# Patient Record
Sex: Female | Born: 1955 | Race: Black or African American | Hispanic: No | Smoking: Former smoker
Health system: Southern US, Community
[De-identification: ages and names within clinical notes are randomized; demographics above are authoritative.]

## PROBLEM LIST (undated history)

## (undated) DIAGNOSIS — S32000A Wedge compression fracture of unspecified lumbar vertebra, initial encounter for closed fracture: Secondary | ICD-10-CM

## (undated) DIAGNOSIS — I10 Essential (primary) hypertension: Secondary | ICD-10-CM

## (undated) DIAGNOSIS — C801 Malignant (primary) neoplasm, unspecified: Secondary | ICD-10-CM

## (undated) HISTORY — DX: Essential (primary) hypertension: I10

## (undated) HISTORY — DX: Wedge compression fracture of unspecified lumbar vertebra, initial encounter for closed fracture: S32.000A

---

## 1999-03-12 ENCOUNTER — Other Ambulatory Visit: Admission: RE | Admit: 1999-03-12 | Discharge: 1999-03-12 | Payer: Self-pay | Admitting: Family Medicine

## 1999-03-12 ENCOUNTER — Ambulatory Visit (HOSPITAL_COMMUNITY): Admission: RE | Admit: 1999-03-12 | Discharge: 1999-03-12 | Payer: Self-pay | Admitting: Family Medicine

## 2005-08-15 ENCOUNTER — Other Ambulatory Visit: Admission: RE | Admit: 2005-08-15 | Discharge: 2005-08-15 | Payer: Self-pay | Admitting: Family Medicine

## 2006-01-13 ENCOUNTER — Ambulatory Visit (HOSPITAL_COMMUNITY): Admission: RE | Admit: 2006-01-13 | Discharge: 2006-01-14 | Payer: Self-pay | Admitting: Neurosurgery

## 2006-12-15 ENCOUNTER — Other Ambulatory Visit: Admission: RE | Admit: 2006-12-15 | Discharge: 2006-12-15 | Payer: Self-pay | Admitting: Family Medicine

## 2007-02-18 HISTORY — PX: BACK SURGERY: SHX140

## 2007-12-21 ENCOUNTER — Ambulatory Visit (HOSPITAL_COMMUNITY): Admission: RE | Admit: 2007-12-21 | Discharge: 2007-12-21 | Payer: Self-pay | Admitting: Obstetrics

## 2010-07-04 ENCOUNTER — Other Ambulatory Visit (HOSPITAL_COMMUNITY)
Admission: RE | Admit: 2010-07-04 | Discharge: 2010-07-04 | Disposition: A | Discharge status: Home / Self Care | Payer: BC Managed Care – PPO | Source: Ambulatory Visit | Attending: Family Medicine | Admitting: Family Medicine

## 2010-07-04 DIAGNOSIS — Z01419 Encounter for gynecological examination (general) (routine) without abnormal findings: Secondary | ICD-10-CM | POA: Insufficient documentation

## 2010-07-04 DIAGNOSIS — Z1159 Encounter for screening for other viral diseases: Secondary | ICD-10-CM | POA: Insufficient documentation

## 2010-07-05 NOTE — Op Note (Signed)
NAMERYDER, CHESMORE                  ACCOUNT NO.:  [id number]   MEDICAL RECORD NO.:  [id number]          PATIENT TYPE:  AMB   LOCATION:  SDS                          FACILITY:  [facility]   PHYSICIAN:  Clydene Fake, M.D.  DATE OF BIRTH:  06/24/1955   DATE OF PROCEDURE:  01/13/2006  DATE OF DISCHARGE:                                 OPERATIVE REPORT   PREOPERATIVE DIAGNOSIS:  Herniated nucleus pulposus, spondylosis, right L5-  S1.   POSTOPERATIVE DIAGNOSIS:  Herniated nucleus pulposus, spondylosis, right L5-  S1.   PROCEDURE:  Right L5-S1 semihemilaminectomy and diskectomy, microdissection  with a microscope.   SURGEON:  Clydene Fake, M.D.   ASSISTANT:  Hewitt Shorts, M.D.   ANESTHESIA:  General endotracheal tube anesthesia.   ESTIMATED BLOOD LOSS:  Minimal.   BLOOD GIVEN:  None.   DRAINS:  None.   COMPLICATIONS:  None.   REASON FOR PROCEDURE:  The patient is a 55 year old, right-handed woman, who  has been having on-and-off back and right leg pain since March.  Over the  last month or so, it has really gotten worse.  An MRI was done, showing a  large disk herniation, right, L5-S1, compressing the S1 root, on top of some  spondylytic changes.  She has some decreased sensation in the right L5 and  S1 distributions to light touch and pinprick, worse at S1, with absence of  right ankle reflex.  The patient was brought in for decompression and  diskectomy.   PROCEDURE IN DETAIL:  The patient was brought into the operating room.  General anesthesia was induced.  The patient was placed in prone position on  the Wilson frame with all pressure points padded.  The patient was prepped  and draped in a sterile fashion and the site of incision was injected with  10 cc of 1% lidocaine with epinephrine.  A needle was placed in the  interspace and x-ray was obtained, showing this was pointing towards the 5-1  disk space, but in between the 4 and 5 spinous processes.  An incision  was  then made and centered below where the needle was, and the incision taken  down to the fascia and hemostasis obtained with Bovie cauterization.  The  fascia was incised with the Bovie and subperiosteal dissection was done over  the S1 and L5 spinous processes and laminae out to the facets.  A self-  retaining retractor was placed.  A marker was placed at the interspace.  Another x-ray was obtained, confirming our positioning at L5-S1.  The  microscope was brought in for microdissection, and at this point a high-  speed drill was used to start a semihemilaminectomy and medial facetectomy.  This was completed with Kerrison punches.  The ligamentum flavum, which was  very thickened, was removed, exposing the dura and the S1 nerve roots, which  were both pushed posteriorly.  At this point, in the epidural space we found  a large subligamentous disk herniation with pieces of disk protruding out  from this.  We entered the disk space and did  diskectomy, and in this way  loosened up the S1 nerve root.  We explored superior to the disk space and  found a few fragments that were pushed up into the L5 roots, superior to the  disk space.  After we removed that, we had good decompression of the area.  We used curets to push the osteophytes and disk material down from the  chronic spondylytic disk bulge.  We used the osteophyte tool and pituitary  rongeurs to continue the diskectomy and decompression.  When we were  finished, we had good decompression of the S1 and L5 nerve roots.  They went  out their foramina well.  We still could feel a small ridge starting at the  midline, but we did not progress further to try to decompress this, since we  really could not visualize any further.  We irrigated with antibiotic  solution and we had good hemostasis.  The retractor was removed and the  fascia closed with 0 Vicryl interrupted sutures.  The subcutaneous tissue  was closed with 0, 2-0 and 3-0 Vicryl  interrupted sutures.  The skin was  closed with benzoin and Steri-Strips.  A dressing was placed.  The patient  was placed back in a supine position, awakened from anesthesia and  transferred to the recovery room in stable condition.           ______________________________  Clydene Fake, M.D.     JRH/MEDQ  D:  01/13/2006  T:  01/13/2006  Job:  [id number]

## 2017-04-02 ENCOUNTER — Other Ambulatory Visit: Payer: Self-pay | Admitting: Physician Assistant

## 2017-04-02 DIAGNOSIS — R9389 Abnormal findings on diagnostic imaging of other specified body structures: Secondary | ICD-10-CM

## 2017-04-21 ENCOUNTER — Ambulatory Visit
Admission: RE | Admit: 2017-04-21 | Discharge: 2017-04-21 | Disposition: A | Discharge status: Home / Self Care | Source: Ambulatory Visit | Attending: Physician Assistant | Admitting: Physician Assistant

## 2017-04-21 DIAGNOSIS — R9389 Abnormal findings on diagnostic imaging of other specified body structures: Secondary | ICD-10-CM

## 2017-04-22 ENCOUNTER — Other Ambulatory Visit (HOSPITAL_COMMUNITY): Payer: Self-pay | Admitting: Physician Assistant

## 2017-04-22 DIAGNOSIS — R918 Other nonspecific abnormal finding of lung field: Secondary | ICD-10-CM

## 2017-04-27 ENCOUNTER — Other Ambulatory Visit: Payer: Self-pay | Admitting: Radiology

## 2017-04-28 ENCOUNTER — Ambulatory Visit (HOSPITAL_COMMUNITY)
Admission: RE | Admit: 2017-04-28 | Discharge: 2017-04-28 | Disposition: A | Discharge status: Home / Self Care | Source: Ambulatory Visit | Attending: Interventional Radiology | Admitting: Interventional Radiology

## 2017-04-28 ENCOUNTER — Ambulatory Visit (HOSPITAL_COMMUNITY)
Admission: RE | Admit: 2017-04-28 | Discharge: 2017-04-28 | Disposition: A | Discharge status: Home / Self Care | Source: Ambulatory Visit | Attending: Physician Assistant | Admitting: Physician Assistant

## 2017-04-28 ENCOUNTER — Encounter (HOSPITAL_COMMUNITY): Payer: Self-pay

## 2017-04-28 DIAGNOSIS — R918 Other nonspecific abnormal finding of lung field: Secondary | ICD-10-CM

## 2017-04-28 DIAGNOSIS — C3411 Malignant neoplasm of upper lobe, right bronchus or lung: Secondary | ICD-10-CM | POA: Diagnosis not present

## 2017-04-28 DIAGNOSIS — J95811 Postprocedural pneumothorax: Secondary | ICD-10-CM

## 2017-04-28 DIAGNOSIS — Z9889 Other specified postprocedural states: Secondary | ICD-10-CM | POA: Diagnosis not present

## 2017-04-28 DIAGNOSIS — F17200 Nicotine dependence, unspecified, uncomplicated: Secondary | ICD-10-CM | POA: Insufficient documentation

## 2017-04-28 LAB — CBC
HEMATOCRIT: 35.5 % — AB (ref 36.0–46.0)
HEMOGLOBIN: 11.6 g/dL — AB (ref 12.0–15.0)
MCH: 27.4 pg (ref 26.0–34.0)
MCHC: 32.7 g/dL (ref 30.0–36.0)
MCV: 83.9 fL (ref 78.0–100.0)
Platelets: 397 10*3/uL (ref 150–400)
RBC: 4.23 MIL/uL (ref 3.87–5.11)
RDW: 16.7 % — ABNORMAL HIGH (ref 11.5–15.5)
WBC: 11.6 10*3/uL — ABNORMAL HIGH (ref 4.0–10.5)

## 2017-04-28 LAB — PROTIME-INR
INR: 1.13
Prothrombin Time: 14.4 seconds (ref 11.4–15.2)

## 2017-04-28 MED ORDER — FENTANYL CITRATE (PF) 100 MCG/2ML IJ SOLN
INTRAMUSCULAR | Status: AC | PRN
  Administered 2017-04-28: 50 ug via INTRAVENOUS

## 2017-04-28 MED ORDER — FENTANYL CITRATE (PF) 100 MCG/2ML IJ SOLN
INTRAMUSCULAR | Status: AC
  Filled 2017-04-28: qty 2

## 2017-04-28 MED ORDER — SODIUM CHLORIDE 0.9 % IV SOLN: INTRAVENOUS | Status: DC

## 2017-04-28 MED ORDER — MIDAZOLAM HCL 2 MG/2ML IJ SOLN
INTRAMUSCULAR | Status: AC
  Filled 2017-04-28: qty 2

## 2017-04-28 MED ORDER — LIDOCAINE HCL 1 % IJ SOLN
INTRAMUSCULAR | Status: AC
  Filled 2017-04-28: qty 20

## 2017-04-28 MED ORDER — MIDAZOLAM HCL 2 MG/2ML IJ SOLN
INTRAMUSCULAR | Status: AC | PRN
  Administered 2017-04-28: 1 mg via INTRAVENOUS

## 2017-04-28 NOTE — H&P (Signed)
Chief Complaint: Patient was seen in consultation today for right upper lobe lung mass at the request of [place]  Referring Physician(s): McKean  Supervising Physician: Corrie Mckusick  Patient Status: [name] - Out-pt  History of Present Illness: Ashley Wilson is a 62 y.o. female   Current smoker since age 28 Was seen by PCP for swelling of the hands, was noted to have clubbing nails Work ensued  Hx right upper lobe lung mass found on CT chest 04/21/2017: 8.9 cm a lobulated right apical/upper lobe mass most compatible with primary lung cancer. Mild mediastinal adenopathy. These findings could be further evaluated with PET CT and/or tissue diagnosis. 7.6 cm cystic lesion off the upper pole of the left kidney, likely a benign minimally complex cyst but this cannot be fully characterized without intravenous contrast.  Plan for image guided right upper lobe lung mass biopsy per request of Dr. [name].  History reviewed. No pertinent past medical history.  History reviewed. No pertinent surgical history.  Allergies: Patient has no known allergies.  Medications: Prior to Admission medications   Medication Sig Start Date End Date Taking? Authorizing Provider  acetaminophen (TYLENOL) 500 MG tablet Take 1,000 mg by mouth every 6 (six) hours as needed for mild pain or moderate pain.   Yes [provider]  amLODipine (NORVASC) 10 MG tablet Take 10 mg by mouth daily.   Yes [provider]  cetirizine (ZYRTEC) 10 MG tablet Take 10 mg by mouth daily as needed for allergies.   Yes [provider]  spironolactone (ALDACTONE) 25 MG tablet Take 25 mg by mouth daily.   Yes [provider]     History reviewed. No pertinent family history.  Social History   Socioeconomic History  . Marital status: Married    Spouse name: None  . Number of children: None  . Years of education: None  . Highest education level: None  Social Needs  .  Financial resource strain: None  . Food insecurity - worry: None  . Food insecurity - inability: None  . Transportation needs - medical: None  . Transportation needs - non-medical: None  Occupational History  . None  Tobacco Use  . Smoking status: Current Every Day Smoker    Start date: 23  Substance and Sexual Activity  . Alcohol use: None  . Drug use: None  . Sexual activity: None  Other Topics Concern  . None  Social History Narrative  . None    Review of Systems: A 12 point ROS discussed and pertinent positives are indicated in the HPI above.  All other systems are negative.  Review of Systems  Constitutional: Negative for chills and fever.  Respiratory: Negative for cough, shortness of breath and wheezing.   Cardiovascular: Negative for chest pain and palpitations.  Gastrointestinal: Negative for nausea and vomiting.  Neurological: Negative for dizziness and numbness.  Psychiatric/Behavioral: Negative for behavioral problems and confusion.    Vital Signs: BP 132/77   Pulse 99   Temp 98.6 F (37 C)   Resp 18   Ht 5\' 4"  (1.626 m)   Wt 166 lb (75.3 kg)   SpO2 99%   BMI 28.49 kg/m   Physical Exam  Constitutional: She is oriented to person, place, and time. She appears well-developed and well-nourished.  Cardiovascular: Normal rate, regular rhythm and normal heart sounds.  No murmur heard. Pulmonary/Chest: Effort normal and breath sounds normal. She has no wheezes.  Musculoskeletal: Normal range of motion.  Neurological: She  is alert and oriented to person, place, and time.  Skin: Skin is warm and dry.  Psychiatric: She has a normal mood and affect. Her behavior is normal. Judgment and thought content normal.  Nursing note and vitals reviewed.   Imaging: Ct Chest Wo Contrast  Result Date: 04/22/2017 CLINICAL DATA:  Right upper lung mass. EXAM: CT CHEST WITHOUT CONTRAST TECHNIQUE: Multidetector CT imaging of the chest was performed following the standard  protocol without IV contrast. COMPARISON:  Chest x-ray 01/12/2006. No recent films or studies available. FINDINGS: Cardiovascular: Heart is normal size. Scattered moderate coronary artery calcifications, most pronounced in the left anterior descending coronary artery. Scattered aortic calcifications. No aneurysm. Mediastinum/Nodes: Borderline and mildly enlarged mediastinal lymph nodes. The largest lymph node is a right paratracheal lymph node measuring 13 mm in short axis diameter. Borderline sized AP window, prevascular lymph nodes. No visible hilar adenopathy although evaluation of the hila is difficult without intravenous contrast. No axillary adenopathy. Lungs/Pleura: Large right upper lobe mass in the posterior right apex measures 8.9 x 8.0 cm and is concerning for primary lung cancer. Areas of scarring or atelectasis in both lower lobes and lingula. No effusions. Upper Abdomen: Large exophytic cystic mass off the upper pole of the left kidney, 7.6 cm with peripheral calcifications. This cannot be fully characterized without intravenous contrast. Mild fullness of the adrenal glands bilaterally most likely hyperplasia. Musculoskeletal: Chest wall soft tissues are unremarkable. No acute bony abnormality. IMPRESSION: 8.9 cm a lobulated right apical/upper lobe mass most compatible with primary lung cancer. Mild mediastinal adenopathy. These findings could be further evaluated with PET CT and/or tissue diagnosis. 7.6 cm cystic lesion off the upper pole of the left kidney, likely a benign minimally complex cyst but this cannot be fully characterized without intravenous contrast. Coronary artery disease Aortic Atherosclerosis (ICD10-I70.0). Electronically Signed   By: Rolm Baptise M.D.   On: 04/22/2017 08:08    Labs:  CBC: Recent Labs    04/28/17 0918  WBC 11.6*  HGB 11.6*  HCT 35.5*  PLT 397    COAGS: No results for input(s): INR, APTT in the last 8760 hours.  BMP: No results for input(s): NA, K,  CL, CO2, GLUCOSE, BUN, CALCIUM, CREATININE, GFRNONAA, GFRAA in the last 8760 hours.  Invalid input(s): CMP  LIVER FUNCTION TESTS: No results for input(s): BILITOT, AST, ALT, ALKPHOS, PROT, ALBUMIN in the last 8760 hours.  TUMOR MARKERS: No results for input(s): AFPTM, CEA, CA199, CHROMGRNA in the last 8760 hours.  Assessment and Plan:  Plan for image guided right upper lobe lung mass biopsy today.  Risks and benefits discussed with the patient including, but not limited to bleeding, hemoptysis, respiratory failure requiring intubation, infection, pneumothorax requiring chest tube placement, stroke from air embolism or even death. All of the patient's questions were answered, patient is agreeable to proceed. Consent signed and in chart.  Thank you for this interesting consult.  I greatly enjoyed meeting JANYIAH SILVERI and look forward to participating in their care.  A copy of this report was sent to the requesting provider on this date.  Electronically Signed: Lavonia Drafts, PA-C 04/28/2017, 10:31 AM   I spent a total of 30 Minutes in face to face in clinical consultation, greater than 50% of which was counseling/coordinating care for right upper lobe lung mass biopsy.

## 2017-04-28 NOTE — Sedation Documentation (Signed)
Vital signs stable. 

## 2017-04-28 NOTE — Progress Notes (Signed)
Called triage nurse Whitney about CXR results and she notified Dr Jacqualyn Posey

## 2017-04-28 NOTE — Discharge Instructions (Signed)
Needle Biopsy of the Lung, Care After °This sheet gives you information about how to care for yourself after your procedure. Your health care provider may also give you more specific instructions. If you have problems or questions, contact your health care provider. °What can I expect after the procedure? °After the procedure, it is common to have: °· Soreness, pain, and tenderness where a tissue sample was taken (biopsy site). °· A cough. °· A sore throat. ° °Follow these instructions at home: °Biopsy site care °· Follow instructions from your health care provider about when to remove the bandage that was placed on the biopsy site. °· Keep the bandage dry until it has been removed. °· Check your biopsy site every day for signs of infection. Check for: °? More redness, swelling, or pain. °? More fluid or blood. °? Warmth to the touch. °? Pus or a bad smell. °General instructions °· Rest as directed by your health care provider. Ask your health care provider what activities are safe for you. °· Do not take baths, swim, or use a hot tub until your health care provider approves. °· Take over-the-counter and prescription medicines only as told by your health care provider. °· If you have airplane travel scheduled, talk with your health care provider about when it is safe for you to travel by airplane. °· It is up to you to get the results of your procedure. Ask your health care provider, or the department that is doing the procedure, when your results will be ready. °· Keep all follow-up visits as told by your health care provider. This is important. °Contact a health care provider if: °· You have more redness, swelling, or pain around your biopsy site. °· You have more fluid or blood coming from your biopsy site. °· Your biopsy site feels warm to the touch. °· You have pus or a bad smell coming from your biopsy site. °· You have a fever. °· You have pain that does not get better with medicine. °Get help right away  if: °· You have problems breathing. °· You have chest pain. °· You cough up blood. °· You faint. °· You have a fast heart rate. °Summary °· After a needle biopsy of the lung, it is common to have a cough, a sore throat, or soreness, pain, and tenderness where a tissue sample was taken (biopsy site). °· You should check your biopsy area every day for signs of infection, including pus or a bad smell, warmth, more fluid or blood, or more redness, swelling, or pain. °· You should not take baths, swim, or use a hot tub until your health care provider approves. °· It is up to you to get the results of your procedure. Ask your health care provider, or the department that is doing the procedure, when your results will be ready. °This information is not intended to replace advice given to you by your health care provider. Make sure you discuss any questions you have with your health care provider. °Document Released: 12/01/2006 Document Revised: 12/26/2015 Document Reviewed: 12/26/2015 °Elsevier Interactive Patient Education © 2017 [employer] ° ° °

## 2017-04-28 NOTE — Progress Notes (Signed)
Paged Dr Jacqualyn Posey for CXR results

## 2017-04-28 NOTE — Progress Notes (Signed)
Called Triage nurse Amy and she will have Dr Jacqualyn Posey call with CXR results

## 2017-04-28 NOTE — Progress Notes (Signed)
Call from Dr Jacqualyn Posey and [place] to d/c client home now

## 2017-04-28 NOTE — Sedation Documentation (Signed)
Patient denies pain and is resting comfortably.  

## 2017-04-28 NOTE — Procedures (Signed)
Interventional Radiology Procedure Note  Procedure: CT guided biopsy of RUL lung mass Complications: None Recommendations: - Bedrest until CXR cleared.  Minimize talking, coughing or otherwise straining.  - Follow up 1 hr CXR pending  - NPO until CXR cleared  Signed,  Corrie Mckusick, DO

## 2017-05-04 ENCOUNTER — Telehealth: Payer: Self-pay | Admitting: *Deleted

## 2017-05-04 DIAGNOSIS — R918 Other nonspecific abnormal finding of lung field: Secondary | ICD-10-CM

## 2017-05-04 NOTE — Telephone Encounter (Signed)
Oncology Nurse Navigator Documentation  Oncology Nurse Navigator Flowsheets 05/04/2017  Navigator Location [facility]  Navigator Encounter Type Telephone/I called to update on appt time and place.  I called PCP office to update on referral and new dx.  They will reach out to the patient and family with more information.   Telephone Incoming Call  Treatment Phase Pre-Tx/Tx Discussion  Barriers/Navigation Needs Coordination of Care;Education  Education Other  Interventions Coordination of Care;Education  Coordination of Care Appts  Acuity Level 2  Time Spent with Patient 30

## 2017-05-04 NOTE — Telephone Encounter (Signed)
Oncology Nurse Navigator Documentation  Oncology Nurse Navigator Flowsheets 05/04/2017  Navigator Location [facility]  Referral date to RadOnc/MedOnc 05/01/2017  Navigator Encounter Type Telephone/I received referral on Ashley Wilson. I tried to call and schedule but phone would not take my call. I called emergency contact and asked them to call me with my name and phone number.   Telephone Outgoing Call  Treatment Phase Abnormal Scans  Barriers/Navigation Needs Coordination of Care  Interventions Coordination of Care  Coordination of Care Other  Acuity Level 2  Time Spent with Patient 30

## 2017-05-07 ENCOUNTER — Telehealth: Payer: Self-pay | Admitting: *Deleted

## 2017-05-07 NOTE — Telephone Encounter (Signed)
Oncology Nurse Navigator Documentation  Oncology Nurse Navigator Flowsheets 05/07/2017  Navigator Location [facility]  Navigator Encounter Type Telephone/patient's daughter called. She wanted to get clarification on appt. I called and updated.   Telephone Outgoing Call;Incoming Call  Treatment Phase Pre-Tx/Tx Discussion  Barriers/Navigation Needs Education  Education Other  Interventions Education  Education Method Verbal  Acuity Level 2  Time Spent with Patient 30

## 2017-05-08 ENCOUNTER — Telehealth: Payer: Self-pay | Admitting: Internal Medicine

## 2017-05-08 ENCOUNTER — Encounter: Payer: Self-pay | Admitting: *Deleted

## 2017-05-08 ENCOUNTER — Inpatient Hospital Stay: Attending: Internal Medicine | Admitting: Internal Medicine

## 2017-05-08 ENCOUNTER — Inpatient Hospital Stay

## 2017-05-08 VITALS — BP 132/58 | HR 70 | Temp 99.1°F | Resp 18 | Ht 63.0 in | Wt 162.9 lb

## 2017-05-08 DIAGNOSIS — F17210 Nicotine dependence, cigarettes, uncomplicated: Secondary | ICD-10-CM | POA: Diagnosis not present

## 2017-05-08 DIAGNOSIS — R200 Anesthesia of skin: Secondary | ICD-10-CM | POA: Diagnosis not present

## 2017-05-08 DIAGNOSIS — I700 Atherosclerosis of aorta: Secondary | ICD-10-CM | POA: Diagnosis not present

## 2017-05-08 DIAGNOSIS — Z79899 Other long term (current) drug therapy: Secondary | ICD-10-CM

## 2017-05-08 DIAGNOSIS — Z833 Family history of diabetes mellitus: Secondary | ICD-10-CM | POA: Diagnosis not present

## 2017-05-08 DIAGNOSIS — C3490 Malignant neoplasm of unspecified part of unspecified bronchus or lung: Secondary | ICD-10-CM

## 2017-05-08 DIAGNOSIS — G8918 Other acute postprocedural pain: Secondary | ICD-10-CM | POA: Diagnosis not present

## 2017-05-08 DIAGNOSIS — R600 Localized edema: Secondary | ICD-10-CM

## 2017-05-08 DIAGNOSIS — Z87311 Personal history of (healed) other pathological fracture: Secondary | ICD-10-CM

## 2017-05-08 DIAGNOSIS — R05 Cough: Secondary | ICD-10-CM | POA: Insufficient documentation

## 2017-05-08 DIAGNOSIS — Z8249 Family history of ischemic heart disease and other diseases of the circulatory system: Secondary | ICD-10-CM | POA: Insufficient documentation

## 2017-05-08 DIAGNOSIS — Z716 Tobacco abuse counseling: Secondary | ICD-10-CM

## 2017-05-08 DIAGNOSIS — R918 Other nonspecific abnormal finding of lung field: Secondary | ICD-10-CM

## 2017-05-08 DIAGNOSIS — C3411 Malignant neoplasm of upper lobe, right bronchus or lung: Secondary | ICD-10-CM | POA: Diagnosis present

## 2017-05-08 DIAGNOSIS — R5383 Other fatigue: Secondary | ICD-10-CM

## 2017-05-08 DIAGNOSIS — I10 Essential (primary) hypertension: Secondary | ICD-10-CM | POA: Insufficient documentation

## 2017-05-08 DIAGNOSIS — R634 Abnormal weight loss: Secondary | ICD-10-CM | POA: Diagnosis not present

## 2017-05-08 LAB — CMP (CANCER CENTER ONLY)
ALBUMIN: 2.8 g/dL — AB (ref 3.5–5.0)
ALT: <6 U/L (ref 0–55)
ANION GAP: 12 — AB (ref 3–11)
AST: 7 U/L (ref 5–34)
Alkaline Phosphatase: 176 U/L — ABNORMAL HIGH (ref 40–150)
BUN: 8 mg/dL (ref 7–26)
CALCIUM: 9.4 mg/dL (ref 8.4–10.4)
CO2: 23 mmol/L (ref 22–29)
Chloride: 107 mmol/L (ref 98–109)
Creatinine: 0.84 mg/dL (ref 0.60–1.10)
GFR, Estimated: >60 mL/min (ref >60)
GLUCOSE: 164 mg/dL — AB (ref 70–140)
POTASSIUM: 3.5 mmol/L (ref 3.5–5.1)
SODIUM: 142 mmol/L (ref 136–145)
TOTAL PROTEIN: 7.6 g/dL (ref 6.4–8.3)
Total Bilirubin: 0.2 mg/dL (ref 0.2–1.2)

## 2017-05-08 LAB — CBC WITH DIFFERENTIAL (CANCER CENTER ONLY)
BASOS PCT: 1 %
Basophils Absolute: 0.1 10*3/uL (ref 0.0–0.1)
EOS ABS: 0.1 10*3/uL (ref 0.0–0.5)
EOS PCT: 1 %
HCT: 35.4 % (ref 34.8–46.6)
Hemoglobin: 11.6 g/dL (ref 11.6–15.9)
LYMPHS ABS: 1.9 10*3/uL (ref 0.9–3.3)
Lymphocytes Relative: 21 %
MCH: 26.9 pg (ref 25.1–34.0)
MCHC: 32.8 g/dL (ref 31.5–36.0)
MCV: 81.8 fL (ref 79.5–101.0)
MONO ABS: 0.5 10*3/uL (ref 0.1–0.9)
Monocytes Relative: 6 %
Neutro Abs: 6.7 10*3/uL — ABNORMAL HIGH (ref 1.5–6.5)
Neutrophils Relative %: 71 %
Platelet Count: 571 10*3/uL — ABNORMAL HIGH (ref 145–400)
RBC: 4.32 MIL/uL (ref 3.70–5.45)
RDW: 15.6 % — AB (ref 11.2–14.5)
WBC Count: 9.3 10*3/uL (ref 3.9–10.3)

## 2017-05-08 NOTE — Telephone Encounter (Signed)
Scheduled appt per 3/22 los- Gave patient AVS and calender per los. Central radiology to contact patient with MRI and PET scan schedule.

## 2017-05-08 NOTE — Progress Notes (Signed)
[place] [phone]   [phone]  CONSULT NOTE  REFERRING PHYSICIAN: Marella Chimes, PA  REASON FOR CONSULTATION:  62 years old [name] recently diagnosed with lung cancer.  HPI Ashley Wilson is a 63 y.o. female with past medical history significant for hypertension as well as compression fracture of the lumbar spines.  The patient mentioned that she has been complaining of swelling of her ankles and leg since June 2018.  She was evaluated initially by her primary care physician and she was referred to rheumatologist because of the swelling and aching pain in the hands.  During her examination she was found to have clubbing of her nails.  The patient had a chest x-ray performed at her rheumatologist's office that was concerning for lung mass.  This was followed by CT scan of the chest on 04/22/2014 and that showed large right upper lobe mass in the posterior right apex measuring 8.9 x 8.0 cm concerning for primary lung cancer.  There was also borderline and mildly enlarged mediastinal lymph nodes.  The largest lymph node is a right paratracheal lymph node measuring 1.3 cm in short axis diameter.  There was also a border line sized AP window, and prevascular lymph nodes.  The scan also showed 7.6 cm cystic lesion of the upper pole of the left kidney likely benign minimally complex cyst.  On April 28, 2017 the patient underwent CT-guided core biopsy of the large right upper lobe lung mass by interventional radiology. The final pathology ([id number]) was consistent with adenocarcinoma.  The neoplastic cells were positive for TTF-1 but negative for cytokeratin 5/6.  The overall features are consistent with an adenocarcinoma. The patient was referred to me today for further evaluation and recommendation regarding treatment of her condition. When seen today she is very anxious and worried about her recent diagnosis.  She also continues to have mild  soreness at the right side of the chest after the biopsy.  She has numbness of the right toe secondary to previous compression fracture of the lumbar spines.  The patient denied having any significant shortness of breath but has cough productive of clear sputum.  She lost around 5 pounds in the last few weeks.  She denied having any nausea, vomiting, diarrhea or constipation.  She denied having any fever or chills.  She has no headache or visual changes. Family history significant for mother with heart disease and hypertension, father had diabetes and heart disease and brother had bone cancer. The patient is a widow and has 3 children.  She was accompanied today by her grand daughter and her 2 daughters were available by phone during the discussion.  The patient is currently retired and used to work in Restaurant manager, fast food.  She has a history for smoking 1 pack/day for around 35 years and quit April 28, 2017.  She also drinks alcohol at regular basis.  She has no history of drug abuse.   HPI  Past Medical History:  Diagnosis Date  . Compression fracture of lumbar vertebra ([place])   . Hypertension     History reviewed. No pertinent surgical history.  Family History  Problem Relation Age of Onset  . Hypertension Mother   . CAD Mother   . Diabetes Father   . CAD Father   . Cancer Brother     Social History Social History   Tobacco Use  . Smoking status: Former Smoker    Years: 35.00    Types:  Cigarettes    Start date: 73    Last attempt to quit: 04/28/2017    Years since quitting: 0.0  . Smokeless tobacco: Never Used  Substance Use Topics  . Alcohol use: Yes  . Drug use: Not on file    No Known Allergies  Current Outpatient Medications  Medication Sig Dispense Refill  . acetaminophen (TYLENOL) 500 MG tablet Take 1,000 mg by mouth every 6 (six) hours as needed for mild pain or moderate pain.    Marland Kitchen amLODipine (NORVASC) 10 MG tablet Take 10 mg by mouth daily.    . cetirizine (ZYRTEC)  10 MG tablet Take 10 mg by mouth daily as needed for allergies.    Marland Kitchen spironolactone (ALDACTONE) 25 MG tablet Take 25 mg by mouth daily.     No current facility-administered medications for this visit.     Review of Systems  Constitutional: positive for fatigue and weight loss Eyes: negative Ears, nose, mouth, throat, and face: negative Respiratory: positive for cough, pleurisy/chest pain and sputum Cardiovascular: negative Gastrointestinal: negative Genitourinary:negative Integument/breast: negative Hematologic/lymphatic: negative Musculoskeletal:negative Neurological: negative Behavioral/Psych: negative Endocrine: negative Allergic/Immunologic: negative  Physical Exam  CHY:IFOYD, healthy, no distress, well nourished, well developed and anxious SKIN: skin color, texture, turgor are normal, no rashes or significant lesions HEAD: Normocephalic, No masses, lesions, tenderness or abnormalities EYES: normal, PERRLA, Conjunctiva are pink and non-injected EARS: External ears normal, Canals clear OROPHARYNX:no exudate, no erythema and lips, buccal mucosa, and tongue normal  NECK: supple, no adenopathy, no JVD LYMPH:  no palpable lymphadenopathy, no hepatosplenomegaly BREAST:not examined LUNGS: clear to auscultation , and palpation HEART: regular rate & rhythm, no murmurs and no gallops ABDOMEN:abdomen soft, non-tender, normal bowel sounds and no masses or organomegaly BACK: No CVA tenderness, Range of motion is normal EXTREMITIES:no joint deformities, effusion, or inflammation, no edema, no skin discoloration  NEURO: alert & oriented x 3 with fluent speech, no focal motor/sensory deficits  PERFORMANCE STATUS: ECOG 1  LABORATORY DATA: Lab Results  Component Value Date   WBC 9.3 05/08/2017   HGB 11.6 (L) 04/28/2017   HCT 35.4 05/08/2017   MCV 81.8 05/08/2017   PLT 571 (H) 05/08/2017      Chemistry   No results found for: NA, K, CL, CO2, BUN, CREATININE, GLU No results  found for: CALCIUM, ALKPHOS, AST, ALT, BILITOT     RADIOGRAPHIC STUDIES: Ct Chest Wo Contrast  Result Date: 04/22/2017 CLINICAL DATA:  Right upper lung mass. EXAM: CT CHEST WITHOUT CONTRAST TECHNIQUE: Multidetector CT imaging of the chest was performed following the standard protocol without IV contrast. COMPARISON:  Chest x-ray 01/12/2006. No recent films or studies available. FINDINGS: Cardiovascular: Heart is normal size. Scattered moderate coronary artery calcifications, most pronounced in the left anterior descending coronary artery. Scattered aortic calcifications. No aneurysm. Mediastinum/Nodes: Borderline and mildly enlarged mediastinal lymph nodes. The largest lymph node is a right paratracheal lymph node measuring 13 mm in short axis diameter. Borderline sized AP window, prevascular lymph nodes. No visible hilar adenopathy although evaluation of the hila is difficult without intravenous contrast. No axillary adenopathy. Lungs/Pleura: Large right upper lobe mass in the posterior right apex measures 8.9 x 8.0 cm and is concerning for primary lung cancer. Areas of scarring or atelectasis in both lower lobes and lingula. No effusions. Upper Abdomen: Large exophytic cystic mass off the upper pole of the left kidney, 7.6 cm with peripheral calcifications. This cannot be fully characterized without intravenous contrast. Mild fullness of the adrenal glands bilaterally most likely  hyperplasia. Musculoskeletal: Chest wall soft tissues are unremarkable. No acute bony abnormality. IMPRESSION: 8.9 cm a lobulated right apical/upper lobe mass most compatible with primary lung cancer. Mild mediastinal adenopathy. These findings could be further evaluated with PET CT and/or tissue diagnosis. 7.6 cm cystic lesion off the upper pole of the left kidney, likely a benign minimally complex cyst but this cannot be fully characterized without intravenous contrast. Coronary artery disease Aortic Atherosclerosis (ICD10-I70.0).  Electronically Signed   By: Rolm Baptise M.D.   On: 04/22/2017 08:08   Ct Biopsy  Result Date: 04/28/2017 INDICATION: 62 year old female with a history of right upper lobe lung mass. EXAM: CT BIOPSY MEDICATIONS: None. ANESTHESIA/SEDATION: Moderate (conscious) sedation was employed during this procedure. A total of Versed 1.0 mg and Fentanyl 100 mcg was administered intravenously. Moderate Sedation Time: 10 minutes. The patient's level of consciousness and vital signs were monitored continuously by radiology nursing throughout the procedure under my direct supervision. FLUOROSCOPY TIME:  CT COMPLICATIONS: None PROCEDURE: The procedure, risks, benefits, and alternatives were explained to the patient and the patient's family. Specific risks that were addressed included bleeding, infection, pneumothorax, need for further procedure including chest tube placement, chance of delayed pneumothorax or hemorrhage, hemoptysis, nondiagnostic sample, cardiopulmonary collapse, death. Questions regarding the procedure were encouraged and answered. The patient understands and consents to the procedure. Patient was positioned in the prone position on the CT gantry table and a scout CT of the chest was performed for planning purposes. Once angle of approach was determined, the skin and subcutaneous tissues this scan was prepped and draped in the usual sterile fashion, and a sterile drape was applied covering the operative field. A sterile gown and sterile gloves were used for the procedure. Local anesthesia was provided with 1% Lidocaine. The skin and subcutaneous tissues were infiltrated 1% lidocaine for local anesthesia, and a small stab incision was made with an 11 blade scalpel. Using CT guidance, a 17 gauge trocar needle was advanced into the right upper lobetarget. After confirmation of the tip, separate 18 gauge core biopsies were performed. These were placed into solution for transportation to the lab. Biosentry Device was  deployed. A final CT image was performed. Patient tolerated the procedure well and remained hemodynamically stable throughout. No complications were encountered and no significant blood loss was encounter IMPRESSION: Status post CT-guided biopsy of right upper lobe mass. Tissue specimen sent to pathology for complete histopathologic analysis. Signed, Dulcy Fanny. Earleen Newport, DO [facility] Electronically Signed   By: Corrie Mckusick D.O.   On: 04/28/2017 12:37   Dg Chest Port 1 View  Result Date: 04/28/2017 CLINICAL DATA:  62 year old female with a history of recent lung biopsy EXAM: PORTABLE CHEST 1 VIEW COMPARISON:  CT 04/28/2017, chest x-ray 01/12/2006 FINDINGS: Cardiomediastinal silhouette unchanged. No evidence of central vascular congestion. No pneumothorax. No pleural effusion. No new confluent airspace disease. Right upper lobe lung mass, better characterized on recent CT chest. IMPRESSION: No pneumothorax status post right lung mass biopsy. Electronically Signed   By: Corrie Mckusick D.O.   On: 04/28/2017 15:56    ASSESSMENT: This is a very pleasant 62 years old [name] recently diagnosed with a stage IIIA/B (T3, N2, M0) non-small cell lung cancer, adenocarcinoma diagnosed in March 2019 and presented with large right upper lobe lung mass in addition to right hilar and mediastinal lymphadenopathy, pending further staging work-up.  PLAN: I had a lengthy discussion with the patient and her granddaughter today about her  current disease stage, prognosis and treatment options.  I personally and independently reviewed the scan images and discussed the results and showed the images to the patient and her granddaughter. I recommended for the patient to complete the staging workup for her disease by ordering a PET scan as well as MRI of the brain to rule out any metastatic lesions. If the patient has no evidence for metastatic disease, she  would be considered for a course of concurrent chemoradiation with weekly carboplatin for AUC of 2 and paclitaxel 45 mg/M2 with radiation followed by consolidation immunotherapy if she has no evidence for disease progression. I will arrange for the patient to come back for follow-up visit in around 10 days for further discussion of her treatment options based on the final staging workup. For smoking cessation, I strongly encouraged the patient to continue quitting smoking. The patient was advised to call immediately if she has any concerning symptoms in the interval.  The patient voices understanding of current disease status and treatment options and is in agreement with the current care plan.  All questions were answered. The patient knows to call the clinic with any problems, questions or concerns. We can certainly see the patient much sooner if necessary.  Thank you so much for allowing me to participate in the care of Storm Frisk. I will continue to follow up the patient with you and assist in her care.  I spent 40 minutes counseling the patient face to face. The total time spent in the appointment was 60 minutes.  Disclaimer: This note was dictated with voice recognition software. Similar sounding words can inadvertently be transcribed and may not be corrected upon review.   Eilleen Kempf May 08, 2017, 9:06 AM

## 2017-05-08 NOTE — Progress Notes (Signed)
Oncology Nurse Navigator Documentation  Oncology Nurse Navigator Flowsheets 05/08/2017  Navigator Location [facility]  Navigator Encounter Type Clinic/MDC;Other/I spoke with patient and daughter today.  Patient has lung mass without tissue DX.  I will update authorization to help get scans authorized and scheduled.  Per Dr. Julien Nordmann, I will contact pathology to send recent pathology for foundation one and PDL 1 testing.   Abnormal Finding Date 04/22/2017  Patient Visit Type MedOnc  Treatment Phase Abnormal Scans  Barriers/Navigation Needs Coordination of Care;Education  Education Other  Interventions Coordination of Care;Education  Coordination of Care Other  Education Method Verbal  Acuity Level 2  Time Spent with Patient 30

## 2017-05-09 ENCOUNTER — Encounter: Payer: Self-pay | Admitting: Internal Medicine

## 2017-05-20 ENCOUNTER — Ambulatory Visit: Admitting: Oncology

## 2017-05-20 ENCOUNTER — Telehealth: Payer: Self-pay | Admitting: Emergency Medicine

## 2017-05-20 NOTE — Telephone Encounter (Signed)
Spoke with patient. Appointment time changed from today to Friday at 9:30am per Erasmo Downer NP request

## 2017-05-21 ENCOUNTER — Ambulatory Visit (HOSPITAL_COMMUNITY)
Admission: RE | Admit: 2017-05-21 | Discharge: 2017-05-21 | Disposition: A | Discharge status: Home / Self Care | Source: Ambulatory Visit | Attending: Internal Medicine | Admitting: Internal Medicine

## 2017-05-21 DIAGNOSIS — N281 Cyst of kidney, acquired: Secondary | ICD-10-CM | POA: Insufficient documentation

## 2017-05-21 DIAGNOSIS — K5730 Diverticulosis of large intestine without perforation or abscess without bleeding: Secondary | ICD-10-CM | POA: Insufficient documentation

## 2017-05-21 DIAGNOSIS — D259 Leiomyoma of uterus, unspecified: Secondary | ICD-10-CM | POA: Insufficient documentation

## 2017-05-21 DIAGNOSIS — J439 Emphysema, unspecified: Secondary | ICD-10-CM | POA: Diagnosis not present

## 2017-05-21 DIAGNOSIS — R918 Other nonspecific abnormal finding of lung field: Secondary | ICD-10-CM | POA: Diagnosis not present

## 2017-05-21 DIAGNOSIS — R590 Localized enlarged lymph nodes: Secondary | ICD-10-CM | POA: Insufficient documentation

## 2017-05-21 DIAGNOSIS — I2510 Atherosclerotic heart disease of native coronary artery without angina pectoris: Secondary | ICD-10-CM | POA: Insufficient documentation

## 2017-05-21 DIAGNOSIS — C3490 Malignant neoplasm of unspecified part of unspecified bronchus or lung: Secondary | ICD-10-CM | POA: Diagnosis not present

## 2017-05-21 DIAGNOSIS — I6782 Cerebral ischemia: Secondary | ICD-10-CM | POA: Diagnosis not present

## 2017-05-21 LAB — GLUCOSE, CAPILLARY: GLUCOSE-CAPILLARY: 126 mg/dL — AB (ref 65–99)

## 2017-05-21 MED ORDER — FLUDEOXYGLUCOSE F - 18 (FDG) INJECTION
8.0000 | Freq: Once | INTRAVENOUS | Status: AC | PRN
  Administered 2017-05-21: 8 via INTRAVENOUS

## 2017-05-21 MED ORDER — GADOBENATE DIMEGLUMINE 529 MG/ML IV SOLN
15.0000 mL | Freq: Once | INTRAVENOUS | Status: AC | PRN
  Administered 2017-05-21: 15 mL via INTRAVENOUS

## 2017-05-22 ENCOUNTER — Other Ambulatory Visit: Payer: Self-pay | Admitting: Internal Medicine

## 2017-05-22 ENCOUNTER — Telehealth: Payer: Self-pay | Admitting: Internal Medicine

## 2017-05-22 ENCOUNTER — Encounter: Payer: Self-pay | Admitting: Oncology

## 2017-05-22 ENCOUNTER — Encounter: Payer: Self-pay | Admitting: Radiation Oncology

## 2017-05-22 ENCOUNTER — Inpatient Hospital Stay: Attending: Internal Medicine | Admitting: Oncology

## 2017-05-22 VITALS — BP 146/64 | HR 79 | Temp 98.0°F | Resp 18 | Ht 63.0 in | Wt 163.7 lb

## 2017-05-22 DIAGNOSIS — C3411 Malignant neoplasm of upper lobe, right bronchus or lung: Secondary | ICD-10-CM | POA: Insufficient documentation

## 2017-05-22 DIAGNOSIS — R5383 Other fatigue: Secondary | ICD-10-CM | POA: Diagnosis not present

## 2017-05-22 DIAGNOSIS — Z7189 Other specified counseling: Secondary | ICD-10-CM | POA: Insufficient documentation

## 2017-05-22 DIAGNOSIS — R0602 Shortness of breath: Secondary | ICD-10-CM | POA: Insufficient documentation

## 2017-05-22 DIAGNOSIS — Z5111 Encounter for antineoplastic chemotherapy: Secondary | ICD-10-CM | POA: Insufficient documentation

## 2017-05-22 DIAGNOSIS — R600 Localized edema: Secondary | ICD-10-CM

## 2017-05-22 DIAGNOSIS — I10 Essential (primary) hypertension: Secondary | ICD-10-CM | POA: Diagnosis not present

## 2017-05-22 DIAGNOSIS — Z79899 Other long term (current) drug therapy: Secondary | ICD-10-CM | POA: Diagnosis not present

## 2017-05-22 DIAGNOSIS — C3490 Malignant neoplasm of unspecified part of unspecified bronchus or lung: Secondary | ICD-10-CM

## 2017-05-22 MED ORDER — PROCHLORPERAZINE MALEATE 10 MG PO TABS: 10.0000 mg | ORAL_TABLET | Freq: Four times a day (QID) | ORAL | 0 refills | Status: DC | PRN

## 2017-05-22 NOTE — Patient Instructions (Signed)
Carboplatin injection What is this medicine? CARBOPLATIN (KAR boe pla tin) is a chemotherapy drug. It targets fast dividing cells, like cancer cells, and causes these cells to die. This medicine is used to treat ovarian cancer and many other cancers. This medicine may be used for other purposes; ask your health care provider or pharmacist if you have questions. COMMON BRAND NAME(S): Paraplatin What should I tell my health care provider before I take this medicine? They need to know if you have any of these conditions: -blood disorders -hearing problems -kidney disease -recent or ongoing radiation therapy -an unusual or allergic reaction to carboplatin, cisplatin, other chemotherapy, other medicines, foods, dyes, or preservatives -pregnant or trying to get pregnant -breast-feeding How should I use this medicine? This drug is usually given as an infusion into a vein. It is administered in a hospital or clinic by a specially trained health care professional. Talk to your pediatrician regarding the use of this medicine in children. Special care may be needed. Overdosage: If you think you have taken too much of this medicine contact a poison control center or emergency room at once. NOTE: This medicine is only for you. Do not share this medicine with others. What if I miss a dose? It is important not to miss a dose. Call your doctor or health care professional if you are unable to keep an appointment. What may interact with this medicine? -medicines for seizures -medicines to increase blood counts like filgrastim, pegfilgrastim, sargramostim -some antibiotics like amikacin, gentamicin, neomycin, streptomycin, tobramycin -vaccines Talk to your doctor or health care professional before taking any of these medicines: -acetaminophen -aspirin -ibuprofen -ketoprofen -naproxen This list may not describe all possible interactions. Give your health care provider a list of all the medicines, herbs,  non-prescription drugs, or dietary supplements you use. Also tell them if you smoke, drink alcohol, or use illegal drugs. Some items may interact with your medicine. What should I watch for while using this medicine? Your condition will be monitored carefully while you are receiving this medicine. You will need important blood work done while you are taking this medicine. This drug may make you feel generally unwell. This is not uncommon, as chemotherapy can affect healthy cells as well as cancer cells. Report any side effects. Continue your course of treatment even though you feel ill unless your doctor tells you to stop. In some cases, you may be given additional medicines to help with side effects. Follow all directions for their use. Call your doctor or health care professional for advice if you get a fever, chills or sore throat, or other symptoms of a cold or flu. Do not treat yourself. This drug decreases your body's ability to fight infections. Try to avoid being around people who are sick. This medicine may increase your risk to bruise or bleed. Call your doctor or health care professional if you notice any unusual bleeding. Be careful brushing and flossing your teeth or using a toothpick because you may get an infection or bleed more easily. If you have any dental work done, tell your dentist you are receiving this medicine. Avoid taking products that contain aspirin, acetaminophen, ibuprofen, naproxen, or ketoprofen unless instructed by your doctor. These medicines may hide a fever. Do not become pregnant while taking this medicine. Women should inform their doctor if they wish to become pregnant or think they might be pregnant. There is a potential for serious side effects to an unborn child. Talk to your health care professional or  pharmacist for more information. Do not breast-feed an infant while taking this medicine. What side effects may I notice from receiving this medicine? Side effects  that you should report to your doctor or health care professional as soon as possible: -allergic reactions like skin rash, itching or hives, swelling of the face, lips, or tongue -signs of infection - fever or chills, cough, sore throat, pain or difficulty passing urine -signs of decreased platelets or bleeding - bruising, pinpoint red spots on the skin, black, tarry stools, nosebleeds -signs of decreased red blood cells - unusually weak or tired, fainting spells, lightheadedness -breathing problems -changes in hearing -changes in vision -chest pain -high blood pressure -low blood counts - This drug may decrease the number of white blood cells, red blood cells and platelets. You may be at increased risk for infections and bleeding. -nausea and vomiting -pain, swelling, redness or irritation at the injection site -pain, tingling, numbness in the hands or feet -problems with balance, talking, walking -trouble passing urine or change in the amount of urine Side effects that usually do not require medical attention (report to your doctor or health care professional if they continue or are bothersome): -hair loss -loss of appetite -metallic taste in the mouth or changes in taste This list may not describe all possible side effects. Call your doctor for medical advice about side effects. You may report side effects to FDA at 1-800-FDA-1088. Where should I keep my medicine? This drug is given in a hospital or clinic and will not be stored at home. NOTE: This sheet is a summary. It may not cover all possible information. If you have questions about this medicine, talk to your doctor, pharmacist, or health care provider.  2018 [employer] Standard (2007-05-11 14:38:05)  Paclitaxel injection What is this medicine? PACLITAXEL (PAK li TAX el) is a chemotherapy drug. It targets fast dividing cells, like cancer cells, and causes these cells to die. This medicine is used to treat ovarian cancer,  breast cancer, and other cancers. This medicine may be used for other purposes; ask your health care provider or pharmacist if you have questions. COMMON BRAND NAME(S): Onxol, Taxol What should I tell my health care provider before I take this medicine? They need to know if you have any of these conditions: -blood disorders -irregular heartbeat -infection (especially a virus infection such as chickenpox, cold sores, or herpes) -liver disease -previous or ongoing radiation therapy -an unusual or allergic reaction to paclitaxel, alcohol, polyoxyethylated castor oil, other chemotherapy agents, other medicines, foods, dyes, or preservatives -pregnant or trying to get pregnant -breast-feeding How should I use this medicine? This drug is given as an infusion into a vein. It is administered in a hospital or clinic by a specially trained health care professional. Talk to your pediatrician regarding the use of this medicine in children. Special care may be needed. Overdosage: If you think you have taken too much of this medicine contact a poison control center or emergency room at once. NOTE: This medicine is only for you. Do not share this medicine with others. What if I miss a dose? It is important not to miss your dose. Call your doctor or health care professional if you are unable to keep an appointment. What may interact with this medicine? Do not take this medicine with any of the following medications: -disulfiram -metronidazole This medicine may also interact with the following medications: -cyclosporine -diazepam -ketoconazole -medicines to increase blood counts like filgrastim, pegfilgrastim, sargramostim -other chemotherapy drugs like  cisplatin, doxorubicin, epirubicin, etoposide, teniposide, vincristine -quinidine -testosterone -vaccines -verapamil Talk to your doctor or health care professional before taking any of these  medicines: -acetaminophen -aspirin -ibuprofen -ketoprofen -naproxen This list may not describe all possible interactions. Give your health care provider a list of all the medicines, herbs, non-prescription drugs, or dietary supplements you use. Also tell them if you smoke, drink alcohol, or use illegal drugs. Some items may interact with your medicine. What should I watch for while using this medicine? Your condition will be monitored carefully while you are receiving this medicine. You will need important blood work done while you are taking this medicine. This medicine can cause serious allergic reactions. To reduce your risk you will need to take other medicine(s) before treatment with this medicine. If you experience allergic reactions like skin rash, itching or hives, swelling of the face, lips, or tongue, tell your doctor or health care professional right away. In some cases, you may be given additional medicines to help with side effects. Follow all directions for their use. This drug may make you feel generally unwell. This is not uncommon, as chemotherapy can affect healthy cells as well as cancer cells. Report any side effects. Continue your course of treatment even though you feel ill unless your doctor tells you to stop. Call your doctor or health care professional for advice if you get a fever, chills or sore throat, or other symptoms of a cold or flu. Do not treat yourself. This drug decreases your body's ability to fight infections. Try to avoid being around people who are sick. This medicine may increase your risk to bruise or bleed. Call your doctor or health care professional if you notice any unusual bleeding. Be careful brushing and flossing your teeth or using a toothpick because you may get an infection or bleed more easily. If you have any dental work done, tell your dentist you are receiving this medicine. Avoid taking products that contain aspirin, acetaminophen, ibuprofen,  naproxen, or ketoprofen unless instructed by your doctor. These medicines may hide a fever. Do not become pregnant while taking this medicine. Women should inform their doctor if they wish to become pregnant or think they might be pregnant. There is a potential for serious side effects to an unborn child. Talk to your health care professional or pharmacist for more information. Do not breast-feed an infant while taking this medicine. Men are advised not to father a child while receiving this medicine. This product may contain alcohol. Ask your pharmacist or healthcare provider if this medicine contains alcohol. Be sure to tell all healthcare providers you are taking this medicine. Certain medicines, like metronidazole and disulfiram, can cause an unpleasant reaction when taken with alcohol. The reaction includes flushing, headache, nausea, vomiting, sweating, and increased thirst. The reaction can last from 30 minutes to several hours. What side effects may I notice from receiving this medicine? Side effects that you should report to your doctor or health care professional as soon as possible: -allergic reactions like skin rash, itching or hives, swelling of the face, lips, or tongue -low blood counts - This drug may decrease the number of white blood cells, red blood cells and platelets. You may be at increased risk for infections and bleeding. -signs of infection - fever or chills, cough, sore throat, pain or difficulty passing urine -signs of decreased platelets or bleeding - bruising, pinpoint red spots on the skin, black, tarry stools, nosebleeds -signs of decreased red blood cells - unusually weak  or tired, fainting spells, lightheadedness -breathing problems -chest pain -high or low blood pressure -mouth sores -nausea and vomiting -pain, swelling, redness or irritation at the injection site -pain, tingling, numbness in the hands or feet -slow or irregular heartbeat -swelling of the ankle,  feet, hands Side effects that usually do not require medical attention (report to your doctor or health care professional if they continue or are bothersome): -bone pain -complete hair loss including hair on your head, underarms, pubic hair, eyebrows, and eyelashes -changes in the color of fingernails -diarrhea -loosening of the fingernails -loss of appetite -muscle or joint pain -red flush to skin -sweating This list may not describe all possible side effects. Call your doctor for medical advice about side effects. You may report side effects to FDA at 1-800-FDA-1088. Where should I keep my medicine? This drug is given in a hospital or clinic and will not be stored at home. NOTE: This sheet is a summary. It may not cover all possible information. If you have questions about this medicine, talk to your doctor, pharmacist, or health care provider.  2018 [employer] (2014-12-05 19:58:00)

## 2017-05-22 NOTE — Progress Notes (Signed)
[facility] OFFICE PROGRESS NOTE  [name], MD [address] [place] [address]  DIAGNOSIS: stage IIIB (T3, N2, M0) non-small cell lung cancer, adenocarcinoma diagnosed in March 2019 and presented with large right upper lobe lung mass in addition to right hilar and mediastinal lymphadenopathy.  PD-L1 0%  PRIOR THERAPY: None  CURRENT THERAPY: Concurrent chemoradiation with weekly carboplatin for an AUC of 2 and paclitaxel 45 mg meter squared.  First dose expected on 06/01/2017.  INTERVAL HISTORY: Ashley Wilson 62 y.o. female returns for routine follow-up visit accompanied by her son and daughter.  The patient is feeling fine today and has no specific complaints except for left arm swelling.  This is been ongoing for several months.  She has been using Tylenol for discomfort to her arm which is helpful.  She is seen in otology in the past who did not have any additional suggestions on how to help with this left arm swelling and pain.  The patient has chest pain, shortness of breath, cough, hemoptysis.  Denies nausea, vomiting, constipation, diarrhea.  The patient had a recent MRI of the brain and a PET scan is here to discuss the results.  MEDICAL HISTORY: Past Medical History:  Diagnosis Date  . Compression fracture of lumbar vertebra ([place])   . Hypertension     ALLERGIES:  has No Known Allergies.  MEDICATIONS:  Current Outpatient Medications  Medication Sig Dispense Refill  . acetaminophen (TYLENOL) 500 MG tablet Take 1,000 mg by mouth every 6 (six) hours as needed for mild pain or moderate pain.    Marland Kitchen amLODipine (NORVASC) 10 MG tablet Take 10 mg by mouth daily.    . cetirizine (ZYRTEC) 10 MG tablet Take 10 mg by mouth daily as needed for allergies.    Marland Kitchen prochlorperazine (COMPAZINE) 10 MG tablet Take 1 tablet (10 mg total) by mouth every 6 (six) hours as needed for nausea or vomiting. 30 tablet 0  . spironolactone (ALDACTONE) 25 MG tablet Take 25 mg by mouth  daily.     No current facility-administered medications for this visit.     SURGICAL HISTORY: History reviewed. No pertinent surgical history.  REVIEW OF SYSTEMS:   Review of Systems  Constitutional: Negative for appetite change, chills, fatigue, fever and unexpected weight change.  HENT:   Negative for mouth sores, nosebleeds, sore throat and trouble swallowing.   Eyes: Negative for eye problems and icterus.  Respiratory: Negative for cough, hemoptysis, shortness of breath and wheezing.   Cardiovascular: Negative for chest pain and leg swelling. Positive for left arm swelling. Gastrointestinal: Negative for abdominal pain, constipation, diarrhea, nausea and vomiting.  Genitourinary: Negative for bladder incontinence, difficulty urinating, dysuria, frequency and hematuria.   Musculoskeletal: Negative for back pain, gait problem, neck pain and neck stiffness.  Skin: Negative for itching and rash.  Neurological: Negative for dizziness, extremity weakness, gait problem, headaches, light-headedness and seizures.  Hematological: Negative for adenopathy. Does not bruise/bleed easily.  Psychiatric/Behavioral: Negative for confusion, depression and sleep disturbance. The patient is not nervous/anxious.     PHYSICAL EXAMINATION:  Blood pressure (!) 146/64, pulse 79, temperature 98 F (36.7 C), temperature source Oral, resp. rate 18, height 5' 3"  (1.6 m), weight 163 lb 11.2 oz (74.3 kg), SpO2 99 %.  ECOG PERFORMANCE STATUS: 1 - Symptomatic but completely ambulatory  Physical Exam  Constitutional: Oriented to person, place, and time and well-developed, well-nourished, and in no distress. No distress.  HENT:  Head: Normocephalic and  atraumatic.  Mouth/Throat: Oropharynx is clear and moist. No oropharyngeal exudate.  Eyes: Conjunctivae are normal. Right eye exhibits no discharge. Left eye exhibits no discharge. No scleral icterus.  Neck: Normal range of motion. Neck supple.  Cardiovascular:  Normal rate, regular rhythm, normal heart sounds and intact distal pulses.  1+ edema to the left arm.  Pulmonary/Chest: Effort normal and breath sounds normal. No respiratory distress. No wheezes. No rales.  Abdominal: Soft. Bowel sounds are normal. Exhibits no distension and no mass. There is no tenderness.  Musculoskeletal: Normal range of motion. Exhibits no edema.  Lymphadenopathy:    No cervical adenopathy.  Neurological: Alert and oriented to person, place, and time. Exhibits normal muscle tone. Gait normal. Coordination normal.  Skin: Skin is warm and dry. No rash noted. Not diaphoretic. No erythema. No pallor.  Psychiatric: Mood, memory and judgment normal.  Vitals reviewed.  LABORATORY DATA: Lab Results  Component Value Date   WBC 9.3 05/08/2017   HGB 11.6 (L) 04/28/2017   HCT 35.4 05/08/2017   MCV 81.8 05/08/2017   PLT 571 (H) 05/08/2017      Chemistry      Component Value Date/Time   NA 142 05/08/2017 0851   K 3.5 05/08/2017 0851   CL 107 05/08/2017 0851   CO2 23 05/08/2017 0851   BUN 8 05/08/2017 0851   CREATININE 0.84 05/08/2017 0851      Component Value Date/Time   CALCIUM 9.4 05/08/2017 0851   ALKPHOS 176 (H) 05/08/2017 0851   AST 7 05/08/2017 0851   ALT <6 05/08/2017 0851   BILITOT 0.2 05/08/2017 0851       RADIOGRAPHIC STUDIES:  Mr Jeri Cos JE Contrast  Result Date: 05/21/2017 CLINICAL DATA:  Lung cancer staging EXAM: MRI HEAD WITHOUT AND WITH CONTRAST TECHNIQUE: Multiplanar, multiecho pulse sequences of the brain and surrounding structures were obtained without and with intravenous contrast. CONTRAST:  26m MULTIHANCE GADOBENATE DIMEGLUMINE 529 MG/ML IV SOLN COMPARISON:  None. FINDINGS: Brain: Ventricle size and cerebral volume normal. Negative for acute infarct. Scattered small white matter hyperintensities bilaterally most likely due to chronic microvascular ischemia. Negative for hemorrhage, mass, or edema. Normal enhancement postcontrast infusion. No  enhancing metastatic deposits. Vascular: Normal arterial flow voids Skull and upper cervical spine: Negative Sinuses/Orbits: Negative Other: None IMPRESSION: No acute abnormality and negative for metastatic disease. Mild chronic microvascular ischemic change in the white matter. Electronically Signed   By: CFranchot GalloM.D.   On: 05/21/2017 11:10   Nm Pet Image Initial (pi) Skull Base To Thigh  Result Date: 05/21/2017 CLINICAL DATA:  Initial treatment strategy for non-small cell lung cancer. EXAM: NUCLEAR MEDICINE PET SKULL BASE TO THIGH TECHNIQUE: 8.0 mCi F-18 FDG was injected intravenously. Full-ring PET imaging was performed from the skull base to thigh after the radiotracer. CT data was obtained and used for attenuation correction and anatomic localization. Fasting blood glucose: 126 mg/dl COMPARISON:  04/21/2017 FINDINGS: Mediastinal blood pool activity: SUV max 2.2 NECK: Low-grade palatine tonsillar activity with maximum SUV 4.9 on the right and 2.8 on the left, but no corresponding CT abnormality. Incidental CT findings: Small calcified left thyroid nodule without hypermetabolic activity. CHEST: 9.4 by 8.2 by 9.1 cm (volume = 370 cm^3) solid right upper lobe mass, maximum SUV 19.3, this abuts a significant portion of the pleura and lung apex but is without observed chest wall invasion. Right paratracheal lymph node 1.3 cm in short axis on image 48/4 (formerly the same), maximum SUV 2.9. A lower right  paratracheal lymph node measuring 1.2 cm in short axis on image 53/4 has a maximum SUV of 2.8. Incidental CT findings: Coronary, aortic arch, and branch vessel atherosclerotic vascular disease. Mild cardiomegaly. Peripheral bulla anteriorly along the right upper lobe on image 27/8, stable. Mild atelectasis in the lingula and dependently in both lower lobes. Centrilobular emphysema. ABDOMEN/PELVIS: Septated cystic lesion with faint calcifications along the anterior margin left kidney appears generally  photopenic, and measures 7.3 by 5.3 by 11.3 cm in size. A right upper retrocrural lymph node measuring 1.2 cm in short axis has a maximum SUV of 2.0. Incidental CT findings: Aortoiliac atherosclerotic vascular disease. Anterior uterine fibroids are suspected. Sigmoid colon diverticulosis. SKELETON: No significant abnormal hypermetabolic skeletal activity. 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 IMPRESSION: 1. 9.4 cm solid right upper lobe mass with maximum SUV 19.3 compatible with malignancy. This has a significant area of abutment with the pleura but no definite chest wall or rib invasion. Right paratracheal lymph nodes are enlarged and have a maximum SUV mildly above blood pool, probably reflecting early malignant spread. There is an enlarged right retrocrural lymph node which is not currently hypermetabolic. 2. No other findings of distant metastatic spread. 3. Asymmetry of low-grade palatine tonsillar activity is probably incidental/physiologic. 4. There is a large septated cystic lesion of the left kidney with some associated calcifications. Although this lesion appears photopenic on PET-CT, it is clearly complex and warrants definitive characterization. Renal protocol MRI with and without contrast is recommended. 5. Other imaging findings of potential clinical significance: Aortic Atherosclerosis (ICD10-I70.0). Coronary atherosclerosis with mild cardiomegaly. Emphysema (ICD10-J43.9). Anterior uterine fibroids. Sigmoid colon diverticulosis. 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 Electronically Signed   By: Van Clines M.D.   On: 05/21/2017 10:51   Ct Biopsy  Result Date: 04/28/2017 INDICATION: 62 year old female with a history of right upper lobe lung mass. EXAM: CT BIOPSY MEDICATIONS: None. ANESTHESIA/SEDATION: Moderate (conscious) sedation was employed during this procedure. A total of Versed 1.0 mg and Fentanyl 100 mcg was administered intravenously. Moderate Sedation Time: 10  minutes. The patient's level of consciousness and vital signs were monitored continuously by radiology nursing throughout the procedure under my direct supervision. FLUOROSCOPY TIME:  CT COMPLICATIONS: None PROCEDURE: The procedure, risks, benefits, and alternatives were explained to the patient and the patient's family. Specific risks that were addressed included bleeding, infection, pneumothorax, need for further procedure including chest tube placement, chance of delayed pneumothorax or hemorrhage, hemoptysis, nondiagnostic sample, cardiopulmonary collapse, death. Questions regarding the procedure were encouraged and answered. The patient understands and consents to the procedure. Patient was positioned in the prone position on the CT gantry table and a scout CT of the chest was performed for planning purposes. Once angle of approach was determined, the skin and subcutaneous tissues this scan was prepped and draped in the usual sterile fashion, and a sterile drape was applied covering the operative field. A sterile gown and sterile gloves were used for the procedure. Local anesthesia was provided with 1% Lidocaine. The skin and subcutaneous tissues were infiltrated 1% lidocaine for local anesthesia, and a small stab incision was made with an 11 blade scalpel. Using CT guidance, a 17 gauge trocar needle was advanced into the right upper lobetarget. After confirmation of the tip, separate 18 gauge core biopsies were performed. These were placed into solution for transportation to the lab. Biosentry Device was deployed. A final CT image was performed. Patient tolerated the procedure well and remained hemodynamically stable throughout. No complications were  encountered and no significant blood loss was encounter IMPRESSION: Status post CT-guided biopsy of right upper lobe mass. Tissue specimen sent to pathology for complete histopathologic analysis. Signed, Dulcy Fanny. Earleen Newport, DO Vascular and Interventional Radiology  [facility] Electronically Signed   By: Corrie Mckusick D.O.   On: 04/28/2017 12:37   Dg Chest Port 1 View  Result Date: 04/28/2017 CLINICAL DATA:  62 year old female with a history of recent lung biopsy EXAM: PORTABLE CHEST 1 VIEW COMPARISON:  CT 04/28/2017, chest x-ray 01/12/2006 FINDINGS: Cardiomediastinal silhouette unchanged. No evidence of central vascular congestion. No pneumothorax. No pleural effusion. No new confluent airspace disease. Right upper lobe lung mass, better characterized on recent CT chest. IMPRESSION: No pneumothorax status post right lung mass biopsy. Electronically Signed   By: Corrie Mckusick D.O.   On: 04/28/2017 15:56     ASSESSMENT/PLAN:  Malignant neoplasm of unspecified part of unspecified bronchus or lung [facility]) This is a very pleasant 62 year old [name] recently diagnosed with a stage IIIB (T3, N2, M0) non-small cell lung cancer, adenocarcinoma diagnosed in March 2019 and presented with large right upper lobe lung mass in addition to right hilar and mediastinal lymphadenopathy. PDL 1 is 0% and molecular studies are pending. She had a recent MRI of the brain and PET scan and is here to discuss the results.  The patient was seen with Dr. Julien Nordmann.  MRI results were discussed with the patient and her family which showed no evidence of metastatic disease.  PET scan results and images were reviewed with the patient and her family which showed a 9.4 cm right upper lobe mass with hypermetabolism.  There is no disease outside of the chest.  We had a lengthy discussion with the patient and her children today about her current disease stage, prognosis and treatment options.  Recommend a course of concurrent chemoradiation with weekly carboplatin for AUC of 2 and paclitaxel 45 mg/M2 with radiation.  She will have a repeat CT scan after her treatment is complete and we may consider a surgical referral versus treatment with chemotherapy or  immunotherapy. Discussed with the patient adverse effects of this treatment including but not limited to alopecia, myelosuppression, nausea and vomiting, peripheral neuropathy, liver or renal dysfunction.  The patient is in agreement to proceeding. She will have a chemotherapy education class. A prescription for for Compazine 10 mg by mouth every 6 hours as needed for nausea was sent to her pharmacy.  A referral to radiation oncology has been made consideration of radiation to her right lung mass.  Anticipate that treatment will begin on 06/01/2017.  She will have a follow-up visit on 06/08/2017 for evaluation prior to cycle 2 of her chemotherapy.  The etiology of the left arm swelling is unclear.  Advised the patient to use ibuprofen to help with inflammation and pain.  We discussed with the patient and her family that if this is related to her cancer, it should improve with treatment.  The patient was advised to call immediately if she has any concerning symptoms in the interval.   Orders Placed This Encounter  Procedures  . CBC with Differential (Cancer Center Only)    Standing Status:   Standing    Number of Occurrences:   20    Standing Expiration Date:   05/23/2018  . CMP ([facility] only)    Standing Status:   Standing    Number of Occurrences:   20    Standing Expiration Date:  05/23/2018  . Ambulatory referral to Radiation Oncology    Referral Priority:   Routine    Referral Type:   Consultation    Referral Reason:   Specialty Services Required    Requested Specialty:   Radiation Oncology    Number of Visits Requested:   1   Mikey Bussing, DNP, AGPCNP-BC, AOCNP 05/22/17   ADDENDUM: Hematology/Oncology Attending: I had a face-to-face encounter with the patient.  I recommended her care plan.  This is a very pleasant 62 years old [name] recently diagnosed with a stage IIIB (T4, N2, M0) non-small cell lung cancer, adenocarcinoma diagnosed in March 2019 and  presented with large right upper lobe lung mass in addition to right hilar and mediastinal lymphadenopathy. Her molecular studies are still pending. I personally and independently reviewed the PET scan as well as MRI results with the patient and her family and showed them the images. The PET scan showed no evidence of metastatic disease outside the chest. I recommended for the patient a course of concurrent chemoradiation with weekly carboplatin for AUC of 2 and paclitaxel 45 mg/M2, followed by reevaluation for surgical resection or consolidation immunotherapy if she has no evidence for disease progression. I discussed with the patient the adverse effect of this treatment including but not limited to alopecia, myelosuppression, nausea and vomiting, peripheral neuropathy, liver or renal dysfunction. The patient and her family had several questions and I answered them completely to their satisfaction. I will refer the patient to radiation oncology for evaluation and discussion of the radiotherapy option. She is expected to start the first dose of this treatment on June 01, 2017. We will arrange for the patient a chemotherapy education class before the first dose of her treatment. She was advised to call immediately if she has any concerning symptoms in the interval.  Disclaimer: This note was dictated with voice recognition software. Similar sounding words can inadvertently be transcribed and may be missed upon review. Ashley Kempf, MD 05/23/17

## 2017-05-22 NOTE — Assessment & Plan Note (Addendum)
This is a very pleasant 62 year old [name] recently diagnosed with a stage IIIB (T3, N2, M0) non-small cell lung cancer, adenocarcinoma diagnosed in March 2019 and presented with large right upper lobe lung mass in addition to right hilar and mediastinal lymphadenopathy. PDL 1 is 0% and molecular studies are pending. She had a recent MRI of the brain and PET scan and is here to discuss the results.  The patient was seen with Dr. Julien Nordmann.  MRI results were discussed with the patient and her family which showed no evidence of metastatic disease.  PET scan results and images were reviewed with the patient and her family which showed a 9.4 cm right upper lobe mass with hypermetabolism.  There is no disease outside of the chest.  We had a lengthy discussion with the patient and her children today about her current disease stage, prognosis and treatment options.  Recommend a course of concurrent chemoradiation with weekly carboplatin for AUC of 2 and paclitaxel 45 mg/M2 with radiation.  She will have a repeat CT scan after her treatment is complete and we may consider a surgical referral versus treatment with chemotherapy or immunotherapy. Discussed with the patient adverse effects of this treatment including but not limited to alopecia, myelosuppression, nausea and vomiting, peripheral neuropathy, liver or renal dysfunction.  The patient is in agreement to proceeding. She will have a chemotherapy education class. A prescription for for Compazine 10 mg by mouth every 6 hours as needed for nausea was sent to her pharmacy.  A referral to radiation oncology has been made consideration of radiation to her right lung mass.  Anticipate that treatment will begin on 06/01/2017.  She will have a follow-up visit on 06/08/2017 for evaluation prior to cycle 2 of her chemotherapy.  The etiology of the left arm swelling is unclear.  Advised the patient to use ibuprofen to help with inflammation and pain.   We discussed with the patient and her family that if this is related to her cancer, it should improve with treatment.  The patient was advised to call immediately if she has any concerning symptoms in the interval.

## 2017-05-22 NOTE — Progress Notes (Signed)
START ON PATHWAY REGIMEN - Non-Small Cell Lung     Administer weekly:     Paclitaxel      Carboplatin   **Always confirm dose/schedule in your pharmacy ordering system**    Patient Characteristics: Stage III - Unresectable, PS = 0, 1 AJCC T Category: T4 Current Disease Status: No Distant Mets or Local Recurrence AJCC N Category: N2 AJCC M Category: M0 AJCC 8 Stage Grouping: IIIB Performance Status: PS = 0, 1 Intent of Therapy: Curative Intent, Discussed with Patient

## 2017-05-22 NOTE — Telephone Encounter (Signed)
Appointments scheduled AVS/Calendar printed per 4/5 los

## 2017-05-25 ENCOUNTER — Encounter: Payer: Self-pay | Admitting: Radiation Oncology

## 2017-05-25 NOTE — Progress Notes (Signed)
Thoracic Location of Tumor / Histology: Non small cell lung cancer, adenocarcinoma diagnosed in March 2019, Right Upper lobe  Malignant neoplasm of unspecified bronchus or lung  Patient presented with swelling in her ankles and leg since June 2018, referred to rheumatologist by her PCP.  Had a CT scan done at the rheumatologists office that was concerning for lung mass.  MRI Brain 05/21/2017: No acute abnormality and negative for metastatic disease.  PET 05/21/2017:  1. 9.4 cm solid right upper lobe mass with maximum SUV 19.3 compatible with malignancy. This has a significant area of abutment with the pleura but no definite chest wall or rib invasion. Right paratracheal lymph nodes are enlarged and have a maximum SUV mildly above blood pool, probably reflecting early malignant spread. There is an enlarged right retrocrural lymph node which is not currently hypermetabolic. 2. No other findings of distant metastatic spread.   4. There is a large septated cystic lesion of the left kidney with some associated calcifications  CT chest: large right upper lobe mass in the posterior right apex measuring 8.9 x 8.0 cm concerning for primary lung cancer.  There was also borderline and mildly enlarged mediastinal lymph nodes.  The largest lymph node is a right paratracheal lymph node measuring 1.3 cm in short axis diameter.  There was also a border line sized AP window, and prevascular lymph nodes.  The scan also showed 7.6 cm cystic lesion of the upper pole of the left kidney likely benign minimally complex cyst.  Biopsies of Right Lung 04/28/2017    Tobacco/Marijuana/Snuff/ETOH use: Alcohol use, former smoker quit 04/28/2017.  Past/Anticipated interventions by cardiothoracic surgery, if any:   Past/Anticipated interventions by medical oncology, if any:  NP Curcio 05/22/2017 Recommend a course of concurrent chemoradiation with weekly carboplatin for AUC of 2 and paclitaxel 45 mg/M2 with radiation.  She will  have a repeat CT scan after her treatment is complete and we may consider a surgical referral versus treatment with chemotherapy or immunotherapy. A referral to radiation oncology has been made consideration of radiation to her right lung mass.  Anticipate that treatment will begin on 06/01/2017.  She will have a follow-up visit on 06/08/2017 for evaluation prior to cycle 2 of her chemotherapy.  Dr. Mckinley Jewel 05/08/2017 I recommended for the patient to complete the staging workup for her disease by ordering a PET scan as well as MRI of the brain to rule out any metastatic lesions. If the patient has no evidence for metastatic disease, she would be considered for a course of concurrent chemoradiation with weekly carboplatin for AUC of 2 and paclitaxel 45 mg/M2 with radiation followed by consolidation immunotherapy if she has no evidence for disease progression. I will arrange for the patient to come back for follow-up visit in around 10 days for further discussion of her treatment options based on the final staging workup.   Signs/Symptoms  Weight changes, if any: No  Respiratory complaints, if any: No  Hemoptysis, if any: No, coughs up phlegm in the morning only.  Pain issues, if any:  8/10 pain in left hand, soreness in her chest every now and then since her biopsy  BP 130/67   Pulse 66   Temp 98.1 F (36.7 C) (Oral)   Resp 18   Ht 5\' 3"  (1.6 m)   Wt 163 lb 3.2 oz (74 kg)   SpO2 100%   BMI 28.91 kg/m    Wt Readings from Last 3 Encounters:  05/26/17 163 lb 3.2 oz (  74 kg)  05/22/17 163 lb 11.2 oz (74.3 kg)  05/08/17 162 lb 14.4 oz (73.9 kg)    SAFETY ISSUES:  Prior radiation? No  Pacemaker/ICD? No  Possible current pregnancy? No  Is the patient on methotrexate? No  Current Complaints / other details:  Left had is swollen, has been since feb 2019.

## 2017-05-26 ENCOUNTER — Encounter (HOSPITAL_COMMUNITY): Payer: Self-pay | Admitting: Internal Medicine

## 2017-05-26 ENCOUNTER — Encounter: Payer: Self-pay | Admitting: Radiation Oncology

## 2017-05-26 ENCOUNTER — Ambulatory Visit
Admission: RE | Admit: 2017-05-26 | Discharge: 2017-05-26 | Disposition: A | Discharge status: Home / Self Care | Source: Ambulatory Visit | Attending: Radiation Oncology | Admitting: Radiation Oncology

## 2017-05-26 ENCOUNTER — Inpatient Hospital Stay

## 2017-05-26 ENCOUNTER — Other Ambulatory Visit: Payer: Self-pay

## 2017-05-26 VITALS — BP 130/67 | HR 66 | Temp 98.1°F | Resp 18 | Ht 63.0 in | Wt 163.2 lb

## 2017-05-26 DIAGNOSIS — C3490 Malignant neoplasm of unspecified part of unspecified bronchus or lung: Secondary | ICD-10-CM | POA: Insufficient documentation

## 2017-05-26 DIAGNOSIS — Z87891 Personal history of nicotine dependence: Secondary | ICD-10-CM | POA: Diagnosis not present

## 2017-05-26 DIAGNOSIS — C3410 Malignant neoplasm of upper lobe, unspecified bronchus or lung: Secondary | ICD-10-CM

## 2017-05-26 DIAGNOSIS — Z79899 Other long term (current) drug therapy: Secondary | ICD-10-CM | POA: Diagnosis not present

## 2017-05-26 DIAGNOSIS — I10 Essential (primary) hypertension: Secondary | ICD-10-CM | POA: Diagnosis not present

## 2017-05-26 DIAGNOSIS — R590 Localized enlarged lymph nodes: Secondary | ICD-10-CM | POA: Insufficient documentation

## 2017-05-26 DIAGNOSIS — Z8249 Family history of ischemic heart disease and other diseases of the circulatory system: Secondary | ICD-10-CM | POA: Diagnosis not present

## 2017-05-26 NOTE — Progress Notes (Signed)
Radiation Oncology         [phone] ________________________________  Name: Ashley Wilson        MRN: [id number]  Date of Service: 05/26/2017 DOB: 09-Oct-1955  [id number], Myra Rude, MD  Curt Bears, MD     REFERRING PHYSICIAN: Curt Bears, MD   DIAGNOSIS: The encounter diagnosis was Malignant neoplasm of unspecified part of unspecified bronchus or lung ([place]).   HISTORY OF PRESENT ILLNESS: Ashley Wilson is a 62 y.o. female with a new diagnosis of right lung cancer. The patient originally presented with swelling in her legs and ankles since June 2018. She was evaluated by her PCP and referred to a rheumatologist due to swelling and an aching pain in the hands. During examination, clubbing of nails was noted and the patient was scheduled for a chest x-ray. She had an ultrasound of both upper extremities per patient and this was negative for DVT. Chest x-ray showed a possible lung mass. Subsequent CT scan on 04/21/17 showed a large right upper lobe mass in the posterior right apex measuring 8.9 x 8.0 cm concerning for primary lung cancer. There was also borderline and mildly enlarged mediastinal lymph nodes. The largest lymph node was the right paratracheal lymph node and measured 1.3 cm. A 7.6 cm cystic lesion of the upper pole of the left kidney likely benign minimally complex cyst also noted. The patient underwent biopsy of the right upper lobe lung mass on 04/28/17 revealing adenocarcinoma. The patient presented to Dr. Julien Nordmann on 05/08/17. Per his note, he recommends concurrent chemoradiation followed by consolidation immunotherapy if she has no evidence for disease progression. The patient underwent PET scan on 05/21/17. This showed a 9.4 cm solid right upper lobe mass with a significant area of abutment with the pleura but no definite chest wall or rib invasion. Right paratracheal lymph nodes are enlarged with a maximum SUV mildly above blood pool reflecting early malignant spread. No other  findings of distant metastatic spread noted. The patient then underwent MRI of the brain on 05/21/17. This showed no acute abnormality and negative for metastatic disease. The patient has been referred today for evaluation of radiotherapy directed to the right lung mass.    PREVIOUS RADIATION THERAPY: No   PAST MEDICAL HISTORY:  Past Medical History:  Diagnosis Date  . Compression fracture of lumbar vertebra ([place])   . Hypertension        PAST SURGICAL HISTORY: Past Surgical History:  Procedure Laterality Date  . BACK SURGERY  2009   ruptured disc     FAMILY HISTORY:  Family History  Problem Relation Age of Onset  . Hypertension Mother   . CAD Mother   . Heart attack Mother   . Diabetes Father   . CAD Father   . Bone cancer Brother      SOCIAL HISTORY:  reports that she quit smoking about 4 weeks ago. Her smoking use included cigarettes. She started smoking about 41 years ago. She quit after 35.00 years of use. She has never used smokeless tobacco. She reports that she drinks about 1.2 - 1.8 oz of alcohol per week. She reports that she does not use drugs.   ALLERGIES: Patient has no known allergies.   MEDICATIONS:  Current Outpatient Medications  Medication Sig Dispense Refill  . acetaminophen (TYLENOL) 500 MG tablet Take 1,000 mg by mouth every 6 (six) hours as needed for mild pain or moderate pain.    Marland Kitchen amLODipine (NORVASC) 10 MG tablet Take 10 mg by  mouth daily.    . cetirizine (ZYRTEC) 10 MG tablet Take 10 mg by mouth daily as needed for allergies.    Marland Kitchen prochlorperazine (COMPAZINE) 10 MG tablet Take 1 tablet (10 mg total) by mouth every 6 (six) hours as needed for nausea or vomiting. 30 tablet 0  . spironolactone (ALDACTONE) 25 MG tablet Take 25 mg by mouth daily.     No current facility-administered medications for this encounter.      REVIEW OF SYSTEMS: On review of systems, the patient reports that she is doing well overall. She notes persistent edema of  the left hand to the wrist. She denies any chest pain, shortness of breath, cough, fevers, chills, night sweats. She's noticed 3 pounds of unintentional weight loss.  denies any bowel or bladder disturbances, and denies abdominal pain, nausea or vomiting. She denies any new musculoskeletal or joint aches or pains. A complete review of systems is obtained and is otherwise negative.     PHYSICAL EXAM:  Wt Readings from Last 3 Encounters:  05/26/17 163 lb 3.2 oz (74 kg)  05/22/17 163 lb 11.2 oz (74.3 kg)  05/08/17 162 lb 14.4 oz (73.9 kg)   Temp Readings from Last 3 Encounters:  05/26/17 98.1 F (36.7 C) (Oral)  05/22/17 98 F (36.7 C) (Oral)  05/08/17 99.1 F (37.3 C) (Oral)   BP Readings from Last 3 Encounters:  05/26/17 130/67  05/22/17 (!) 146/64  05/08/17 (!) 132/58   Pulse Readings from Last 3 Encounters:  05/26/17 66  05/22/17 79  05/08/17 70   Pain Assessment Pain Score: 2 (Left hand swollen)/10  In general this is a well appearing African American female in no acute distress. She is alert and oriented x4 and appropriate throughout the examination. HEENT reveals that the patient is normocephalic, atraumatic. EOMs are intact. PERRLA. Skin is intact without any evidence of gross lesions. Cardiovascular exam reveals a regular rate and rhythm, no clicks rubs or murmurs are auscultated. Chest is clear to auscultation bilaterally. Lymphatic assessment is performed and does not reveal any adenopathy in the cervical, supraclavicular, axillary, or inguinal chains. Abdomen has active bowel sounds in all quadrants and is intact. The abdomen is soft, non tender, non distended. Lower extremities are negative for pretibial pitting edema, deep calf tenderness, cyanosis or clubbing. The left hand reveals changes consistent with stigmata of rheumatoid arthritis, and this is also noted of the right hand. No edema is noted of the arms cephalad, and no collateral vessels are noted of the chest wall.      ECOG = 1  0 - Asymptomatic (Fully active, able to carry on all predisease activities without restriction)  1 - Symptomatic but completely ambulatory (Restricted in physically strenuous activity but ambulatory and able to carry out work of a light or sedentary nature. For example, light housework, office work)  2 - Symptomatic, <50% in bed during the day (Ambulatory and capable of all self care but unable to carry out any work activities. Up and about more than 50% of waking hours)  3 - Symptomatic, >50% in bed, but not bedbound (Capable of only limited self-care, confined to bed or chair 50% or more of waking hours)  4 - Bedbound (Completely disabled. Cannot carry on any self-care. Totally confined to bed or chair)  5 - Death   Eustace Pen MM, Creech RH, Tormey DC, et al. [phone]). "Toxicity and response criteria of the [facility]". [place] Oncol. 5 (6): [phone]  LABORATORY DATA:  Lab Results  Component Value Date   WBC 9.3 05/08/2017   HGB 11.6 (L) 04/28/2017   HCT 35.4 05/08/2017   MCV 81.8 05/08/2017   PLT 571 (H) 05/08/2017   Lab Results  Component Value Date   NA 142 05/08/2017   K 3.5 05/08/2017   CL 107 05/08/2017   CO2 23 05/08/2017   Lab Results  Component Value Date   ALT <6 05/08/2017   AST 7 05/08/2017   ALKPHOS 176 (H) 05/08/2017   BILITOT 0.2 05/08/2017      RADIOGRAPHY: Mr Jeri Cos VO Contrast  Result Date: 05/21/2017 CLINICAL DATA:  Lung cancer staging EXAM: MRI HEAD WITHOUT AND WITH CONTRAST TECHNIQUE: Multiplanar, multiecho pulse sequences of the brain and surrounding structures were obtained without and with intravenous contrast. CONTRAST:  48mL MULTIHANCE GADOBENATE DIMEGLUMINE 529 MG/ML IV SOLN COMPARISON:  None. FINDINGS: Brain: Ventricle size and cerebral volume normal. Negative for acute infarct. Scattered small white matter hyperintensities bilaterally most likely due to chronic microvascular ischemia. Negative for  hemorrhage, mass, or edema. Normal enhancement postcontrast infusion. No enhancing metastatic deposits. Vascular: Normal arterial flow voids Skull and upper cervical spine: Negative Sinuses/Orbits: Negative Other: None IMPRESSION: No acute abnormality and negative for metastatic disease. Mild chronic microvascular ischemic change in the white matter. Electronically Signed   By: Franchot Gallo M.D.   On: 05/21/2017 11:10   Nm Pet Image Initial (pi) Skull Base To Thigh  Result Date: 05/21/2017 CLINICAL DATA:  Initial treatment strategy for non-small cell lung cancer. EXAM: NUCLEAR MEDICINE PET SKULL BASE TO THIGH TECHNIQUE: 8.0 mCi F-18 FDG was injected intravenously. Full-ring PET imaging was performed from the skull base to thigh after the radiotracer. CT data was obtained and used for attenuation correction and anatomic localization. Fasting blood glucose: 126 mg/dl COMPARISON:  04/21/2017 FINDINGS: Mediastinal blood pool activity: SUV max 2.2 NECK: Low-grade palatine tonsillar activity with maximum SUV 4.9 on the right and 2.8 on the left, but no corresponding CT abnormality. Incidental CT findings: Small calcified left thyroid nodule without hypermetabolic activity. CHEST: 9.4 by 8.2 by 9.1 cm (volume = 370 cm^3) solid right upper lobe mass, maximum SUV 19.3, this abuts a significant portion of the pleura and lung apex but is without observed chest wall invasion. Right paratracheal lymph node 1.3 cm in short axis on image 48/4 (formerly the same), maximum SUV 2.9. A lower right paratracheal lymph node measuring 1.2 cm in short axis on image 53/4 has a maximum SUV of 2.8. Incidental CT findings: Coronary, aortic arch, and branch vessel atherosclerotic vascular disease. Mild cardiomegaly. Peripheral bulla anteriorly along the right upper lobe on image 27/8, stable. Mild atelectasis in the lingula and dependently in both lower lobes. Centrilobular emphysema. ABDOMEN/PELVIS: Septated cystic lesion with faint  calcifications along the anterior margin left kidney appears generally photopenic, and measures 7.3 by 5.3 by 11.3 cm in size. A right upper retrocrural lymph node measuring 1.2 cm in short axis has a maximum SUV of 2.0. Incidental CT findings: Aortoiliac atherosclerotic vascular disease. Anterior uterine fibroids are suspected. Sigmoid colon diverticulosis. SKELETON: No significant abnormal hypermetabolic skeletal activity. 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 IMPRESSION: 1. 9.4 cm solid right upper lobe mass with maximum SUV 19.3 compatible with malignancy. This has a significant area of abutment with the pleura but no definite chest wall or rib invasion. Right paratracheal lymph nodes are enlarged and have a maximum SUV mildly above blood pool, probably reflecting early malignant  spread. There is an enlarged right retrocrural lymph node which is not currently hypermetabolic. 2. No other findings of distant metastatic spread. 3. Asymmetry of low-grade palatine tonsillar activity is probably incidental/physiologic. 4. There is a large septated cystic lesion of the left kidney with some associated calcifications. Although this lesion appears photopenic on PET-CT, it is clearly complex and warrants definitive characterization. Renal protocol MRI with and without contrast is recommended. 5. Other imaging findings of potential clinical significance: Aortic Atherosclerosis (ICD10-I70.0). Coronary atherosclerosis with mild cardiomegaly. Emphysema (ICD10-J43.9). Anterior uterine fibroids. Sigmoid colon diverticulosis. 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 Electronically Signed   By: Van Clines M.D.   On: 05/21/2017 10:51   Ct Biopsy  Result Date: 04/28/2017 INDICATION: 62 year old female with a history of right upper lobe lung mass. EXAM: CT BIOPSY MEDICATIONS: None. ANESTHESIA/SEDATION: Moderate (conscious) sedation was employed during this procedure. A total of Versed 1.0 mg and Fentanyl  100 mcg was administered intravenously. Moderate Sedation Time: 10 minutes. The patient's level of consciousness and vital signs were monitored continuously by radiology nursing throughout the procedure under my direct supervision. FLUOROSCOPY TIME:  CT COMPLICATIONS: None PROCEDURE: The procedure, risks, benefits, and alternatives were explained to the patient and the patient's family. Specific risks that were addressed included bleeding, infection, pneumothorax, need for further procedure including chest tube placement, chance of delayed pneumothorax or hemorrhage, hemoptysis, nondiagnostic sample, cardiopulmonary collapse, death. Questions regarding the procedure were encouraged and answered. The patient understands and consents to the procedure. Patient was positioned in the prone position on the CT gantry table and a scout CT of the chest was performed for planning purposes. Once angle of approach was determined, the skin and subcutaneous tissues this scan was prepped and draped in the usual sterile fashion, and a sterile drape was applied covering the operative field. A sterile gown and sterile gloves were used for the procedure. Local anesthesia was provided with 1% Lidocaine. The skin and subcutaneous tissues were infiltrated 1% lidocaine for local anesthesia, and a small stab incision was made with an 11 blade scalpel. Using CT guidance, a 17 gauge trocar needle was advanced into the right upper lobetarget. After confirmation of the tip, separate 18 gauge core biopsies were performed. These were placed into solution for transportation to the lab. Biosentry Device was deployed. A final CT image was performed. Patient tolerated the procedure well and remained hemodynamically stable throughout. No complications were encountered and no significant blood loss was encounter IMPRESSION: Status post CT-guided biopsy of right upper lobe mass. Tissue specimen sent to pathology for complete histopathologic analysis.  Signed, Dulcy Fanny. Earleen Newport, DO [facility] Electronically Signed   By: Corrie Mckusick D.O.   On: 04/28/2017 12:37   Dg Chest Port 1 View  Result Date: 04/28/2017 CLINICAL DATA:  62 year old female with a history of recent lung biopsy EXAM: PORTABLE CHEST 1 VIEW COMPARISON:  CT 04/28/2017, chest x-ray 01/12/2006 FINDINGS: Cardiomediastinal silhouette unchanged. No evidence of central vascular congestion. No pneumothorax. No pleural effusion. No new confluent airspace disease. Right upper lobe lung mass, better characterized on recent CT chest. IMPRESSION: No pneumothorax status post right lung mass biopsy. Electronically Signed   By: Corrie Mckusick D.O.   On: 04/28/2017 15:56       IMPRESSION/PLAN: 1. Stage IIIB, T3N2M0 NSCLC, adenocarcinoma of the RUL. Dr. Lisbeth Renshaw discusses the pathology findings and reviews the nature of invasive lung disease and discusses options of concurrent treatment with chemotherapy. We discussed the risks, benefits,  short, and long term effects of radiotherapy, and the patient is interested in proceeding. Dr. Lisbeth Renshaw discusses the delivery and logistics of radiotherapy and anticipates a course of 6 1/2 weeks. We will plan to simulate tomorrow or Thursday, and anticipate starting therapy on 06/02/17. Written consent is obtained and placed in the chart, a copy was provided to the patient.   The above documentation reflects my direct findings during this shared patient visit. Please see the separate note by Dr. Lisbeth Renshaw on this date for the remainder of the patient's plan of care.    Carola Rhine, PAC

## 2017-05-28 ENCOUNTER — Ambulatory Visit
Admission: RE | Admit: 2017-05-28 | Discharge: 2017-05-28 | Disposition: A | Discharge status: Home / Self Care | Source: Ambulatory Visit | Attending: Radiation Oncology | Admitting: Radiation Oncology

## 2017-05-28 DIAGNOSIS — Z510 Encounter for antineoplastic radiation therapy: Secondary | ICD-10-CM | POA: Diagnosis present

## 2017-05-28 DIAGNOSIS — C3411 Malignant neoplasm of upper lobe, right bronchus or lung: Secondary | ICD-10-CM | POA: Insufficient documentation

## 2017-05-29 DIAGNOSIS — Z510 Encounter for antineoplastic radiation therapy: Secondary | ICD-10-CM | POA: Diagnosis not present

## 2017-06-02 ENCOUNTER — Inpatient Hospital Stay

## 2017-06-02 ENCOUNTER — Inpatient Hospital Stay: Admitting: Medical

## 2017-06-02 ENCOUNTER — Telehealth: Payer: Self-pay

## 2017-06-02 ENCOUNTER — Other Ambulatory Visit: Payer: Self-pay | Admitting: Medical Oncology

## 2017-06-02 ENCOUNTER — Ambulatory Visit
Admission: RE | Admit: 2017-06-02 | Discharge: 2017-06-02 | Disposition: A | Discharge status: Home / Self Care | Source: Ambulatory Visit | Attending: Radiation Oncology | Admitting: Radiation Oncology

## 2017-06-02 VITALS — BP 135/75 | HR 88 | Temp 98.7°F | Resp 18

## 2017-06-02 DIAGNOSIS — Z5111 Encounter for antineoplastic chemotherapy: Secondary | ICD-10-CM

## 2017-06-02 DIAGNOSIS — T8090XA Unspecified complication following infusion and therapeutic injection, initial encounter: Secondary | ICD-10-CM

## 2017-06-02 DIAGNOSIS — Z510 Encounter for antineoplastic radiation therapy: Secondary | ICD-10-CM | POA: Diagnosis not present

## 2017-06-02 DIAGNOSIS — C3490 Malignant neoplasm of unspecified part of unspecified bronchus or lung: Secondary | ICD-10-CM

## 2017-06-02 LAB — CBC WITH DIFFERENTIAL (CANCER CENTER ONLY)
Basophils Absolute: 0.1 10*3/uL (ref 0.0–0.1)
Basophils Relative: 1 %
EOS ABS: 0.1 10*3/uL (ref 0.0–0.5)
EOS PCT: 1 %
HCT: 38.2 % (ref 34.8–46.6)
Hemoglobin: 12.3 g/dL (ref 11.6–15.9)
LYMPHS ABS: 3.2 10*3/uL (ref 0.9–3.3)
LYMPHS PCT: 30 %
MCH: 27 pg (ref 25.1–34.0)
MCHC: 32.2 g/dL (ref 31.5–36.0)
MCV: 83.8 fL (ref 79.5–101.0)
MONOS PCT: 8 %
Monocytes Absolute: 0.8 10*3/uL (ref 0.1–0.9)
Neutro Abs: 6.4 10*3/uL (ref 1.5–6.5)
Neutrophils Relative %: 60 %
PLATELETS: 473 10*3/uL — AB (ref 145–400)
RBC: 4.56 MIL/uL (ref 3.70–5.45)
RDW: 15.2 % — ABNORMAL HIGH (ref 11.2–14.5)
WBC Count: 10.5 10*3/uL — ABNORMAL HIGH (ref 3.9–10.3)

## 2017-06-02 LAB — CMP (CANCER CENTER ONLY)
ALBUMIN: 3.1 g/dL — AB (ref 3.5–5.0)
ALT: 6 U/L (ref 0–55)
AST: 9 U/L (ref 5–34)
Alkaline Phosphatase: 147 U/L (ref 40–150)
Anion gap: 11 (ref 3–11)
BUN: 10 mg/dL (ref 7–26)
CO2: 21 mmol/L — AB (ref 22–29)
CREATININE: 0.84 mg/dL (ref 0.60–1.10)
Calcium: 9.9 mg/dL (ref 8.4–10.4)
Chloride: 109 mmol/L (ref 98–109)
GFR, Est AFR Am: >60 mL/min (ref >60)
GFR, Estimated: >60 mL/min (ref >60)
GLUCOSE: 126 mg/dL (ref 70–140)
Potassium: 3.9 mmol/L (ref 3.5–5.1)
SODIUM: 141 mmol/L (ref 136–145)
Total Bilirubin: 0.4 mg/dL (ref 0.2–1.2)
Total Protein: 8.2 g/dL (ref 6.4–8.3)

## 2017-06-02 MED ORDER — PROCHLORPERAZINE MALEATE 10 MG PO TABS: 10.0000 mg | ORAL_TABLET | Freq: Four times a day (QID) | ORAL | 0 refills | Status: DC | PRN

## 2017-06-02 MED ORDER — SODIUM CHLORIDE 0.9 % IV SOLN
45.0000 mg/m2 | Freq: Once | INTRAVENOUS | Status: AC
  Administered 2017-06-02: 84 mg via INTRAVENOUS
  Filled 2017-06-02: qty 14

## 2017-06-02 MED ORDER — METHYLPREDNISOLONE SODIUM SUCC 125 MG IJ SOLR
125.0000 mg | Freq: Once | INTRAMUSCULAR | Status: AC | PRN
  Administered 2017-06-02: 125 mg via INTRAVENOUS

## 2017-06-02 MED ORDER — SODIUM CHLORIDE 0.9 % IV SOLN
Freq: Once | INTRAVENOUS | Status: AC
  Administered 2017-06-02: 09:00:00 via INTRAVENOUS

## 2017-06-02 MED ORDER — PALONOSETRON HCL INJECTION 0.25 MG/5ML
INTRAVENOUS | Status: AC
  Filled 2017-06-02: qty 5

## 2017-06-02 MED ORDER — MORPHINE SULFATE (PF) 4 MG/ML IV SOLN
INTRAVENOUS | Status: AC
  Filled 2017-06-02: qty 1

## 2017-06-02 MED ORDER — MORPHINE SULFATE 4 MG/ML IJ SOLN
2.0000 mg | INTRAMUSCULAR | Status: AC
  Administered 2017-06-02: 2 mg via INTRAVENOUS
  Filled 2017-06-02: qty 1

## 2017-06-02 MED ORDER — DIPHENHYDRAMINE HCL 50 MG/ML IJ SOLN
INTRAMUSCULAR | Status: AC
  Filled 2017-06-02: qty 1

## 2017-06-02 MED ORDER — DEXAMETHASONE SODIUM PHOSPHATE 100 MG/10ML IJ SOLN
20.0000 mg | Freq: Once | INTRAMUSCULAR | Status: AC
  Administered 2017-06-02: 20 mg via INTRAVENOUS
  Filled 2017-06-02: qty 2

## 2017-06-02 MED ORDER — FAMOTIDINE IN NACL 20-0.9 MG/50ML-% IV SOLN
INTRAVENOUS | Status: AC
  Filled 2017-06-02: qty 50

## 2017-06-02 MED ORDER — SODIUM CHLORIDE 0.9 % IV SOLN
210.8000 mg | Freq: Once | INTRAVENOUS | Status: AC
  Administered 2017-06-02: 210 mg via INTRAVENOUS
  Filled 2017-06-02: qty 21

## 2017-06-02 MED ORDER — DIPHENHYDRAMINE HCL 50 MG/ML IJ SOLN
50.0000 mg | Freq: Once | INTRAMUSCULAR | Status: AC
  Administered 2017-06-02: 50 mg via INTRAVENOUS

## 2017-06-02 MED ORDER — FAMOTIDINE IN NACL 20-0.9 MG/50ML-% IV SOLN
20.0000 mg | Freq: Once | INTRAVENOUS | Status: AC
  Administered 2017-06-02: 20 mg via INTRAVENOUS

## 2017-06-02 MED ORDER — PALONOSETRON HCL INJECTION 0.25 MG/5ML
0.2500 mg | Freq: Once | INTRAVENOUS | Status: AC
  Administered 2017-06-02: 0.25 mg via INTRAVENOUS

## 2017-06-02 MED ORDER — SODIUM CHLORIDE 0.9 % IV SOLN
Freq: Once | INTRAVENOUS | Status: AC | PRN
  Administered 2017-06-02: 11:00:00 via INTRAVENOUS

## 2017-06-02 MED ORDER — FAMOTIDINE IN NACL 20-0.9 MG/50ML-% IV SOLN
20.0000 mg | Freq: Once | INTRAVENOUS | Status: AC | PRN
  Administered 2017-06-02: 20 mg via INTRAVENOUS

## 2017-06-02 MED FILL — PROCHLORPERAZINE 5 MG TAB: 5 | 8 days supply | Qty: 60 | Fill #0

## 2017-06-02 NOTE — Patient Instructions (Signed)
[facility] Discharge Instructions for Patients Receiving Chemotherapy  Today you received the following chemotherapy agents Taxol, Carbo  To help prevent nausea and vomiting after your treatment, we encourage you to take your nausea medication as prescribed by MD   If you develop nausea and vomiting that is not controlled by your nausea medication, call the clinic.   BELOW ARE SYMPTOMS THAT SHOULD BE REPORTED IMMEDIATELY:  *FEVER GREATER THAN 100.5 F  *CHILLS WITH OR WITHOUT FEVER  NAUSEA AND VOMITING THAT IS NOT CONTROLLED WITH YOUR NAUSEA MEDICATION  *UNUSUAL SHORTNESS OF BREATH  *UNUSUAL BRUISING OR BLEEDING  TENDERNESS IN MOUTH AND THROAT WITH OR WITHOUT PRESENCE OF ULCERS  *URINARY PROBLEMS  *BOWEL PROBLEMS  UNUSUAL RASH Items with * indicate a potential emergency and should be followed up as soon as possible.  Feel free to call the clinic should you have any questions or concerns. The clinic phone number is [phone].  Please show the [facility] at check-in to the Emergency Department and triage nurse.  (Taxol) Paclitaxel injection What is this medicine? PACLITAXEL (PAK li TAX el) is a chemotherapy drug. It targets fast dividing cells, like cancer cells, and causes these cells to die. This medicine is used to treat ovarian cancer, breast cancer, and other cancers. This medicine may be used for other purposes; ask your health care provider or pharmacist if you have questions. COMMON BRAND NAME(S): Onxol, Taxol What should I tell my health care provider before I take this medicine? They need to know if you have any of these conditions: -blood disorders -irregular heartbeat -infection (especially a virus infection such as chickenpox, cold sores, or herpes) -liver disease -previous or ongoing radiation therapy -an unusual or allergic reaction to paclitaxel, alcohol, polyoxyethylated castor oil, other chemotherapy agents, other medicines,  foods, dyes, or preservatives -pregnant or trying to get pregnant -breast-feeding How should I use this medicine? This drug is given as an infusion into a vein. It is administered in a hospital or clinic by a specially trained health care professional. Talk to your pediatrician regarding the use of this medicine in children. Special care may be needed. Overdosage: If you think you have taken too much of this medicine contact a poison control center or emergency room at once. NOTE: This medicine is only for you. Do not share this medicine with others. What if I miss a dose? It is important not to miss your dose. Call your doctor or health care professional if you are unable to keep an appointment. What may interact with this medicine? Do not take this medicine with any of the following medications: -disulfiram -metronidazole This medicine may also interact with the following medications: -cyclosporine -diazepam -ketoconazole -medicines to increase blood counts like filgrastim, pegfilgrastim, sargramostim -other chemotherapy drugs like cisplatin, doxorubicin, epirubicin, etoposide, teniposide, vincristine -quinidine -testosterone -vaccines -verapamil Talk to your doctor or health care professional before taking any of these medicines: -acetaminophen -aspirin -ibuprofen -ketoprofen -naproxen This list may not describe all possible interactions. Give your health care provider a list of all the medicines, herbs, non-prescription drugs, or dietary supplements you use. Also tell them if you smoke, drink alcohol, or use illegal drugs. Some items may interact with your medicine. What should I watch for while using this medicine? Your condition will be monitored carefully while you are receiving this medicine. You will need important blood work done while you are taking this medicine. This medicine can cause serious allergic reactions. To reduce your risk you  will need to take other  medicine(s) before treatment with this medicine. If you experience allergic reactions like skin rash, itching or hives, swelling of the face, lips, or tongue, tell your doctor or health care professional right away. In some cases, you may be given additional medicines to help with side effects. Follow all directions for their use. This drug may make you feel generally unwell. This is not uncommon, as chemotherapy can affect healthy cells as well as cancer cells. Report any side effects. Continue your course of treatment even though you feel ill unless your doctor tells you to stop. Call your doctor or health care professional for advice if you get a fever, chills or sore throat, or other symptoms of a cold or flu. Do not treat yourself. This drug decreases your body's ability to fight infections. Try to avoid being around people who are sick. This medicine may increase your risk to bruise or bleed. Call your doctor or health care professional if you notice any unusual bleeding. Be careful brushing and flossing your teeth or using a toothpick because you may get an infection or bleed more easily. If you have any dental work done, tell your dentist you are receiving this medicine. Avoid taking products that contain aspirin, acetaminophen, ibuprofen, naproxen, or ketoprofen unless instructed by your doctor. These medicines may hide a fever. Do not become pregnant while taking this medicine. Women should inform their doctor if they wish to become pregnant or think they might be pregnant. There is a potential for serious side effects to an unborn child. Talk to your health care professional or pharmacist for more information. Do not breast-feed an infant while taking this medicine. Men are advised not to father a child while receiving this medicine. This product may contain alcohol. Ask your pharmacist or healthcare provider if this medicine contains alcohol. Be sure to tell all healthcare providers you are  taking this medicine. Certain medicines, like metronidazole and disulfiram, can cause an unpleasant reaction when taken with alcohol. The reaction includes flushing, headache, nausea, vomiting, sweating, and increased thirst. The reaction can last from 30 minutes to several hours. What side effects may I notice from receiving this medicine? Side effects that you should report to your doctor or health care professional as soon as possible: -allergic reactions like skin rash, itching or hives, swelling of the face, lips, or tongue -low blood counts - This drug may decrease the number of white blood cells, red blood cells and platelets. You may be at increased risk for infections and bleeding. -signs of infection - fever or chills, cough, sore throat, pain or difficulty passing urine -signs of decreased platelets or bleeding - bruising, pinpoint red spots on the skin, black, tarry stools, nosebleeds -signs of decreased red blood cells - unusually weak or tired, fainting spells, lightheadedness -breathing problems -chest pain -high or low blood pressure -mouth sores -nausea and vomiting -pain, swelling, redness or irritation at the injection site -pain, tingling, numbness in the hands or feet -slow or irregular heartbeat -swelling of the ankle, feet, hands Side effects that usually do not require medical attention (report to your doctor or health care professional if they continue or are bothersome): -bone pain -complete hair loss including hair on your head, underarms, pubic hair, eyebrows, and eyelashes -changes in the color of fingernails -diarrhea -loosening of the fingernails -loss of appetite -muscle or joint pain -red flush to skin -sweating This list may not describe all possible side effects. Call your doctor for  medical advice about side effects. You may report side effects to FDA at 1-800-FDA-1088. Where should I keep my medicine? This drug is given in a hospital or clinic and  will not be stored at home. NOTE: This sheet is a summary. It may not cover all possible information. If you have questions about this medicine, talk to your doctor, pharmacist, or health care provider.  2018 Elsevier/Gold Standard (2014-12-05 19:58:00)  (Carbo) Carboplatin injection What is this medicine? CARBOPLATIN (KAR boe pla tin) is a chemotherapy drug. It targets fast dividing cells, like cancer cells, and causes these cells to die. This medicine is used to treat ovarian cancer and many other cancers. This medicine may be used for other purposes; ask your health care provider or pharmacist if you have questions. COMMON BRAND NAME(S): Paraplatin What should I tell my health care provider before I take this medicine? They need to know if you have any of these conditions: -blood disorders -hearing problems -kidney disease -recent or ongoing radiation therapy -an unusual or allergic reaction to carboplatin, cisplatin, other chemotherapy, other medicines, foods, dyes, or preservatives -pregnant or trying to get pregnant -breast-feeding How should I use this medicine? This drug is usually given as an infusion into a vein. It is administered in a hospital or clinic by a specially trained health care professional. Talk to your pediatrician regarding the use of this medicine in children. Special care may be needed. Overdosage: If you think you have taken too much of this medicine contact a poison control center or emergency room at once. NOTE: This medicine is only for you. Do not share this medicine with others. What if I miss a dose? It is important not to miss a dose. Call your doctor or health care professional if you are unable to keep an appointment. What may interact with this medicine? -medicines for seizures -medicines to increase blood counts like filgrastim, pegfilgrastim, sargramostim -some antibiotics like amikacin, gentamicin, neomycin, streptomycin,  tobramycin -vaccines Talk to your doctor or health care professional before taking any of these medicines: -acetaminophen -aspirin -ibuprofen -ketoprofen -naproxen This list may not describe all possible interactions. Give your health care provider a list of all the medicines, herbs, non-prescription drugs, or dietary supplements you use. Also tell them if you smoke, drink alcohol, or use illegal drugs. Some items may interact with your medicine. What should I watch for while using this medicine? Your condition will be monitored carefully while you are receiving this medicine. You will need important blood work done while you are taking this medicine. This drug may make you feel generally unwell. This is not uncommon, as chemotherapy can affect healthy cells as well as cancer cells. Report any side effects. Continue your course of treatment even though you feel ill unless your doctor tells you to stop. In some cases, you may be given additional medicines to help with side effects. Follow all directions for their use. Call your doctor or health care professional for advice if you get a fever, chills or sore throat, or other symptoms of a cold or flu. Do not treat yourself. This drug decreases your body's ability to fight infections. Try to avoid being around people who are sick. This medicine may increase your risk to bruise or bleed. Call your doctor or health care professional if you notice any unusual bleeding. Be careful brushing and flossing your teeth or using a toothpick because you may get an infection or bleed more easily. If you have any dental work done,  tell your dentist you are receiving this medicine. Avoid taking products that contain aspirin, acetaminophen, ibuprofen, naproxen, or ketoprofen unless instructed by your doctor. These medicines may hide a fever. Do not become pregnant while taking this medicine. Women should inform their doctor if they wish to become pregnant or think  they might be pregnant. There is a potential for serious side effects to an unborn child. Talk to your health care professional or pharmacist for more information. Do not breast-feed an infant while taking this medicine. What side effects may I notice from receiving this medicine? Side effects that you should report to your doctor or health care professional as soon as possible: -allergic reactions like skin rash, itching or hives, swelling of the face, lips, or tongue -signs of infection - fever or chills, cough, sore throat, pain or difficulty passing urine -signs of decreased platelets or bleeding - bruising, pinpoint red spots on the skin, black, tarry stools, nosebleeds -signs of decreased red blood cells - unusually weak or tired, fainting spells, lightheadedness -breathing problems -changes in hearing -changes in vision -chest pain -high blood pressure -low blood counts - This drug may decrease the number of white blood cells, red blood cells and platelets. You may be at increased risk for infections and bleeding. -nausea and vomiting -pain, swelling, redness or irritation at the injection site -pain, tingling, numbness in the hands or feet -problems with balance, talking, walking -trouble passing urine or change in the amount of urine Side effects that usually do not require medical attention (report to your doctor or health care professional if they continue or are bothersome): -hair loss -loss of appetite -metallic taste in the mouth or changes in taste This list may not describe all possible side effects. Call your doctor for medical advice about side effects. You may report side effects to FDA at 1-800-FDA-1088. Where should I keep my medicine? This drug is given in a hospital or clinic and will not be stored at home. NOTE: This sheet is a summary. It may not cover all possible information. If you have questions about this medicine, talk to your doctor, pharmacist, or health care  provider.  2018 Elsevier/Gold Standard (2007-05-11 14:38:05)

## 2017-06-02 NOTE — Progress Notes (Signed)
1108 patient began having difficulty breathing and became very flushed. Infusion was stopped immediatly and normal saline hung wide open. Sandi Mealy, PA called to chairside. See mar for medication administration, vitals.  1145 Taxol re challenged.   Patient tolerated infusion without any further complications.

## 2017-06-02 NOTE — Addendum Note (Signed)
Addended by: Ardeen Garland on: 06/02/2017 03:47 PM   Modules accepted: Orders

## 2017-06-03 ENCOUNTER — Telehealth: Payer: Self-pay | Admitting: Medical Oncology

## 2017-06-03 ENCOUNTER — Ambulatory Visit
Admission: RE | Admit: 2017-06-03 | Discharge: 2017-06-03 | Disposition: A | Discharge status: Home / Self Care | Source: Ambulatory Visit | Attending: Radiation Oncology | Admitting: Radiation Oncology

## 2017-06-03 DIAGNOSIS — Z510 Encounter for antineoplastic radiation therapy: Secondary | ICD-10-CM | POA: Diagnosis not present

## 2017-06-03 NOTE — Telephone Encounter (Signed)
'  I am doing great today- I am eating , I feel good. Woke up this am and usually stiff and achy, but  I  moved better , like I used to."

## 2017-06-04 ENCOUNTER — Ambulatory Visit
Admission: RE | Admit: 2017-06-04 | Discharge: 2017-06-04 | Disposition: A | Discharge status: Home / Self Care | Source: Ambulatory Visit | Attending: Radiation Oncology | Admitting: Radiation Oncology

## 2017-06-04 DIAGNOSIS — Z510 Encounter for antineoplastic radiation therapy: Secondary | ICD-10-CM | POA: Diagnosis not present

## 2017-06-04 NOTE — Progress Notes (Signed)
   DATE:     06/02/2017    _X_ CHEMO/IMMUNOTHERAPY REACTION         MD:  _X_  Mohamed    AGENT/BLOOD PRODUCT TO RECEIVE TODAY:  Carboplatin and paclitaxel  AGENT/BLOOD PRODUCT RECEIVED PRIOR TO REACTION: Paclitaxel  VS: BP: 111/69 p: 72 SPO2: 96% T: 98.5  REACTION(S): _X_   Shortness of breath  PREMEDS:  _X_ Aloxi    _X_  Benadryl (_50_ mg, _X_ IV ___ PO) _X_ Dexamethasone (_20_ mg, _X_ IV ___ PO)    _X_ Pepcid (20 mg)     INTERVENTION:  _X_  Pepcid (20 mg)  x  _1_     Time: ______     Time: ______ _X_  Solumedrol (_125_ mg)  x  _1_   Time: ______     Time: ______  Review of Systems  Constitutional: Negative for diaphoresis.  HENT: Negative for trouble swallowing.   Respiratory: Positive for shortness of breath. Negative for cough, choking, chest tightness and wheezing.   Cardiovascular: Negative for chest pain and palpitations.  Gastrointestinal: Negative for nausea and vomiting.  Musculoskeletal: Negative for back pain.  Skin: Negative for color change and rash.  Neurological: Negative for dizziness, light-headedness and headaches.   Physical Exam  Constitutional: No distress.  HENT:  Head: Normocephalic and atraumatic.  Eyes: Right eye exhibits no discharge. Left eye exhibits no discharge. No scleral icterus.  Cardiovascular: Normal rate, regular rhythm and normal heart sounds. Exam reveals no gallop and no friction rub.  No murmur heard. Pulmonary/Chest: Effort normal and breath sounds normal. No respiratory distress. She has no wheezes. She has no rales.  Neurological: She is alert.  Skin: Skin is warm and dry. She is not diaphoretic.  Psychiatric: Mood, memory, affect and judgment normal.     ___ EKG:   ___  RAPID RESPONSE CALLED ___  CODE BLUE CALLED  OUTCOME: _X_  Patient responded to intervention  _X_ Chemo/Immunotherapy restarted and completed  ___ Chemotherapy ____________________ terminated  ___ Patient transported to ER for management ___  Other: ____________________________________________________________________________  ________________________________________________ Sandi Mealy, MHS, PA-C

## 2017-06-05 ENCOUNTER — Encounter: Payer: Self-pay | Admitting: *Deleted

## 2017-06-05 ENCOUNTER — Ambulatory Visit
Admission: RE | Admit: 2017-06-05 | Discharge: 2017-06-05 | Disposition: A | Discharge status: Home / Self Care | Source: Ambulatory Visit | Attending: Radiation Oncology | Admitting: Radiation Oncology

## 2017-06-05 DIAGNOSIS — C3490 Malignant neoplasm of unspecified part of unspecified bronchus or lung: Secondary | ICD-10-CM

## 2017-06-05 DIAGNOSIS — Z510 Encounter for antineoplastic radiation therapy: Secondary | ICD-10-CM | POA: Diagnosis not present

## 2017-06-05 MED ORDER — SONAFINE EX EMUL
1.0000 "application " | Freq: Two times a day (BID) | CUTANEOUS | Status: DC
  Administered 2017-06-05: 1 via TOPICAL

## 2017-06-05 NOTE — Progress Notes (Signed)
Pt here for patient teaching.  Pt given Radiation and You booklet and Sonafine.  Reviewed areas of pertinence such as fatigue, hair loss, skin changes, throat changes, cough and shortness of breath . Pt able to give teach back of to pat skin,apply Sonafine bid. Pt verbalizes understanding of information given and will contact nursing with any questions or concerns.    Cori Razor, RN

## 2017-06-05 NOTE — Progress Notes (Signed)
[facility] Psychosocial Distress Screening Clinical Social Work  Clinical Social Work was referred by distress screening protocol.  The patient scored a 5 on the Psychosocial Distress Thermometer which indicates moderate distress. Clinical Social Worker attempted to contact patient to assess for distress and other psychosocial needs. CSW unable to leave voicemail for patient.  CSW left voicemail on patient's daughter phone requesting a return call.  [facility] DISTRESS SCREENING 05/26/2017  Screening Type Initial Screening  Distress experienced in past week (1-10) 5  Emotional problem type Nervousness/Anxiety;Adjusting to illness  Physical Problem type Tingling hands/feet;Swollen arms/legs    Lauren Alver Sorrow, MSW, LCSW, OSW-C Clinical Social Worker [facility] [phone]

## 2017-06-08 ENCOUNTER — Telehealth: Payer: Self-pay | Admitting: Internal Medicine

## 2017-06-08 ENCOUNTER — Encounter: Payer: Self-pay | Admitting: Internal Medicine

## 2017-06-08 ENCOUNTER — Ambulatory Visit
Admission: RE | Admit: 2017-06-08 | Discharge: 2017-06-08 | Disposition: A | Discharge status: Home / Self Care | Source: Ambulatory Visit | Attending: Radiation Oncology | Admitting: Radiation Oncology

## 2017-06-08 ENCOUNTER — Inpatient Hospital Stay

## 2017-06-08 ENCOUNTER — Inpatient Hospital Stay (HOSPITAL_BASED_OUTPATIENT_CLINIC_OR_DEPARTMENT_OTHER): Admitting: Internal Medicine

## 2017-06-08 VITALS — BP 122/57 | HR 88 | Temp 98.4°F | Resp 17 | Ht 63.0 in | Wt 155.7 lb

## 2017-06-08 DIAGNOSIS — R42 Dizziness and giddiness: Secondary | ICD-10-CM | POA: Diagnosis not present

## 2017-06-08 DIAGNOSIS — Z923 Personal history of irradiation: Secondary | ICD-10-CM

## 2017-06-08 DIAGNOSIS — Z79899 Other long term (current) drug therapy: Secondary | ICD-10-CM

## 2017-06-08 DIAGNOSIS — F329 Major depressive disorder, single episode, unspecified: Secondary | ICD-10-CM

## 2017-06-08 DIAGNOSIS — Z510 Encounter for antineoplastic radiation therapy: Secondary | ICD-10-CM | POA: Diagnosis not present

## 2017-06-08 DIAGNOSIS — R5383 Other fatigue: Secondary | ICD-10-CM

## 2017-06-08 DIAGNOSIS — J9819 Other pulmonary collapse: Secondary | ICD-10-CM | POA: Diagnosis not present

## 2017-06-08 DIAGNOSIS — C3490 Malignant neoplasm of unspecified part of unspecified bronchus or lung: Secondary | ICD-10-CM

## 2017-06-08 DIAGNOSIS — Z5111 Encounter for antineoplastic chemotherapy: Secondary | ICD-10-CM

## 2017-06-08 DIAGNOSIS — J069 Acute upper respiratory infection, unspecified: Secondary | ICD-10-CM | POA: Diagnosis not present

## 2017-06-08 DIAGNOSIS — Z7982 Long term (current) use of aspirin: Secondary | ICD-10-CM

## 2017-06-08 DIAGNOSIS — Z792 Long term (current) use of antibiotics: Secondary | ICD-10-CM

## 2017-06-08 DIAGNOSIS — C3401 Malignant neoplasm of right main bronchus: Secondary | ICD-10-CM | POA: Diagnosis not present

## 2017-06-08 DIAGNOSIS — I10 Essential (primary) hypertension: Secondary | ICD-10-CM

## 2017-06-08 DIAGNOSIS — F17210 Nicotine dependence, cigarettes, uncomplicated: Secondary | ICD-10-CM

## 2017-06-08 LAB — CMP (CANCER CENTER ONLY)
ALT: <6 U/L (ref 0–55)
AST: 7 U/L (ref 5–34)
Albumin: 3.1 g/dL — ABNORMAL LOW (ref 3.5–5.0)
Alkaline Phosphatase: 108 U/L (ref 40–150)
Anion gap: 12 — ABNORMAL HIGH (ref 3–11)
BUN: 18 mg/dL (ref 7–26)
CHLORIDE: 103 mmol/L (ref 98–109)
CO2: 21 mmol/L — ABNORMAL LOW (ref 22–29)
Calcium: 9.8 mg/dL (ref 8.4–10.4)
Creatinine: 0.81 mg/dL (ref 0.60–1.10)
Glucose, Bld: 138 mg/dL (ref 70–140)
POTASSIUM: 3.3 mmol/L — AB (ref 3.5–5.1)
SODIUM: 136 mmol/L (ref 136–145)
Total Bilirubin: 0.3 mg/dL (ref 0.2–1.2)
Total Protein: 7.5 g/dL (ref 6.4–8.3)

## 2017-06-08 LAB — CBC WITH DIFFERENTIAL (CANCER CENTER ONLY)
BASOS ABS: 0.1 10*3/uL (ref 0.0–0.1)
BASOS PCT: 1 %
EOS PCT: 1 %
Eosinophils Absolute: 0 10*3/uL (ref 0.0–0.5)
HCT: 33.4 % — ABNORMAL LOW (ref 34.8–46.6)
Hemoglobin: 11.1 g/dL — ABNORMAL LOW (ref 11.6–15.9)
LYMPHS PCT: 15 %
Lymphs Abs: 1.4 10*3/uL (ref 0.9–3.3)
MCH: 27.2 pg (ref 25.1–34.0)
MCHC: 33.4 g/dL (ref 31.5–36.0)
MCV: 81.6 fL (ref 79.5–101.0)
Monocytes Absolute: 0.5 10*3/uL (ref 0.1–0.9)
Monocytes Relative: 6 %
NEUTROS ABS: 7.2 10*3/uL — AB (ref 1.5–6.5)
Neutrophils Relative %: 77 %
PLATELETS: 529 10*3/uL — AB (ref 145–400)
RBC: 4.09 MIL/uL (ref 3.70–5.45)
RDW: 14.5 % (ref 11.2–14.5)
WBC: 9.4 10*3/uL (ref 3.9–10.3)

## 2017-06-08 MED ORDER — HEPARIN SOD (PORK) LOCK FLUSH 100 UNIT/ML IV SOLN
500.0000 [IU] | Freq: Once | INTRAVENOUS | Status: DC | PRN
  Filled 2017-06-08: qty 5

## 2017-06-08 MED ORDER — SODIUM CHLORIDE 0.9% FLUSH
10.0000 mL | INTRAVENOUS | Status: DC | PRN
  Filled 2017-06-08: qty 10

## 2017-06-08 MED ORDER — PALONOSETRON HCL INJECTION 0.25 MG/5ML
INTRAVENOUS | Status: AC
  Filled 2017-06-08: qty 5

## 2017-06-08 MED ORDER — FAMOTIDINE IN NACL 20-0.9 MG/50ML-% IV SOLN
20.0000 mg | Freq: Once | INTRAVENOUS | Status: AC
  Administered 2017-06-08: 20 mg via INTRAVENOUS

## 2017-06-08 MED ORDER — DIPHENHYDRAMINE HCL 50 MG/ML IJ SOLN
INTRAMUSCULAR | Status: AC
  Filled 2017-06-08: qty 1

## 2017-06-08 MED ORDER — FAMOTIDINE IN NACL 20-0.9 MG/50ML-% IV SOLN
INTRAVENOUS | Status: AC
  Filled 2017-06-08: qty 50

## 2017-06-08 MED ORDER — PALONOSETRON HCL INJECTION 0.25 MG/5ML
0.2500 mg | Freq: Once | INTRAVENOUS | Status: AC
  Administered 2017-06-08: 0.25 mg via INTRAVENOUS

## 2017-06-08 MED ORDER — SODIUM CHLORIDE 0.9 % IV SOLN
20.0000 mg | Freq: Once | INTRAVENOUS | Status: AC
  Administered 2017-06-08: 20 mg via INTRAVENOUS
  Filled 2017-06-08: qty 2

## 2017-06-08 MED ORDER — SODIUM CHLORIDE 0.9 % IV SOLN
Freq: Once | INTRAVENOUS | Status: AC
  Administered 2017-06-08: 13:00:00 via INTRAVENOUS

## 2017-06-08 MED ORDER — DIPHENHYDRAMINE HCL 50 MG/ML IJ SOLN
50.0000 mg | Freq: Once | INTRAMUSCULAR | Status: AC
  Administered 2017-06-08: 50 mg via INTRAVENOUS

## 2017-06-08 MED ORDER — SODIUM CHLORIDE 0.9 % IV SOLN
45.0000 mg/m2 | Freq: Once | INTRAVENOUS | Status: AC
  Administered 2017-06-08: 84 mg via INTRAVENOUS
  Filled 2017-06-08: qty 14

## 2017-06-08 MED ORDER — SODIUM CHLORIDE 0.9 % IV SOLN
210.8000 mg | Freq: Once | INTRAVENOUS | Status: AC
  Administered 2017-06-08: 210 mg via INTRAVENOUS
  Filled 2017-06-08: qty 21

## 2017-06-08 NOTE — Telephone Encounter (Signed)
Appts already scheduled per 4/22 los -

## 2017-06-08 NOTE — Progress Notes (Signed)
[facility] Telephone:([phone]   Fax:(336) [phone]  OFFICE PROGRESS NOTE  [name], MD [address] [place] [address]  DIAGNOSIS: stageIIIB (T3, N2, M0)non-small cell lung cancer, adenocarcinoma diagnosed in March 2019 and presented with large right upper lobe lung mass in addition to right hilar and mediastinal lymphadenopathy.  PD-L1 0%  PRIOR THERAPY: None  CURRENT THERAPY: Concurrent chemoradiation with weekly carboplatin for an AUC of 2 and paclitaxel 45 mg/m2.  First dose expected on 06/01/2017.  Status post 1 cycle.   INTERVAL HISTORY: Ashley Wilson 62 y.o. female returns to the clinic today for follow-up visit accompanied by her daughter.  The patient is feeling fine today with no specific complaints except for mild fatigue.  She tolerated the first week of her treatment with concurrent chemoradiation fairly well.  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  She denied having any chest pain but continues to have shortness of breath with exertion with no cough or hemoptysis.  She has no fever or chills.  She has no nausea, vomiting but she had few episodes of diarrhea several days after the first cycle of her treatment and this was likely related to her diet at that time.  She is here today for evaluation before starting cycle #2.  MEDICAL HISTORY: Past Medical History:  Diagnosis Date  . Compression fracture of lumbar vertebra ([place])   . Hypertension     ALLERGIES:  has No Known Allergies.  MEDICATIONS:  Current Outpatient Medications  Medication Sig Dispense Refill  . acetaminophen (TYLENOL) 500 MG tablet Take 1,000 mg by mouth every 6 (six) hours as needed for mild pain or moderate pain.    Marland Kitchen amLODipine (NORVASC) 10 MG tablet Take 10 mg by mouth daily.    . cetirizine (ZYRTEC) 10 MG tablet Take 10 mg by mouth daily as needed for allergies.    Marland Kitchen prochlorperazine (COMPAZINE) 10 MG tablet Take 1 tablet  (10 mg total) by mouth every 6 (six) hours as needed for nausea or vomiting. 30 tablet 0  . spironolactone (ALDACTONE) 25 MG tablet Take 25 mg by mouth daily.     No current facility-administered medications for this visit.     SURGICAL HISTORY:  Past Surgical History:  Procedure Laterality Date  . BACK SURGERY  2009   ruptured disc    REVIEW OF SYSTEMS:  A comprehensive review of systems was negative except for: Constitutional: positive for fatigue Respiratory: positive for dyspnea on exertion Musculoskeletal: positive for arthralgias   PHYSICAL EXAMINATION: General appearance: alert, cooperative, fatigued and no distress Head: Normocephalic, without obvious abnormality, atraumatic Neck: no adenopathy, no JVD, supple, symmetrical, trachea midline and thyroid not enlarged, symmetric, no tenderness/mass/nodules Lymph nodes: Cervical, supraclavicular, and axillary nodes normal. Resp: clear to auscultation bilaterally Back: symmetric, no curvature. ROM normal. No CVA tenderness. Cardio: regular rate and rhythm, S1, S2 normal, no murmur, click, rub or gallop GI: soft, non-tender; bowel sounds normal; no masses,  no organomegaly Extremities: extremities normal, atraumatic, no cyanosis or edema  ECOG PERFORMANCE STATUS: 1 - Symptomatic but completely ambulatory  Blood pressure (!) 122/57, pulse 88, temperature 98.4 F (36.9 C), temperature source Oral, resp. rate 17, height 5' 3"  (1.6 m), weight 155 lb 11.2 oz (70.6 kg), SpO2 100 %.  LABORATORY DATA: Lab Results  Component Value Date   WBC 9.4 06/08/2017   HGB 11.1 (L) 06/08/2017   HCT 33.4 (L) 06/08/2017  MCV 81.6 06/08/2017   PLT 529 (H) 06/08/2017      Chemistry      Component Value Date/Time   NA 141 06/02/2017 0734   K 3.9 06/02/2017 0734   CL 109 06/02/2017 0734   CO2 21 (L) 06/02/2017 0734   BUN 10 06/02/2017 0734   CREATININE 0.84 06/02/2017 0734      Component Value Date/Time   CALCIUM 9.9 06/02/2017 0734    ALKPHOS 147 06/02/2017 0734   AST 9 06/02/2017 0734   ALT 6 06/02/2017 0734   BILITOT 0.4 06/02/2017 0734       RADIOGRAPHIC STUDIES: Mr Jeri Cos JO Contrast  Result Date: 05/21/2017 CLINICAL DATA:  Lung cancer staging EXAM: MRI HEAD WITHOUT AND WITH CONTRAST TECHNIQUE: Multiplanar, multiecho pulse sequences of the brain and surrounding structures were obtained without and with intravenous contrast. CONTRAST:  16m MULTIHANCE GADOBENATE DIMEGLUMINE 529 MG/ML IV SOLN COMPARISON:  None. FINDINGS: Brain: Ventricle size and cerebral volume normal. Negative for acute infarct. Scattered small white matter hyperintensities bilaterally most likely due to chronic microvascular ischemia. Negative for hemorrhage, mass, or edema. Normal enhancement postcontrast infusion. No enhancing metastatic deposits. Vascular: Normal arterial flow voids Skull and upper cervical spine: Negative Sinuses/Orbits: Negative Other: None IMPRESSION: No acute abnormality and negative for metastatic disease. Mild chronic microvascular ischemic change in the white matter. Electronically Signed   By: CFranchot GalloM.D.   On: 05/21/2017 11:10   Nm Pet Image Initial (pi) Skull Base To Thigh  Result Date: 05/21/2017 CLINICAL DATA:  Initial treatment strategy for non-small cell lung cancer. EXAM: NUCLEAR MEDICINE PET SKULL BASE TO THIGH TECHNIQUE: 8.0 mCi F-18 FDG was injected intravenously. Full-ring PET imaging was performed from the skull base to thigh after the radiotracer. CT data was obtained and used for attenuation correction and anatomic localization. Fasting blood glucose: 126 mg/dl COMPARISON:  04/21/2017 FINDINGS: Mediastinal blood pool activity: SUV max 2.2 NECK: Low-grade palatine tonsillar activity with maximum SUV 4.9 on the right and 2.8 on the left, but no corresponding CT abnormality. Incidental CT findings: Small calcified left thyroid nodule without hypermetabolic activity. CHEST: 9.4 by 8.2 by 9.1 cm (volume = 370 cm^3)  solid right upper lobe mass, maximum SUV 19.3, this abuts a significant portion of the pleura and lung apex but is without observed chest wall invasion. Right paratracheal lymph node 1.3 cm in short axis on image 48/4 (formerly the same), maximum SUV 2.9. A lower right paratracheal lymph node measuring 1.2 cm in short axis on image 53/4 has a maximum SUV of 2.8. Incidental CT findings: Coronary, aortic arch, and branch vessel atherosclerotic vascular disease. Mild cardiomegaly. Peripheral bulla anteriorly along the right upper lobe on image 27/8, stable. Mild atelectasis in the lingula and dependently in both lower lobes. Centrilobular emphysema. ABDOMEN/PELVIS: Septated cystic lesion with faint calcifications along the anterior margin left kidney appears generally photopenic, and measures 7.3 by 5.3 by 11.3 cm in size. A right upper retrocrural lymph node measuring 1.2 cm in short axis has a maximum SUV of 2.0. Incidental CT findings: Aortoiliac atherosclerotic vascular disease. Anterior uterine fibroids are suspected. Sigmoid colon diverticulosis. SKELETON: No significant abnormal hypermetabolic skeletal activity. 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 IMPRESSION: 1. 9.4 cm solid right upper lobe mass with maximum SUV 19.3 compatible with malignancy. This has a significant area of abutment with the pleura but no definite chest wall or rib invasion. Right paratracheal lymph nodes are enlarged and have a maximum SUV mildly  above blood pool, probably reflecting early malignant spread. There is an enlarged right retrocrural lymph node which is not currently hypermetabolic. 2. No other findings of distant metastatic spread. 3. Asymmetry of low-grade palatine tonsillar activity is probably incidental/physiologic. 4. There is a large septated cystic lesion of the left kidney with some associated calcifications. Although this lesion appears photopenic on PET-CT, it is clearly complex and warrants  definitive characterization. Renal protocol MRI with and without contrast is recommended. 5. Other imaging findings of potential clinical significance: Aortic Atherosclerosis (ICD10-I70.0). Coronary atherosclerosis with mild cardiomegaly. Emphysema (ICD10-J43.9). Anterior uterine fibroids. Sigmoid colon diverticulosis. 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 Electronically Signed   By: Van Clines M.D.   On: 05/21/2017 10:51    ASSESSMENT AND PLAN: This is a very pleasant 62 years old [name] with stage IIIB non-small cell lung cancer, adenocarcinoma.  The patient is currently undergoing treatment with concurrent chemoradiation was weekly carboplatin and paclitaxel status post 1 cycle.  She tolerated the first week of her treatment well except for diarrhea several days after chemotherapy and this was likely secondary to her diet. She is feeling much better today.  I recommended for her to proceed with cycle #2 today as a scheduled. I will see the patient back for follow-up visit in 2 weeks for evaluation before the next cycle of her treatment. She was advised to call immediately if she has any concerning symptoms in the interval. The patient voices understanding of current disease status and treatment options and is in agreement with the current care plan.  All questions were answered. The patient knows to call the clinic with any problems, questions or concerns. We can certainly see the patient much sooner if necessary.  I spent 10 minutes counseling the patient face to face. The total time spent in the appointment was 15 minutes.  Disclaimer: This note was dictated with voice recognition software. Similar sounding words can inadvertently be transcribed and may not be corrected upon review.

## 2017-06-08 NOTE — Patient Instructions (Signed)
[facility] Discharge Instructions for Patients Receiving Chemotherapy  Today you received the following chemotherapy agents Paclitaxel; Carboplatin  To help prevent nausea and vomiting after your treatment, we encourage you to take your nausea medication as directed  If you develop nausea and vomiting that is not controlled by your nausea medication, call the clinic.   BELOW ARE SYMPTOMS THAT SHOULD BE REPORTED IMMEDIATELY:  *FEVER GREATER THAN 100.5 F  *CHILLS WITH OR WITHOUT FEVER  NAUSEA AND VOMITING THAT IS NOT CONTROLLED WITH YOUR NAUSEA MEDICATION  *UNUSUAL SHORTNESS OF BREATH  *UNUSUAL BRUISING OR BLEEDING  TENDERNESS IN MOUTH AND THROAT WITH OR WITHOUT PRESENCE OF ULCERS  *URINARY PROBLEMS  *BOWEL PROBLEMS  UNUSUAL RASH Items with * indicate a potential emergency and should be followed up as soon as possible.  Feel free to call the clinic should you have any questions or concerns. The clinic phone number is [phone].  Please show the [facility] at check-in to the Emergency Department and triage nurse.

## 2017-06-09 ENCOUNTER — Ambulatory Visit
Admission: RE | Admit: 2017-06-09 | Discharge: 2017-06-09 | Disposition: A | Discharge status: Home / Self Care | Source: Ambulatory Visit | Attending: Radiation Oncology | Admitting: Radiation Oncology

## 2017-06-09 DIAGNOSIS — Z510 Encounter for antineoplastic radiation therapy: Secondary | ICD-10-CM | POA: Diagnosis not present

## 2017-06-10 ENCOUNTER — Ambulatory Visit
Admission: RE | Admit: 2017-06-10 | Discharge: 2017-06-10 | Disposition: A | Discharge status: Home / Self Care | Source: Ambulatory Visit | Attending: Radiation Oncology | Admitting: Radiation Oncology

## 2017-06-10 DIAGNOSIS — Z510 Encounter for antineoplastic radiation therapy: Secondary | ICD-10-CM | POA: Diagnosis not present

## 2017-06-11 ENCOUNTER — Ambulatory Visit
Admission: RE | Admit: 2017-06-11 | Discharge: 2017-06-11 | Disposition: A | Discharge status: Home / Self Care | Source: Ambulatory Visit | Attending: Radiation Oncology | Admitting: Radiation Oncology

## 2017-06-11 DIAGNOSIS — Z510 Encounter for antineoplastic radiation therapy: Secondary | ICD-10-CM | POA: Diagnosis not present

## 2017-06-12 ENCOUNTER — Ambulatory Visit
Admission: RE | Admit: 2017-06-12 | Discharge: 2017-06-12 | Disposition: A | Discharge status: Home / Self Care | Source: Ambulatory Visit | Attending: Radiation Oncology | Admitting: Radiation Oncology

## 2017-06-12 DIAGNOSIS — Z510 Encounter for antineoplastic radiation therapy: Secondary | ICD-10-CM | POA: Diagnosis not present

## 2017-06-12 NOTE — Telephone Encounter (Signed)
Close encounter 

## 2017-06-14 NOTE — Progress Notes (Signed)
  Radiation Oncology         [phone] ________________________________  Name: ILEAH FALKENSTEIN MRN: [id number]  Date: 05/28/2017  DOB: 1955/07/05  RESPIRATORY MOTION MANAGEMENT SIMULATION  NARRATIVE:  In order to account for effect of respiratory motion on target structures and other organs in the planning and delivery of radiotherapy, this patient underwent respiratory motion management simulation.  To accomplish this, when the patient was brought to the CT simulation planning suite, 4D respiratoy motion management CT images were obtained.  The CT images were loaded into the planning software.  Then, using a variety of tools including Cine, MIP, and standard views, the target volume and planning target volumes (PTV) were delineated.  Avoidance structures were contoured.  Treatment planning then occurred.  Dose volume histograms were generated and reviewed for each of the requested structure.  The resulting plan was carefully reviewed and approved today.   ------------------------------------------------  Jodelle Gross, MD, PhD

## 2017-06-14 NOTE — Progress Notes (Signed)
  Radiation Oncology         [phone] ________________________________  Name: Ashley Wilson MRN: [id number]  Date: 05/28/2017  DOB: 1955-11-22  SIMULATION AND TREATMENT PLANNING NOTE  DIAGNOSIS:     ICD-10-CM   1. Malignant neoplasm of bronchus of right upper lobe (HCC) C34.11      Site:  chest  NARRATIVE:  The patient was brought to the [facility].  Identity was confirmed.  All relevant records and images related to the planned course of therapy were reviewed.   Written consent to proceed with treatment was confirmed which was freely given after reviewing the details related to the planned course of therapy had been reviewed with the patient.  Then, the patient was set-up in a stable reproducible  supine position for radiation therapy.  CT images were obtained.  Surface markings were placed.    Medically necessary complex treatment device(s) for immobilization:  Vac-lock bag.   The CT images were loaded into the planning software.  Then the target and avoidance structures were contoured.  Treatment planning then occurred.  The radiation prescription was entered and confirmed.  A total of 4 complex treatment devices were fabricated which relate to the designed radiation treatment fields. Additional reduced fields will be used as necessary to improve the dose homogeneity of the plan. Each of these customized fields/ complex treatment devices will be used on a daily basis during the radiation course. I have requested : 3D Simulation  I have requested a DVH of the following structures: target volume, spinal cord, lungs, heart.   The patient will undergo daily image guidance to ensure accurate localization of the target, and adequate minimize dose to the normal surrounding structures in close proximity to the target.  PLAN:  The patient will receive 60 Gy in 30 fractions initially. The patient will then receive a 6 Gy boost for a final dose of 66 Gy.  Special treatment  procedure The patient will also receive concurrent chemotherapy during the treatment. The patient may therefore experience increased toxicity or side effects and the patient will be monitored for such problems. This may require extra lab work as necessary. This therefore constitutes a special treatment procedure.   ________________________________   Jodelle Gross, MD, PhD

## 2017-06-15 ENCOUNTER — Inpatient Hospital Stay

## 2017-06-15 ENCOUNTER — Ambulatory Visit
Admission: RE | Admit: 2017-06-15 | Discharge: 2017-06-15 | Disposition: A | Discharge status: Home / Self Care | Source: Ambulatory Visit | Attending: Radiation Oncology | Admitting: Radiation Oncology

## 2017-06-15 VITALS — BP 106/60 | HR 85 | Temp 98.4°F | Resp 18 | Wt 157.5 lb

## 2017-06-15 DIAGNOSIS — C3490 Malignant neoplasm of unspecified part of unspecified bronchus or lung: Secondary | ICD-10-CM

## 2017-06-15 DIAGNOSIS — C3411 Malignant neoplasm of upper lobe, right bronchus or lung: Secondary | ICD-10-CM

## 2017-06-15 DIAGNOSIS — Z5111 Encounter for antineoplastic chemotherapy: Secondary | ICD-10-CM | POA: Diagnosis not present

## 2017-06-15 DIAGNOSIS — Z510 Encounter for antineoplastic radiation therapy: Secondary | ICD-10-CM | POA: Diagnosis not present

## 2017-06-15 LAB — CBC WITH DIFFERENTIAL (CANCER CENTER ONLY)
Basophils Absolute: 0 10*3/uL (ref 0.0–0.1)
Basophils Relative: 1 %
Eosinophils Absolute: 0 10*3/uL (ref 0.0–0.5)
Eosinophils Relative: 0 %
HEMATOCRIT: 31.9 % — AB (ref 34.8–46.6)
Hemoglobin: 10 g/dL — ABNORMAL LOW (ref 11.6–15.9)
LYMPHS ABS: 0.9 10*3/uL (ref 0.9–3.3)
LYMPHS PCT: 10 %
MCH: 26.3 pg (ref 25.1–34.0)
MCHC: 31.3 g/dL — AB (ref 31.5–36.0)
MCV: 83.9 fL (ref 79.5–101.0)
MONOS PCT: 8 %
Monocytes Absolute: 0.6 10*3/uL (ref 0.1–0.9)
Neutro Abs: 6.8 10*3/uL — ABNORMAL HIGH (ref 1.5–6.5)
Neutrophils Relative %: 81 %
Platelet Count: 460 10*3/uL — ABNORMAL HIGH (ref 145–400)
RBC: 3.8 MIL/uL (ref 3.70–5.45)
RDW: 15.3 % — AB (ref 11.2–14.5)
WBC Count: 8.4 10*3/uL (ref 3.9–10.3)

## 2017-06-15 LAB — CMP (CANCER CENTER ONLY)
ALT: 6 U/L (ref 0–55)
ANION GAP: 11 (ref 3–11)
AST: 9 U/L (ref 5–34)
Albumin: 2.8 g/dL — ABNORMAL LOW (ref 3.5–5.0)
Alkaline Phosphatase: 133 U/L (ref 40–150)
BUN: 9 mg/dL (ref 7–26)
CALCIUM: 9.3 mg/dL (ref 8.4–10.4)
CO2: 22 mmol/L (ref 22–29)
Chloride: 104 mmol/L (ref 98–109)
Creatinine: 0.7 mg/dL (ref 0.60–1.10)
GFR, Est AFR Am: >60 mL/min (ref >60)
Glucose, Bld: 116 mg/dL (ref 70–140)
POTASSIUM: 3.3 mmol/L — AB (ref 3.5–5.1)
Sodium: 137 mmol/L (ref 136–145)
Total Bilirubin: 0.3 mg/dL (ref 0.2–1.2)
Total Protein: 7 g/dL (ref 6.4–8.3)

## 2017-06-15 MED ORDER — DIPHENHYDRAMINE HCL 50 MG/ML IJ SOLN
50.0000 mg | Freq: Once | INTRAMUSCULAR | Status: AC
  Administered 2017-06-15: 50 mg via INTRAVENOUS

## 2017-06-15 MED ORDER — SODIUM CHLORIDE 0.9 % IV SOLN
210.0000 mg | Freq: Once | INTRAVENOUS | Status: AC
  Administered 2017-06-15: 210 mg via INTRAVENOUS
  Filled 2017-06-15: qty 21

## 2017-06-15 MED ORDER — FAMOTIDINE IN NACL 20-0.9 MG/50ML-% IV SOLN
20.0000 mg | Freq: Once | INTRAVENOUS | Status: AC
  Administered 2017-06-15: 20 mg via INTRAVENOUS

## 2017-06-15 MED ORDER — PALONOSETRON HCL INJECTION 0.25 MG/5ML
0.2500 mg | Freq: Once | INTRAVENOUS | Status: AC
  Administered 2017-06-15: 0.25 mg via INTRAVENOUS

## 2017-06-15 MED ORDER — SODIUM CHLORIDE 0.9 % IV SOLN
Freq: Once | INTRAVENOUS | Status: AC
  Administered 2017-06-15: 10:00:00 via INTRAVENOUS

## 2017-06-15 MED ORDER — SODIUM CHLORIDE 0.9 % IV SOLN
45.0000 mg/m2 | Freq: Once | INTRAVENOUS | Status: AC
  Administered 2017-06-15: 84 mg via INTRAVENOUS
  Filled 2017-06-15: qty 14

## 2017-06-15 MED ORDER — SODIUM CHLORIDE 0.9 % IV SOLN
20.0000 mg | Freq: Once | INTRAVENOUS | Status: AC
  Administered 2017-06-15: 20 mg via INTRAVENOUS
  Filled 2017-06-15: qty 2

## 2017-06-15 MED ORDER — DIPHENHYDRAMINE HCL 50 MG/ML IJ SOLN
INTRAMUSCULAR | Status: AC
  Filled 2017-06-15: qty 1

## 2017-06-15 MED ORDER — PALONOSETRON HCL INJECTION 0.25 MG/5ML
INTRAVENOUS | Status: AC
  Filled 2017-06-15: qty 5

## 2017-06-15 MED ORDER — FAMOTIDINE IN NACL 20-0.9 MG/50ML-% IV SOLN
INTRAVENOUS | Status: AC
  Filled 2017-06-15: qty 50

## 2017-06-15 NOTE — Patient Instructions (Signed)
[facility] Discharge Instructions for Patients Receiving Chemotherapy  Today you received the following chemotherapy agents Paclitaxel, Carboplatin  To help prevent nausea and vomiting after your treatment, we encourage you to take your nausea medication as directed   If you develop nausea and vomiting that is not controlled by your nausea medication, call the clinic.   BELOW ARE SYMPTOMS THAT SHOULD BE REPORTED IMMEDIATELY:  *FEVER GREATER THAN 100.5 F  *CHILLS WITH OR WITHOUT FEVER  NAUSEA AND VOMITING THAT IS NOT CONTROLLED WITH YOUR NAUSEA MEDICATION  *UNUSUAL SHORTNESS OF BREATH  *UNUSUAL BRUISING OR BLEEDING  TENDERNESS IN MOUTH AND THROAT WITH OR WITHOUT PRESENCE OF ULCERS  *URINARY PROBLEMS  *BOWEL PROBLEMS  UNUSUAL RASH Items with * indicate a potential emergency and should be followed up as soon as possible.  Feel free to call the clinic should you have any questions or concerns. The clinic phone number is [phone].  Please show the [facility] at check-in to the Emergency Department and triage nurse.

## 2017-06-16 ENCOUNTER — Ambulatory Visit
Admission: RE | Admit: 2017-06-16 | Discharge: 2017-06-16 | Disposition: A | Discharge status: Home / Self Care | Source: Ambulatory Visit | Attending: Radiation Oncology | Admitting: Radiation Oncology

## 2017-06-16 DIAGNOSIS — Z510 Encounter for antineoplastic radiation therapy: Secondary | ICD-10-CM | POA: Diagnosis not present

## 2017-06-17 ENCOUNTER — Ambulatory Visit
Admission: RE | Admit: 2017-06-17 | Discharge: 2017-06-17 | Disposition: A | Discharge status: Home / Self Care | Source: Ambulatory Visit | Attending: Radiation Oncology | Admitting: Radiation Oncology

## 2017-06-17 DIAGNOSIS — Z510 Encounter for antineoplastic radiation therapy: Secondary | ICD-10-CM | POA: Insufficient documentation

## 2017-06-17 DIAGNOSIS — C3411 Malignant neoplasm of upper lobe, right bronchus or lung: Secondary | ICD-10-CM | POA: Diagnosis not present

## 2017-06-18 ENCOUNTER — Ambulatory Visit
Admission: RE | Admit: 2017-06-18 | Discharge: 2017-06-18 | Disposition: A | Discharge status: Home / Self Care | Source: Ambulatory Visit | Attending: Radiation Oncology | Admitting: Radiation Oncology

## 2017-06-18 DIAGNOSIS — Z510 Encounter for antineoplastic radiation therapy: Secondary | ICD-10-CM | POA: Diagnosis not present

## 2017-06-19 ENCOUNTER — Ambulatory Visit
Admission: RE | Admit: 2017-06-19 | Discharge: 2017-06-19 | Disposition: A | Discharge status: Home / Self Care | Source: Ambulatory Visit | Attending: Radiation Oncology | Admitting: Radiation Oncology

## 2017-06-19 DIAGNOSIS — Z510 Encounter for antineoplastic radiation therapy: Secondary | ICD-10-CM | POA: Diagnosis not present

## 2017-06-22 ENCOUNTER — Telehealth: Payer: Self-pay | Admitting: Oncology

## 2017-06-22 ENCOUNTER — Inpatient Hospital Stay: Attending: Internal Medicine

## 2017-06-22 ENCOUNTER — Inpatient Hospital Stay (HOSPITAL_BASED_OUTPATIENT_CLINIC_OR_DEPARTMENT_OTHER): Admitting: Oncology

## 2017-06-22 ENCOUNTER — Encounter: Payer: Self-pay | Admitting: Oncology

## 2017-06-22 ENCOUNTER — Other Ambulatory Visit: Payer: Self-pay

## 2017-06-22 ENCOUNTER — Ambulatory Visit
Admission: RE | Admit: 2017-06-22 | Discharge: 2017-06-22 | Disposition: A | Discharge status: Home / Self Care | Source: Ambulatory Visit | Attending: Radiation Oncology | Admitting: Radiation Oncology

## 2017-06-22 ENCOUNTER — Inpatient Hospital Stay

## 2017-06-22 VITALS — BP 121/66 | HR 88 | Temp 98.7°F | Resp 18 | Ht 63.0 in | Wt 155.0 lb

## 2017-06-22 DIAGNOSIS — Z5111 Encounter for antineoplastic chemotherapy: Secondary | ICD-10-CM | POA: Diagnosis present

## 2017-06-22 DIAGNOSIS — Z87891 Personal history of nicotine dependence: Secondary | ICD-10-CM | POA: Diagnosis not present

## 2017-06-22 DIAGNOSIS — C3490 Malignant neoplasm of unspecified part of unspecified bronchus or lung: Secondary | ICD-10-CM

## 2017-06-22 DIAGNOSIS — M7989 Other specified soft tissue disorders: Secondary | ICD-10-CM | POA: Insufficient documentation

## 2017-06-22 DIAGNOSIS — Z510 Encounter for antineoplastic radiation therapy: Secondary | ICD-10-CM | POA: Diagnosis not present

## 2017-06-22 DIAGNOSIS — C3411 Malignant neoplasm of upper lobe, right bronchus or lung: Secondary | ICD-10-CM | POA: Diagnosis not present

## 2017-06-22 DIAGNOSIS — R1310 Dysphagia, unspecified: Secondary | ICD-10-CM

## 2017-06-22 DIAGNOSIS — Z79899 Other long term (current) drug therapy: Secondary | ICD-10-CM | POA: Insufficient documentation

## 2017-06-22 DIAGNOSIS — I10 Essential (primary) hypertension: Secondary | ICD-10-CM | POA: Diagnosis not present

## 2017-06-22 DIAGNOSIS — B372 Candidiasis of skin and nail: Secondary | ICD-10-CM | POA: Diagnosis not present

## 2017-06-22 LAB — CMP (CANCER CENTER ONLY)
ALBUMIN: 2.8 g/dL — AB (ref 3.5–5.0)
AST: 8 U/L (ref 5–34)
Alkaline Phosphatase: 172 U/L — ABNORMAL HIGH (ref 40–150)
Anion gap: 10 (ref 3–11)
BUN: 7 mg/dL (ref 7–26)
CHLORIDE: 107 mmol/L (ref 98–109)
CO2: 24 mmol/L (ref 22–29)
CREATININE: 0.77 mg/dL (ref 0.60–1.10)
Calcium: 9.3 mg/dL (ref 8.4–10.4)
GFR, Est AFR Am: >60 mL/min (ref >60)
GFR, Estimated: >60 mL/min (ref >60)
GLUCOSE: 122 mg/dL (ref 70–140)
Potassium: 3.7 mmol/L (ref 3.5–5.1)
SODIUM: 141 mmol/L (ref 136–145)
Total Bilirubin: 0.4 mg/dL (ref 0.2–1.2)
Total Protein: 7.2 g/dL (ref 6.4–8.3)

## 2017-06-22 LAB — CBC WITH DIFFERENTIAL (CANCER CENTER ONLY)
BASOS ABS: 0.1 10*3/uL (ref 0.0–0.1)
BASOS PCT: 1 %
EOS PCT: 1 %
Eosinophils Absolute: 0 10*3/uL (ref 0.0–0.5)
HEMATOCRIT: 31 % — AB (ref 34.8–46.6)
Hemoglobin: 9.8 g/dL — ABNORMAL LOW (ref 11.6–15.9)
Lymphocytes Relative: 14 %
Lymphs Abs: 1 10*3/uL (ref 0.9–3.3)
MCH: 26.9 pg (ref 25.1–34.0)
MCHC: 31.6 g/dL (ref 31.5–36.0)
MCV: 85.2 fL (ref 79.5–101.0)
MONO ABS: 0.4 10*3/uL (ref 0.1–0.9)
MONOS PCT: 6 %
NEUTROS ABS: 5.4 10*3/uL (ref 1.5–6.5)
Neutrophils Relative %: 78 %
Platelet Count: 441 10*3/uL — ABNORMAL HIGH (ref 145–400)
RBC: 3.64 MIL/uL — ABNORMAL LOW (ref 3.70–5.45)
RDW: 16.1 % — ABNORMAL HIGH (ref 11.2–14.5)
WBC Count: 6.8 10*3/uL (ref 3.9–10.3)

## 2017-06-22 MED ORDER — SODIUM CHLORIDE 0.9 % IV SOLN
45.0000 mg/m2 | Freq: Once | INTRAVENOUS | Status: AC
  Administered 2017-06-22: 84 mg via INTRAVENOUS
  Filled 2017-06-22: qty 14

## 2017-06-22 MED ORDER — PALONOSETRON HCL INJECTION 0.25 MG/5ML
0.2500 mg | Freq: Once | INTRAVENOUS | Status: AC
  Administered 2017-06-22: 0.25 mg via INTRAVENOUS

## 2017-06-22 MED ORDER — SODIUM CHLORIDE 0.9 % IV SOLN
20.0000 mg | Freq: Once | INTRAVENOUS | Status: AC
  Administered 2017-06-22: 20 mg via INTRAVENOUS
  Filled 2017-06-22: qty 2

## 2017-06-22 MED ORDER — PALONOSETRON HCL INJECTION 0.25 MG/5ML
INTRAVENOUS | Status: AC
  Filled 2017-06-22: qty 5

## 2017-06-22 MED ORDER — SUCRALFATE 1 GM/10ML PO SUSP: 1.0000 g | Freq: Three times a day (TID) | ORAL | 0 refills | Status: DC

## 2017-06-22 MED ORDER — SODIUM CHLORIDE 0.9 % IV SOLN
210.8000 mg | Freq: Once | INTRAVENOUS | Status: AC
  Administered 2017-06-22: 210 mg via INTRAVENOUS
  Filled 2017-06-22: qty 21

## 2017-06-22 MED ORDER — DIPHENHYDRAMINE HCL 50 MG/ML IJ SOLN
50.0000 mg | Freq: Once | INTRAMUSCULAR | Status: AC
  Administered 2017-06-22: 50 mg via INTRAVENOUS

## 2017-06-22 MED ORDER — FAMOTIDINE IN NACL 20-0.9 MG/50ML-% IV SOLN
INTRAVENOUS | Status: AC
  Filled 2017-06-22: qty 50

## 2017-06-22 MED ORDER — FAMOTIDINE IN NACL 20-0.9 MG/50ML-% IV SOLN
20.0000 mg | Freq: Once | INTRAVENOUS | Status: AC
  Administered 2017-06-22: 20 mg via INTRAVENOUS

## 2017-06-22 MED ORDER — SODIUM CHLORIDE 0.9 % IV SOLN
Freq: Once | INTRAVENOUS | Status: AC
  Administered 2017-06-22: 10:00:00 via INTRAVENOUS

## 2017-06-22 MED ORDER — DIPHENHYDRAMINE HCL 50 MG/ML IJ SOLN
INTRAMUSCULAR | Status: AC
  Filled 2017-06-22: qty 1

## 2017-06-22 MED FILL — CARAFATE 1 GM/10 ML SUSP: 1 | 10 days supply | Qty: 420 | Fill #0

## 2017-06-22 NOTE — Assessment & Plan Note (Addendum)
This is a very pleasant 62 year old [name] with stage IIIB non-small cell lung cancer, adenocarcinoma.  The patient is currently undergoing treatment with concurrent chemoradiation was weekly carboplatin and paclitaxel status post 3 cycles.  She is tolerating her treatment fairly well with the exception of mild odynophagia and intermittent diarrhea that is controlled with Imodium. Recommend for her to proceed with cycle 4 of her treatment as scheduled today.  For the mild odynophagia, she was given a prescription for Carafate to be used 4 times a day.  The patient will follow-up next week for lab and chemotherapy in 2 weeks for evaluation prior to cycle #6.  She was advised to call immediately if she has any concerning symptoms in the interval. The patient voices understanding of current disease status and treatment options and is in agreement with the current care plan.  All questions were answered. The patient knows to call the clinic with any problems, questions or concerns. We can certainly see the patient much sooner if necessary.

## 2017-06-22 NOTE — Patient Instructions (Signed)
   [facility] Discharge Instructions for Patients Receiving Chemotherapy  Today you received the following chemotherapy agents Taxol and Carboplatin   To help prevent nausea and vomiting after your treatment, we encourage you to take your nausea medication as directed.    If you develop nausea and vomiting that is not controlled by your nausea medication, call the clinic.   BELOW ARE SYMPTOMS THAT SHOULD BE REPORTED IMMEDIATELY:  *FEVER GREATER THAN 100.5 F  *CHILLS WITH OR WITHOUT FEVER  NAUSEA AND VOMITING THAT IS NOT CONTROLLED WITH YOUR NAUSEA MEDICATION  *UNUSUAL SHORTNESS OF BREATH  *UNUSUAL BRUISING OR BLEEDING  TENDERNESS IN MOUTH AND THROAT WITH OR WITHOUT PRESENCE OF ULCERS  *URINARY PROBLEMS  *BOWEL PROBLEMS  UNUSUAL RASH Items with * indicate a potential emergency and should be followed up as soon as possible.  Feel free to call the clinic should you have any questions or concerns. The clinic phone number is [phone].  Please show the CHEMO ALERT CARD at check-in to the Emergency Department and triage nurse.   

## 2017-06-22 NOTE — Progress Notes (Signed)
[facility] OFFICE PROGRESS NOTE  [name], MD [address] [place] [address]  DIAGNOSIS: stageIIIB (T3, N2, M0)non-small cell lung cancer, adenocarcinoma diagnosed in March 2019 and presented with large right upper lobe lung mass in addition to right hilar and mediastinal lymphadenopathy.  PD-L10%  PRIOR THERAPY: None  CURRENT THERAPY: Concurrent chemoradiation with weekly carboplatin for an AUC of 2 and paclitaxel 45 mg/m2. First dose expected on 06/01/2017.  Status post 3 cycles.  INTERVAL HISTORY: Ashley Wilson 62 y.o. female returns for routine follow-up visit accompanied by her daughter.  The patient is feeling fine today and has no specific complaints except for mild odynophagia and intermittent diarrhea that is controlled with Imodium.  Patient denies fevers and chills.  Denies chest pain, shortness breath, cough, hemoptysis.  Denies nausea, vomiting, constipation.  She continues have swelling to her left hand which she thinks is improving.  The patient continues to tolerate her treatment fairly well.  The patient is here for evaluation prior to cycle #4.  MEDICAL HISTORY: Past Medical History:  Diagnosis Date  . Compression fracture of lumbar vertebra ([place])   . Hypertension     ALLERGIES:  has No Known Allergies.  MEDICATIONS:  Current Outpatient Medications  Medication Sig Dispense Refill  . acetaminophen (TYLENOL) 500 MG tablet Take 1,000 mg by mouth every 6 (six) hours as needed for mild pain or moderate pain.    Marland Kitchen amLODipine (NORVASC) 10 MG tablet Take 10 mg by mouth daily.    Marland Kitchen loperamide (IMODIUM) 2 MG capsule Take 4 mg by mouth as needed for diarrhea or loose stools.    Marland Kitchen spironolactone (ALDACTONE) 25 MG tablet Take 25 mg by mouth daily.    . cetirizine (ZYRTEC) 10 MG tablet Take 10 mg by mouth daily as needed for allergies.    Marland Kitchen prochlorperazine (COMPAZINE) 10 MG tablet Take 1 tablet (10 mg total) by mouth every 6 (six) hours as needed  for nausea or vomiting. (Patient not taking: Reported on 06/08/2017) 30 tablet 0  . sucralfate (CARAFATE) 1 GM/10ML suspension Take 10 mLs (1 g total) by mouth 4 (four) times daily -  with meals and at bedtime. 420 mL 0   No current facility-administered medications for this visit.     SURGICAL HISTORY:  Past Surgical History:  Procedure Laterality Date  . BACK SURGERY  2009   ruptured disc    REVIEW OF SYSTEMS:   Review of Systems  Constitutional: Negative for appetite change, chills, fatigue, fever and unexpected weight change.  HENT:   Negative for mouth sores, nosebleeds, sore throat and trouble swallowing.   Eyes: Negative for eye problems and icterus.  Respiratory: Negative for cough, hemoptysis, shortness of breath and wheezing.   Cardiovascular: Negative for chest pain and leg swelling.  Gastrointestinal: Negative for abdominal pain, constipation, nausea and vomiting. Positive for diarrhea that is controlled with Imodium. Genitourinary: Negative for bladder incontinence, difficulty urinating, dysuria, frequency and hematuria.   Musculoskeletal: Negative for back pain, gait problem, neck pain and neck stiffness.  Skin: Negative for itching and rash.  Neurological: Negative for dizziness, extremity weakness, gait problem, headaches, light-headedness and seizures.  Hematological: Negative for adenopathy. Does not bruise/bleed easily.  Psychiatric/Behavioral: Negative for confusion, depression and sleep disturbance. The patient is not nervous/anxious.     PHYSICAL EXAMINATION:  Blood pressure 121/66, pulse 88, temperature 98.7 F (37.1 C), temperature source Oral, resp. rate 18, height 5' 3"  (1.6 m), weight 155 lb (  70.3 kg), SpO2 100 %.  ECOG PERFORMANCE STATUS: 1 - Symptomatic but completely ambulatory  Physical Exam  Constitutional: Oriented to person, place, and time and well-developed, well-nourished, and in no distress. No distress.  HENT:  Head: Normocephalic and  atraumatic.  Mouth/Throat: Oropharynx is clear and moist. No oropharyngeal exudate.  Eyes: Conjunctivae are normal. Right eye exhibits no discharge. Left eye exhibits no discharge. No scleral icterus.  Neck: Normal range of motion. Neck supple.  Cardiovascular: Normal rate, regular rhythm, normal heart sounds and intact distal pulses.  Trace edema to her left hand.  She has a compression glove in place. Pulmonary/Chest: Effort normal and breath sounds normal. No respiratory distress. No wheezes. No rales.  Abdominal: Soft. Bowel sounds are normal. Exhibits no distension and no mass. There is no tenderness.  Musculoskeletal: Normal range of motion. Exhibits no edema.  Lymphadenopathy:    No cervical adenopathy.  Neurological: Alert and oriented to person, place, and time. Exhibits normal muscle tone. Gait normal. Coordination normal.  Skin: Skin is warm and dry. No rash noted. Not diaphoretic. No erythema. No pallor. Clubbing to her fingernails noted. Psychiatric: Mood, memory and judgment normal.  Vitals reviewed.  LABORATORY DATA: Lab Results  Component Value Date   WBC 6.8 06/22/2017   HGB 9.8 (L) 06/22/2017   HCT 31.0 (L) 06/22/2017   MCV 85.2 06/22/2017   PLT 441 (H) 06/22/2017      Chemistry      Component Value Date/Time   NA 141 06/22/2017 0804   K 3.7 06/22/2017 0804   CL 107 06/22/2017 0804   CO2 24 06/22/2017 0804   BUN 7 06/22/2017 0804   CREATININE 0.77 06/22/2017 0804      Component Value Date/Time   CALCIUM 9.3 06/22/2017 0804   ALKPHOS 172 (H) 06/22/2017 0804   AST 8 06/22/2017 0804   ALT <6 06/22/2017 0804   BILITOT 0.4 06/22/2017 0804       RADIOGRAPHIC STUDIES:  No results found.   ASSESSMENT/PLAN:  Malignant neoplasm of bronchus of upper lobe (HCC) This is a very pleasant 62 year old [name] with stage IIIB non-small cell lung cancer, adenocarcinoma.  The patient is currently undergoing treatment with concurrent chemoradiation was  weekly carboplatin and paclitaxel status post 3 cycles.  She is tolerating her treatment fairly well with the exception of mild odynophagia and intermittent diarrhea that is controlled with Imodium. Recommend for her to proceed with cycle 4 of her treatment as scheduled today.  For the mild odynophagia, she was given a prescription for Carafate to be used 4 times a day.  The patient will follow-up next week for lab and chemotherapy in 2 weeks for evaluation prior to cycle #6.  She was advised to call immediately if she has any concerning symptoms in the interval. The patient voices understanding of current disease status and treatment options and is in agreement with the current care plan.  All questions were answered. The patient knows to call the clinic with any problems, questions or concerns. We can certainly see the patient much sooner if necessary.   No orders of the defined types were placed in this encounter.  Mikey Bussing, DNP, AGPCNP-BC, AOCNP 06/22/17

## 2017-06-22 NOTE — Telephone Encounter (Signed)
Appts already scheduled per 5/6 los.

## 2017-06-23 ENCOUNTER — Ambulatory Visit
Admission: RE | Admit: 2017-06-23 | Discharge: 2017-06-23 | Disposition: A | Discharge status: Home / Self Care | Source: Ambulatory Visit | Attending: Radiation Oncology | Admitting: Radiation Oncology

## 2017-06-23 DIAGNOSIS — Z510 Encounter for antineoplastic radiation therapy: Secondary | ICD-10-CM | POA: Diagnosis not present

## 2017-06-24 ENCOUNTER — Ambulatory Visit
Admission: RE | Admit: 2017-06-24 | Discharge: 2017-06-24 | Disposition: A | Discharge status: Home / Self Care | Source: Ambulatory Visit | Attending: Radiation Oncology | Admitting: Radiation Oncology

## 2017-06-24 DIAGNOSIS — Z510 Encounter for antineoplastic radiation therapy: Secondary | ICD-10-CM | POA: Diagnosis not present

## 2017-06-25 ENCOUNTER — Ambulatory Visit
Admission: RE | Admit: 2017-06-25 | Discharge: 2017-06-25 | Disposition: A | Discharge status: Home / Self Care | Source: Ambulatory Visit | Attending: Radiation Oncology | Admitting: Radiation Oncology

## 2017-06-25 DIAGNOSIS — Z510 Encounter for antineoplastic radiation therapy: Secondary | ICD-10-CM | POA: Diagnosis not present

## 2017-06-26 ENCOUNTER — Ambulatory Visit
Admission: RE | Admit: 2017-06-26 | Discharge: 2017-06-26 | Disposition: A | Discharge status: Home / Self Care | Source: Ambulatory Visit | Attending: Radiation Oncology | Admitting: Radiation Oncology

## 2017-06-26 DIAGNOSIS — Z510 Encounter for antineoplastic radiation therapy: Secondary | ICD-10-CM | POA: Diagnosis not present

## 2017-06-29 ENCOUNTER — Inpatient Hospital Stay: Admitting: Nutrition

## 2017-06-29 ENCOUNTER — Inpatient Hospital Stay

## 2017-06-29 ENCOUNTER — Ambulatory Visit
Admission: RE | Admit: 2017-06-29 | Discharge: 2017-06-29 | Disposition: A | Discharge status: Home / Self Care | Source: Ambulatory Visit | Attending: Radiation Oncology | Admitting: Radiation Oncology

## 2017-06-29 VITALS — BP 118/55 | HR 78 | Temp 98.5°F | Resp 16

## 2017-06-29 DIAGNOSIS — C3490 Malignant neoplasm of unspecified part of unspecified bronchus or lung: Secondary | ICD-10-CM

## 2017-06-29 DIAGNOSIS — Z510 Encounter for antineoplastic radiation therapy: Secondary | ICD-10-CM | POA: Diagnosis not present

## 2017-06-29 DIAGNOSIS — Z5111 Encounter for antineoplastic chemotherapy: Secondary | ICD-10-CM | POA: Diagnosis not present

## 2017-06-29 DIAGNOSIS — C3411 Malignant neoplasm of upper lobe, right bronchus or lung: Secondary | ICD-10-CM

## 2017-06-29 LAB — CBC WITH DIFFERENTIAL (CANCER CENTER ONLY)
BASOS ABS: 0.1 10*3/uL (ref 0.0–0.1)
Basophils Relative: 1 %
Eosinophils Absolute: 0 10*3/uL (ref 0.0–0.5)
Eosinophils Relative: 0 %
HEMATOCRIT: 31.7 % — AB (ref 34.8–46.6)
HEMOGLOBIN: 10.4 g/dL — AB (ref 11.6–15.9)
LYMPHS ABS: 0.4 10*3/uL — AB (ref 0.9–3.3)
LYMPHS PCT: 9 %
MCH: 27.1 pg (ref 25.1–34.0)
MCHC: 32.8 g/dL (ref 31.5–36.0)
MCV: 82.6 fL (ref 79.5–101.0)
Monocytes Absolute: 0.5 10*3/uL (ref 0.1–0.9)
Monocytes Relative: 9 %
NEUTROS ABS: 4.1 10*3/uL (ref 1.5–6.5)
Neutrophils Relative %: 81 %
Platelet Count: 371 10*3/uL (ref 145–400)
RBC: 3.84 MIL/uL (ref 3.70–5.45)
RDW: 16 % — ABNORMAL HIGH (ref 11.2–14.5)
WBC: 5.1 10*3/uL (ref 3.9–10.3)

## 2017-06-29 LAB — CMP (CANCER CENTER ONLY)
ALT: 9 U/L (ref 0–55)
ANION GAP: 10 (ref 3–11)
AST: 8 U/L (ref 5–34)
Albumin: 2.9 g/dL — ABNORMAL LOW (ref 3.5–5.0)
Alkaline Phosphatase: 172 U/L — ABNORMAL HIGH (ref 40–150)
BILIRUBIN TOTAL: 0.4 mg/dL (ref 0.2–1.2)
BUN: 11 mg/dL (ref 7–26)
CHLORIDE: 107 mmol/L (ref 98–109)
CO2: 23 mmol/L (ref 22–29)
Calcium: 9.3 mg/dL (ref 8.4–10.4)
Creatinine: 0.79 mg/dL (ref 0.60–1.10)
GFR, Est AFR Am: >60 mL/min (ref >60)
Glucose, Bld: 107 mg/dL (ref 70–140)
POTASSIUM: 4.2 mmol/L (ref 3.5–5.1)
Sodium: 140 mmol/L (ref 136–145)
TOTAL PROTEIN: 7.2 g/dL (ref 6.4–8.3)

## 2017-06-29 MED ORDER — DIPHENHYDRAMINE HCL 50 MG/ML IJ SOLN
50.0000 mg | Freq: Once | INTRAMUSCULAR | Status: AC
  Administered 2017-06-29: 50 mg via INTRAVENOUS

## 2017-06-29 MED ORDER — SODIUM CHLORIDE 0.9 % IV SOLN
20.0000 mg | Freq: Once | INTRAVENOUS | Status: AC
  Administered 2017-06-29: 20 mg via INTRAVENOUS
  Filled 2017-06-29: qty 2

## 2017-06-29 MED ORDER — FAMOTIDINE IN NACL 20-0.9 MG/50ML-% IV SOLN
INTRAVENOUS | Status: AC
  Filled 2017-06-29: qty 50

## 2017-06-29 MED ORDER — SODIUM CHLORIDE 0.9 % IV SOLN
Freq: Once | INTRAVENOUS | Status: AC
  Administered 2017-06-29: 09:00:00 via INTRAVENOUS

## 2017-06-29 MED ORDER — SODIUM CHLORIDE 0.9 % IV SOLN
45.0000 mg/m2 | Freq: Once | INTRAVENOUS | Status: AC
  Administered 2017-06-29: 84 mg via INTRAVENOUS
  Filled 2017-06-29: qty 14

## 2017-06-29 MED ORDER — DIPHENHYDRAMINE HCL 50 MG/ML IJ SOLN
INTRAMUSCULAR | Status: AC
  Filled 2017-06-29: qty 1

## 2017-06-29 MED ORDER — FAMOTIDINE IN NACL 20-0.9 MG/50ML-% IV SOLN
20.0000 mg | Freq: Once | INTRAVENOUS | Status: AC
  Administered 2017-06-29: 20 mg via INTRAVENOUS

## 2017-06-29 MED ORDER — SODIUM CHLORIDE 0.9 % IV SOLN
210.8000 mg | Freq: Once | INTRAVENOUS | Status: AC
  Administered 2017-06-29: 210 mg via INTRAVENOUS
  Filled 2017-06-29: qty 21

## 2017-06-29 MED ORDER — PALONOSETRON HCL INJECTION 0.25 MG/5ML
INTRAVENOUS | Status: AC
  Filled 2017-06-29: qty 5

## 2017-06-29 MED ORDER — PALONOSETRON HCL INJECTION 0.25 MG/5ML
0.2500 mg | Freq: Once | INTRAVENOUS | Status: AC
  Administered 2017-06-29: 0.25 mg via INTRAVENOUS

## 2017-06-29 NOTE — Patient Instructions (Signed)
   [facility] Discharge Instructions for Patients Receiving Chemotherapy  Today you received the following chemotherapy agents Taxol and Carboplatin   To help prevent nausea and vomiting after your treatment, we encourage you to take your nausea medication as directed.    If you develop nausea and vomiting that is not controlled by your nausea medication, call the clinic.   BELOW ARE SYMPTOMS THAT SHOULD BE REPORTED IMMEDIATELY:  *FEVER GREATER THAN 100.5 F  *CHILLS WITH OR WITHOUT FEVER  NAUSEA AND VOMITING THAT IS NOT CONTROLLED WITH YOUR NAUSEA MEDICATION  *UNUSUAL SHORTNESS OF BREATH  *UNUSUAL BRUISING OR BLEEDING  TENDERNESS IN MOUTH AND THROAT WITH OR WITHOUT PRESENCE OF ULCERS  *URINARY PROBLEMS  *BOWEL PROBLEMS  UNUSUAL RASH Items with * indicate a potential emergency and should be followed up as soon as possible.  Feel free to call the clinic should you have any questions or concerns. The clinic phone number is [phone].  Please show the CHEMO ALERT CARD at check-in to the Emergency Department and triage nurse.   

## 2017-06-29 NOTE — Progress Notes (Signed)
Patient complained of burning to right hand during infusion of Taxol.  IV site is WNL.  Patient denied pain around IV site.  Taxol was paused, NS ran at same rate.  Burning subsided.  Taxol restarted at same rate, burning to right hand returned.  Taxol paused.  Nurse checked for blood return to IV, blood return noted.  Warm towels applied to right arm.  Taxol restarted and patient stated burning returned. Nurse notified pharmacy, okay to decrease rate of Taxol.  Patient tolerating infusion well at this time.  Will continue to monitor.

## 2017-06-29 NOTE — Progress Notes (Signed)
62 year old female diagnosed with non-small cell lung cancer.  She is a patient of Dr. Julien Nordmann.  Past medical history includes hypertension.  Medications include Imodium and Compazine.  Labs include albumin 2.9.  Height: 63 inches. Weight: 155 pounds. Usual body weight: 166 pounds in March. BMI: 27.46.  Patient is receiving concurrent chemoradiation therapy. Patient states she has mild odynophagia for which she was prescribed Carafate.  She has not taken this yet.  Reports difficulty only swallowing large pills. Reports diarrhea that is now controlled with Imodium. Patient reports weight loss occurred during times of diarrhea. She was enjoying boost high protein however was told not to drink it as it might be contributing to diarrhea.  Patient also told to avoid dairy products. Patient reports she now has increased appetite and is eating lots better.  Nutrition diagnosis:  Food and nutrition related knowledge deficit related to non-small cell lung cancer and associated treatments as evidenced by no prior need for nutrition related information.  Intervention: Educated patient on strategies for easier swallowing.  Encouraged her to take Carafate as needed. Reviewed strategies for improving diarrhea. Encouraged high-calorie high-protein foods and 6 small meals daily. Encourage patient to try boost high-protein again as this is lactose-free. Fact sheets were given.  Questions were answered.  Teach back method used.  Contact information provided.  Monitoring, evaluation, goals: Patient will tolerate adequate calories and protein to achieve weight maintenance.  Next visit: Patient will contact me for questions or concerns.  **Disclaimer: This note was dictated with voice recognition software. Similar sounding words can inadvertently be transcribed and this note may contain transcription errors which may not have been corrected upon publication of note.**

## 2017-06-30 ENCOUNTER — Ambulatory Visit
Admission: RE | Admit: 2017-06-30 | Discharge: 2017-06-30 | Disposition: A | Discharge status: Home / Self Care | Source: Ambulatory Visit | Attending: Radiation Oncology | Admitting: Radiation Oncology

## 2017-06-30 DIAGNOSIS — Z510 Encounter for antineoplastic radiation therapy: Secondary | ICD-10-CM | POA: Diagnosis not present

## 2017-07-01 ENCOUNTER — Ambulatory Visit
Admission: RE | Admit: 2017-07-01 | Discharge: 2017-07-01 | Disposition: A | Discharge status: Home / Self Care | Source: Ambulatory Visit | Attending: Radiation Oncology | Admitting: Radiation Oncology

## 2017-07-01 DIAGNOSIS — Z510 Encounter for antineoplastic radiation therapy: Secondary | ICD-10-CM | POA: Diagnosis not present

## 2017-07-02 ENCOUNTER — Ambulatory Visit
Admission: RE | Admit: 2017-07-02 | Discharge: 2017-07-02 | Disposition: A | Discharge status: Home / Self Care | Source: Ambulatory Visit | Attending: Radiation Oncology | Admitting: Radiation Oncology

## 2017-07-02 ENCOUNTER — Encounter: Payer: Self-pay | Admitting: Internal Medicine

## 2017-07-02 DIAGNOSIS — Z510 Encounter for antineoplastic radiation therapy: Secondary | ICD-10-CM | POA: Diagnosis not present

## 2017-07-02 NOTE — Progress Notes (Signed)
Called patient back to follow up on concern regarding assistance for treatment. Dee discussed the conversation she had with the patient with me and I called her back as a follow up. Patient was very irate and upset that we are calling her. I tried to explain that I was following up because she had hung up before [place] got back to the phone. She states she was on hold for a long time and she did not want to talk to me or anyone else by phone and to just send her communication through the mail and hung up the phone.

## 2017-07-03 ENCOUNTER — Ambulatory Visit
Admission: RE | Admit: 2017-07-03 | Discharge: 2017-07-03 | Disposition: A | Discharge status: Home / Self Care | Source: Ambulatory Visit | Attending: Radiation Oncology | Admitting: Radiation Oncology

## 2017-07-03 DIAGNOSIS — Z510 Encounter for antineoplastic radiation therapy: Secondary | ICD-10-CM | POA: Diagnosis not present

## 2017-07-06 ENCOUNTER — Inpatient Hospital Stay

## 2017-07-06 ENCOUNTER — Inpatient Hospital Stay (HOSPITAL_BASED_OUTPATIENT_CLINIC_OR_DEPARTMENT_OTHER): Admitting: Internal Medicine

## 2017-07-06 ENCOUNTER — Encounter: Payer: Self-pay | Admitting: Internal Medicine

## 2017-07-06 ENCOUNTER — Ambulatory Visit
Admission: RE | Admit: 2017-07-06 | Discharge: 2017-07-06 | Disposition: A | Discharge status: Home / Self Care | Source: Ambulatory Visit | Attending: Radiation Oncology | Admitting: Radiation Oncology

## 2017-07-06 VITALS — BP 134/65 | HR 91 | Temp 98.5°F | Resp 18 | Ht 63.0 in | Wt 155.7 lb

## 2017-07-06 DIAGNOSIS — C3411 Malignant neoplasm of upper lobe, right bronchus or lung: Secondary | ICD-10-CM

## 2017-07-06 DIAGNOSIS — Z5111 Encounter for antineoplastic chemotherapy: Secondary | ICD-10-CM

## 2017-07-06 DIAGNOSIS — Z79899 Other long term (current) drug therapy: Secondary | ICD-10-CM

## 2017-07-06 DIAGNOSIS — R1310 Dysphagia, unspecified: Secondary | ICD-10-CM | POA: Diagnosis not present

## 2017-07-06 DIAGNOSIS — Z510 Encounter for antineoplastic radiation therapy: Secondary | ICD-10-CM | POA: Diagnosis not present

## 2017-07-06 DIAGNOSIS — I10 Essential (primary) hypertension: Secondary | ICD-10-CM

## 2017-07-06 DIAGNOSIS — C3490 Malignant neoplasm of unspecified part of unspecified bronchus or lung: Secondary | ICD-10-CM

## 2017-07-06 DIAGNOSIS — M7989 Other specified soft tissue disorders: Secondary | ICD-10-CM | POA: Diagnosis not present

## 2017-07-06 LAB — CBC WITH DIFFERENTIAL (CANCER CENTER ONLY)
Basophils Absolute: 0 10*3/uL (ref 0.0–0.1)
Basophils Relative: 1 %
EOS ABS: 0 10*3/uL (ref 0.0–0.5)
Eosinophils Relative: 1 %
HCT: 28.7 % — ABNORMAL LOW (ref 34.8–46.6)
HEMOGLOBIN: 9.4 g/dL — AB (ref 11.6–15.9)
LYMPHS ABS: 0.5 10*3/uL — AB (ref 0.9–3.3)
LYMPHS PCT: 11 %
MCH: 27.3 pg (ref 25.1–34.0)
MCHC: 32.7 g/dL (ref 31.5–36.0)
MCV: 83.5 fL (ref 79.5–101.0)
MONOS PCT: 8 %
Monocytes Absolute: 0.4 10*3/uL (ref 0.1–0.9)
NEUTROS PCT: 79 %
Neutro Abs: 3.6 10*3/uL (ref 1.5–6.5)
Platelet Count: 375 10*3/uL (ref 145–400)
RBC: 3.44 MIL/uL — AB (ref 3.70–5.45)
RDW: 17 % — ABNORMAL HIGH (ref 11.2–14.5)
WBC: 4.6 10*3/uL (ref 3.9–10.3)

## 2017-07-06 LAB — CMP (CANCER CENTER ONLY)
ALT: 11 U/L (ref 0–55)
AST: 9 U/L (ref 5–34)
Albumin: 2.9 g/dL — ABNORMAL LOW (ref 3.5–5.0)
Alkaline Phosphatase: 160 U/L — ABNORMAL HIGH (ref 40–150)
Anion gap: 12 — ABNORMAL HIGH (ref 3–11)
BUN: 12 mg/dL (ref 7–26)
CHLORIDE: 109 mmol/L (ref 98–109)
CO2: 20 mmol/L — AB (ref 22–29)
Calcium: 9 mg/dL (ref 8.4–10.4)
Creatinine: 0.75 mg/dL (ref 0.60–1.10)
GFR, Est AFR Am: >60 mL/min (ref >60)
GLUCOSE: 111 mg/dL (ref 70–140)
Potassium: 4.1 mmol/L (ref 3.5–5.1)
SODIUM: 141 mmol/L (ref 136–145)
Total Bilirubin: 0.2 mg/dL (ref 0.2–1.2)
Total Protein: 6.9 g/dL (ref 6.4–8.3)

## 2017-07-06 MED ORDER — SODIUM CHLORIDE 0.9 % IV SOLN
Freq: Once | INTRAVENOUS | Status: AC
  Administered 2017-07-06: 10:00:00 via INTRAVENOUS

## 2017-07-06 MED ORDER — SODIUM CHLORIDE 0.9 % IV SOLN
210.8000 mg | Freq: Once | INTRAVENOUS | Status: AC
  Administered 2017-07-06: 210 mg via INTRAVENOUS
  Filled 2017-07-06: qty 21

## 2017-07-06 MED ORDER — DIPHENHYDRAMINE HCL 50 MG/ML IJ SOLN
50.0000 mg | Freq: Once | INTRAMUSCULAR | Status: AC
  Administered 2017-07-06: 50 mg via INTRAVENOUS

## 2017-07-06 MED ORDER — FAMOTIDINE IN NACL 20-0.9 MG/50ML-% IV SOLN
INTRAVENOUS | Status: AC
  Filled 2017-07-06: qty 50

## 2017-07-06 MED ORDER — DIPHENHYDRAMINE HCL 50 MG/ML IJ SOLN
INTRAMUSCULAR | Status: AC
  Filled 2017-07-06: qty 1

## 2017-07-06 MED ORDER — PALONOSETRON HCL INJECTION 0.25 MG/5ML
0.2500 mg | Freq: Once | INTRAVENOUS | Status: AC
  Administered 2017-07-06: 0.25 mg via INTRAVENOUS

## 2017-07-06 MED ORDER — PALONOSETRON HCL INJECTION 0.25 MG/5ML
INTRAVENOUS | Status: AC
  Filled 2017-07-06: qty 5

## 2017-07-06 MED ORDER — SODIUM CHLORIDE 0.9 % IV SOLN
20.0000 mg | Freq: Once | INTRAVENOUS | Status: AC
  Administered 2017-07-06: 20 mg via INTRAVENOUS
  Filled 2017-07-06: qty 2

## 2017-07-06 MED ORDER — FAMOTIDINE IN NACL 20-0.9 MG/50ML-% IV SOLN
20.0000 mg | Freq: Once | INTRAVENOUS | Status: AC
  Administered 2017-07-06: 20 mg via INTRAVENOUS

## 2017-07-06 MED ORDER — SODIUM CHLORIDE 0.9 % IV SOLN
45.0000 mg/m2 | Freq: Once | INTRAVENOUS | Status: AC
  Administered 2017-07-06: 84 mg via INTRAVENOUS
  Filled 2017-07-06: qty 14

## 2017-07-06 NOTE — Patient Instructions (Signed)
   [facility] Discharge Instructions for Patients Receiving Chemotherapy  Today you received the following chemotherapy agents Taxol and Carboplatin   To help prevent nausea and vomiting after your treatment, we encourage you to take your nausea medication as directed.    If you develop nausea and vomiting that is not controlled by your nausea medication, call the clinic.   BELOW ARE SYMPTOMS THAT SHOULD BE REPORTED IMMEDIATELY:  *FEVER GREATER THAN 100.5 F  *CHILLS WITH OR WITHOUT FEVER  NAUSEA AND VOMITING THAT IS NOT CONTROLLED WITH YOUR NAUSEA MEDICATION  *UNUSUAL SHORTNESS OF BREATH  *UNUSUAL BRUISING OR BLEEDING  TENDERNESS IN MOUTH AND THROAT WITH OR WITHOUT PRESENCE OF ULCERS  *URINARY PROBLEMS  *BOWEL PROBLEMS  UNUSUAL RASH Items with * indicate a potential emergency and should be followed up as soon as possible.  Feel free to call the clinic should you have any questions or concerns. The clinic phone number is [phone].  Please show the CHEMO ALERT CARD at check-in to the Emergency Department and triage nurse.   

## 2017-07-06 NOTE — Progress Notes (Signed)
[facility] [phone]   [phone]  OFFICE PROGRESS NOTE  [name], MD [address] [place] [address]  DIAGNOSIS: stageIIIB (T3, N2, M0)non-small cell lung cancer, adenocarcinoma diagnosed in March 2019 and presented with large right upper lobe lung mass in addition to right hilar and mediastinal lymphadenopathy.  PD-L1 0%  PRIOR THERAPY: None  CURRENT THERAPY: Concurrent chemoradiation with weekly carboplatin for an AUC of 2 and paclitaxel 45 mg/m2.  First dose expected on 06/01/2017.  Status post 5 cycles.   INTERVAL HISTORY: Ashley Wilson 62 y.o. female returns to the clinic today for follow-up visit accompanied by her daughter.  The patient is feeling fine today with no specific complaints.  She continues to tolerate her course of concurrent chemoradiation fairly well.  She continues to have the swelling of the left hand.  She denied having any chest pain, shortness breath, cough or hemoptysis.  She denied having any fever or chills.  She has no nausea, vomiting, diarrhea or constipation.  She denied having any weight loss or night sweats.  She is here today for evaluation before starting cycle #6 of her treatment.   MEDICAL HISTORY: Past Medical History:  Diagnosis Date  . Compression fracture of lumbar vertebra ([place])   . Hypertension     ALLERGIES:  has No Known Allergies.  MEDICATIONS:  Current Outpatient Medications  Medication Sig Dispense Refill  . acetaminophen (TYLENOL) 500 MG tablet Take 1,000 mg by mouth every 6 (six) hours as needed for mild pain or moderate pain.    Marland Kitchen amLODipine (NORVASC) 10 MG tablet Take 10 mg by mouth daily.    . cetirizine (ZYRTEC) 10 MG tablet Take 10 mg by mouth daily as needed for allergies.    Marland Kitchen loperamide (IMODIUM) 2 MG capsule Take 4 mg by mouth as needed for diarrhea or loose stools.    . prochlorperazine (COMPAZINE) 10 MG tablet Take 1 tablet (10 mg total) by mouth every 6  (six) hours as needed for nausea or vomiting. (Patient not taking: Reported on 06/08/2017) 30 tablet 0  . spironolactone (ALDACTONE) 25 MG tablet Take 25 mg by mouth daily.    . sucralfate (CARAFATE) 1 GM/10ML suspension Take 10 mLs (1 g total) by mouth 4 (four) times daily -  with meals and at bedtime. 420 mL 0   No current facility-administered medications for this visit.     SURGICAL HISTORY:  Past Surgical History:  Procedure Laterality Date  . BACK SURGERY  2009   ruptured disc    REVIEW OF SYSTEMS:  A comprehensive review of systems was negative except for: Constitutional: positive for fatigue Musculoskeletal: positive for arthralgias   PHYSICAL EXAMINATION: General appearance: alert, cooperative, fatigued and no distress Head: Normocephalic, without obvious abnormality, atraumatic Neck: no adenopathy, no JVD, supple, symmetrical, trachea midline and thyroid not enlarged, symmetric, no tenderness/mass/nodules Lymph nodes: Cervical, supraclavicular, and axillary nodes normal. Resp: clear to auscultation bilaterally Back: symmetric, no curvature. ROM normal. No CVA tenderness. Cardio: regular rate and rhythm, S1, S2 normal, no murmur, click, rub or gallop GI: soft, non-tender; bowel sounds normal; no masses,  no organomegaly Extremities: extremities normal, atraumatic, no cyanosis or edema  ECOG PERFORMANCE STATUS: 1 - Symptomatic but completely ambulatory  Blood pressure 134/65, pulse 91, temperature 98.5 F (36.9 C), temperature source Oral, resp. rate 18, height _0  (1.6 m), weight 155 lb 11.2 oz (70.6 kg), SpO2 100 %.  LABORATORY DATA:  Lab Results  Component Value Date   WBC 4.6 07/06/2017   HGB 9.4 (L) 07/06/2017   HCT 28.7 (L) 07/06/2017   MCV 83.5 07/06/2017   PLT 375 07/06/2017      Chemistry      Component Value Date/Time   NA 141 07/06/2017 0813   K 4.1 07/06/2017 0813   CL 109 07/06/2017 0813   CO2 20 (L) 07/06/2017 0813   BUN 12 07/06/2017 0813    CREATININE 0.75 07/06/2017 0813      Component Value Date/Time   CALCIUM 9.0 07/06/2017 0813   ALKPHOS 160 (H) 07/06/2017 0813   AST 9 07/06/2017 0813   ALT 11 07/06/2017 0813   BILITOT 0.2 07/06/2017 0813       RADIOGRAPHIC STUDIES: No results found.  ASSESSMENT AND PLAN: This is a very pleasant 62 years old [name] with stage IIIB non-small cell lung cancer, adenocarcinoma.  The patient is currently undergoing treatment with concurrent chemoradiation was weekly carboplatin and paclitaxel status post 5 cycles.   The patient continues to tolerate this treatment well with no concerning complaints. I recommended for her to proceed with cycle #6 today as a scheduled. I will see her back for follow-up visit in 2 weeks for evaluation close to the end of her treatment for management of any adverse effect and to schedule her scans. She was advised to call immediately if she has any concerning symptoms in the interval. The patient voices understanding of current disease status and treatment options and is in agreement with the current care plan.  All questions were answered. The patient knows to call the clinic with any problems, questions or concerns. We can certainly see the patient much sooner if necessary.  I spent 10 minutes counseling the patient face to face. The total time spent in the appointment was 15 minutes.  Disclaimer: This note was dictated with voice recognition software. Similar sounding words can inadvertently be transcribed and may not be corrected upon review.

## 2017-07-06 NOTE — Progress Notes (Signed)
At 11:00 patient c/o burning at IV site.  IV site is with no edmea, or redness.  Patient denied pain around IV site.  Taxol infusion was paused.  IV check, blood returned noted.  Taxol was restarted at slower rate, 258ml/hr.  Patient tolerating well, will continue to monitor.

## 2017-07-07 ENCOUNTER — Ambulatory Visit
Admission: RE | Admit: 2017-07-07 | Discharge: 2017-07-07 | Disposition: A | Discharge status: Home / Self Care | Source: Ambulatory Visit | Attending: Radiation Oncology | Admitting: Radiation Oncology

## 2017-07-07 ENCOUNTER — Telehealth: Payer: Self-pay | Admitting: Internal Medicine

## 2017-07-07 DIAGNOSIS — Z510 Encounter for antineoplastic radiation therapy: Secondary | ICD-10-CM | POA: Diagnosis not present

## 2017-07-07 NOTE — Telephone Encounter (Signed)
Cancelled appt per MM - f/u not needed on 5/24 - pt is aware .

## 2017-07-08 ENCOUNTER — Ambulatory Visit
Admission: RE | Admit: 2017-07-08 | Discharge: 2017-07-08 | Disposition: A | Discharge status: Home / Self Care | Source: Ambulatory Visit | Attending: Radiation Oncology | Admitting: Radiation Oncology

## 2017-07-08 DIAGNOSIS — Z510 Encounter for antineoplastic radiation therapy: Secondary | ICD-10-CM | POA: Diagnosis not present

## 2017-07-08 DIAGNOSIS — C3411 Malignant neoplasm of upper lobe, right bronchus or lung: Secondary | ICD-10-CM

## 2017-07-09 ENCOUNTER — Ambulatory Visit
Admission: RE | Admit: 2017-07-09 | Discharge: 2017-07-09 | Disposition: A | Discharge status: Home / Self Care | Source: Ambulatory Visit | Attending: Radiation Oncology | Admitting: Radiation Oncology

## 2017-07-09 DIAGNOSIS — Z510 Encounter for antineoplastic radiation therapy: Secondary | ICD-10-CM | POA: Diagnosis not present

## 2017-07-09 NOTE — Progress Notes (Signed)
  Radiation Oncology         [phone] ________________________________  Name: ALEENE SWANNER MRN: [id number]  Date: 07/08/2017  DOB: 01/22/1956  SIMULATION AND TREATMENT PLANNING NOTE  DIAGNOSIS:     ICD-10-CM   1. Malignant neoplasm of bronchus of right upper lobe (HCC) C34.11      Site:  Right lung  NARRATIVE: The patient has experienced significant shrinkage of the tumor with a significant resultant change in the patient's anatomy in the treatment area.  The patient therefore was brought once again to simulation to undergo a re-simulation for planning of the patient's boost treatment with current anatomic information.  The patient was brought to the [facility].  Identity was confirmed.  All relevant records and images related to the planned course of therapy were reviewed.   Written consent to proceed with treatment was confirmed which was freely given after reviewing the details related to the planned course of therapy had been reviewed with the patient.  Then, the patient was set-up in a stable reproducible  supine position for radiation therapy.  CT images were obtained.  Surface markings were placed.    Medically necessary complex treatment device(s) for immobilization:  Vac-lock bag.   The CT images were loaded into the planning software.  Then the target and avoidance structures were contoured.  Treatment planning then occurred.  The radiation prescription was entered and confirmed.  A total of 4 complex treatment devices were fabricated which relate to the designed radiation treatment fields. Each of these customized fields/ complex treatment devices will be used on a daily basis during the radiation course. I have requested : Isodose Plan.   PLAN:  The patient will receive an additional 6 Gy in 3 fractions to yield a total dose of 66 Gy.  ________________________________   Jodelle Gross, MD, PhD

## 2017-07-10 ENCOUNTER — Other Ambulatory Visit

## 2017-07-10 ENCOUNTER — Ambulatory Visit
Admission: RE | Admit: 2017-07-10 | Discharge: 2017-07-10 | Disposition: A | Discharge status: Home / Self Care | Source: Ambulatory Visit | Attending: Radiation Oncology | Admitting: Radiation Oncology

## 2017-07-10 ENCOUNTER — Ambulatory Visit: Admitting: Oncology

## 2017-07-10 DIAGNOSIS — Z510 Encounter for antineoplastic radiation therapy: Secondary | ICD-10-CM | POA: Diagnosis not present

## 2017-07-14 ENCOUNTER — Ambulatory Visit: Admitting: Nurse Practitioner

## 2017-07-14 ENCOUNTER — Ambulatory Visit
Admission: RE | Admit: 2017-07-14 | Discharge: 2017-07-14 | Disposition: A | Discharge status: Home / Self Care | Source: Ambulatory Visit | Attending: Radiation Oncology | Admitting: Radiation Oncology

## 2017-07-14 ENCOUNTER — Other Ambulatory Visit

## 2017-07-14 ENCOUNTER — Inpatient Hospital Stay (HOSPITAL_BASED_OUTPATIENT_CLINIC_OR_DEPARTMENT_OTHER): Admitting: Medical

## 2017-07-14 ENCOUNTER — Other Ambulatory Visit: Payer: Self-pay | Admitting: Medical

## 2017-07-14 ENCOUNTER — Inpatient Hospital Stay

## 2017-07-14 ENCOUNTER — Ambulatory Visit

## 2017-07-14 VITALS — BP 113/58 | HR 79 | Temp 98.3°F | Resp 16

## 2017-07-14 DIAGNOSIS — C3411 Malignant neoplasm of upper lobe, right bronchus or lung: Secondary | ICD-10-CM

## 2017-07-14 DIAGNOSIS — Z87891 Personal history of nicotine dependence: Secondary | ICD-10-CM | POA: Diagnosis not present

## 2017-07-14 DIAGNOSIS — Z510 Encounter for antineoplastic radiation therapy: Secondary | ICD-10-CM | POA: Diagnosis not present

## 2017-07-14 DIAGNOSIS — M7989 Other specified soft tissue disorders: Secondary | ICD-10-CM | POA: Diagnosis not present

## 2017-07-14 DIAGNOSIS — B372 Candidiasis of skin and nail: Secondary | ICD-10-CM

## 2017-07-14 DIAGNOSIS — I10 Essential (primary) hypertension: Secondary | ICD-10-CM | POA: Diagnosis not present

## 2017-07-14 DIAGNOSIS — C3490 Malignant neoplasm of unspecified part of unspecified bronchus or lung: Secondary | ICD-10-CM

## 2017-07-14 DIAGNOSIS — Z5111 Encounter for antineoplastic chemotherapy: Secondary | ICD-10-CM | POA: Diagnosis not present

## 2017-07-14 LAB — CBC WITH DIFFERENTIAL (CANCER CENTER ONLY)
Basophils Absolute: 0 10*3/uL (ref 0.0–0.1)
Basophils Relative: 1 %
Eosinophils Absolute: 0 10*3/uL (ref 0.0–0.5)
Eosinophils Relative: 1 %
HCT: 30.4 % — ABNORMAL LOW (ref 34.8–46.6)
Hemoglobin: 9.5 g/dL — ABNORMAL LOW (ref 11.6–15.9)
LYMPHS ABS: 0.5 10*3/uL — AB (ref 0.9–3.3)
LYMPHS PCT: 14 %
MCH: 27.1 pg (ref 25.1–34.0)
MCHC: 31.3 g/dL — AB (ref 31.5–36.0)
MCV: 86.9 fL (ref 79.5–101.0)
MONO ABS: 0.5 10*3/uL (ref 0.1–0.9)
MONOS PCT: 13 %
Neutro Abs: 2.8 10*3/uL (ref 1.5–6.5)
Neutrophils Relative %: 71 %
PLATELETS: 283 10*3/uL (ref 145–400)
RBC: 3.5 MIL/uL — ABNORMAL LOW (ref 3.70–5.45)
RDW: 19.7 % — AB (ref 11.2–14.5)
WBC Count: 3.8 10*3/uL — ABNORMAL LOW (ref 3.9–10.3)

## 2017-07-14 LAB — COMPREHENSIVE METABOLIC PANEL
ALBUMIN: 3.3 g/dL — AB (ref 3.5–5.0)
ALT: 9 U/L — ABNORMAL LOW (ref 14–54)
ANION GAP: 11 (ref 5–15)
AST: 12 U/L — AB (ref 15–41)
Alkaline Phosphatase: 128 U/L — ABNORMAL HIGH (ref 38–126)
BILIRUBIN TOTAL: 0.2 mg/dL — AB (ref 0.3–1.2)
BUN: 11 mg/dL (ref 6–20)
CALCIUM: 8.9 mg/dL (ref 8.9–10.3)
CO2: 24 mmol/L (ref 22–32)
CREATININE: 0.58 mg/dL (ref 0.44–1.00)
Chloride: 108 mmol/L (ref 101–111)
GFR calc Af Amer: >60 mL/min (ref >60)
GFR calc non Af Amer: >60 mL/min (ref >60)
GLUCOSE: 99 mg/dL (ref 65–99)
Potassium: 4.5 mmol/L (ref 3.5–5.1)
Sodium: 143 mmol/L (ref 135–145)
TOTAL PROTEIN: 7.3 g/dL (ref 6.5–8.1)

## 2017-07-14 MED ORDER — SODIUM CHLORIDE 0.9 % IV SOLN
Freq: Once | INTRAVENOUS | Status: AC
  Administered 2017-07-14: 12:00:00 via INTRAVENOUS

## 2017-07-14 MED ORDER — DIPHENHYDRAMINE HCL 50 MG/ML IJ SOLN
INTRAMUSCULAR | Status: AC
  Filled 2017-07-14: qty 1

## 2017-07-14 MED ORDER — FAMOTIDINE IN NACL 20-0.9 MG/50ML-% IV SOLN
20.0000 mg | Freq: Once | INTRAVENOUS | Status: AC
  Administered 2017-07-14: 20 mg via INTRAVENOUS

## 2017-07-14 MED ORDER — DIPHENHYDRAMINE HCL 50 MG/ML IJ SOLN
50.0000 mg | Freq: Once | INTRAMUSCULAR | Status: AC
  Administered 2017-07-14: 50 mg via INTRAVENOUS

## 2017-07-14 MED ORDER — PALONOSETRON HCL INJECTION 0.25 MG/5ML
INTRAVENOUS | Status: AC
  Filled 2017-07-14: qty 5

## 2017-07-14 MED ORDER — SODIUM CHLORIDE 0.9 % IV SOLN
45.0000 mg/m2 | Freq: Once | INTRAVENOUS | Status: AC
  Administered 2017-07-14: 84 mg via INTRAVENOUS
  Filled 2017-07-14: qty 14

## 2017-07-14 MED ORDER — FAMOTIDINE IN NACL 20-0.9 MG/50ML-% IV SOLN
INTRAVENOUS | Status: AC
  Filled 2017-07-14: qty 50

## 2017-07-14 MED ORDER — PALONOSETRON HCL INJECTION 0.25 MG/5ML
0.2500 mg | Freq: Once | INTRAVENOUS | Status: AC
  Administered 2017-07-14: 0.25 mg via INTRAVENOUS

## 2017-07-14 MED ORDER — CLOTRIMAZOLE-BETAMETHASONE 1-0.05 % EX CREA: 1.0000 "application " | TOPICAL_CREAM | Freq: Two times a day (BID) | CUTANEOUS | 1 refills | Status: DC

## 2017-07-14 MED ORDER — SODIUM CHLORIDE 0.9 % IV SOLN
20.0000 mg | Freq: Once | INTRAVENOUS | Status: AC
  Administered 2017-07-14: 20 mg via INTRAVENOUS
  Filled 2017-07-14: qty 2

## 2017-07-14 MED ORDER — SODIUM CHLORIDE 0.9 % IV SOLN
210.8000 mg | Freq: Once | INTRAVENOUS | Status: AC
  Administered 2017-07-14: 210 mg via INTRAVENOUS
  Filled 2017-07-14: qty 21

## 2017-07-14 MED FILL — CLOTRIMAZOLE-BETAMETHASONE: 1-0.05 | 20 days supply | Qty: 45 | Fill #0

## 2017-07-14 NOTE — Patient Instructions (Signed)
   [facility] Discharge Instructions for Patients Receiving Chemotherapy  Today you received the following chemotherapy agents Taxol and Carboplatin   To help prevent nausea and vomiting after your treatment, we encourage you to take your nausea medication as directed.    If you develop nausea and vomiting that is not controlled by your nausea medication, call the clinic.   BELOW ARE SYMPTOMS THAT SHOULD BE REPORTED IMMEDIATELY:  *FEVER GREATER THAN 100.5 F  *CHILLS WITH OR WITHOUT FEVER  NAUSEA AND VOMITING THAT IS NOT CONTROLLED WITH YOUR NAUSEA MEDICATION  *UNUSUAL SHORTNESS OF BREATH  *UNUSUAL BRUISING OR BLEEDING  TENDERNESS IN MOUTH AND THROAT WITH OR WITHOUT PRESENCE OF ULCERS  *URINARY PROBLEMS  *BOWEL PROBLEMS  UNUSUAL RASH Items with * indicate a potential emergency and should be followed up as soon as possible.  Feel free to call the clinic should you have any questions or concerns. The clinic phone number is [phone].  Please show the CHEMO ALERT CARD at check-in to the Emergency Department and triage nurse.   

## 2017-07-15 ENCOUNTER — Ambulatory Visit
Admission: RE | Admit: 2017-07-15 | Discharge: 2017-07-15 | Disposition: A | Discharge status: Home / Self Care | Source: Ambulatory Visit | Attending: Radiation Oncology | Admitting: Radiation Oncology

## 2017-07-15 DIAGNOSIS — Z510 Encounter for antineoplastic radiation therapy: Secondary | ICD-10-CM | POA: Diagnosis not present

## 2017-07-15 NOTE — Progress Notes (Signed)
[facility] Progress Note   Ashley Wilson [id number] [birth year] 62 y.o.  Ashley Wilson is managed by Dr. Fanny Bien. Ashley Wilson  Actively treated with chemotherapy: yes  Current Therapy: Carboplatin and paclitaxel  Last Treated: 07/14/2017 (cycle 7, day 1)  Assessment: Plan:    Cutaneous candidiasis   Cutaneous candidiasis: The patient was given a prescription for Lotrisone cream with directions to use twice daily until improved.  Please see After Visit Summary for patient specific instructions.  Future Appointments  Date Time Provider [place]  07/16/2017  8:15 AM [facility] LINAC 1 [facility] None  07/17/2017  8:15 AM [facility] LINAC 1 [facility] None  07/23/2017 10:30 AM [facility] LAB 2 [facility] None  07/23/2017 11:00 AM Curt Bears, MD [facility] None    No orders of the defined types were placed in this encounter.      Subjective:   Patient ID:  Ashley Wilson is a 62 y.o. (DOB Apr 11, 1955) female.  Chief Complaint: No chief complaint on file.   HPI Ashley Wilson is a 62 year old female with a stage IIIb (T3, N2, M0) non-small cell lung cancer, adenocarcinoma diagnosed in March 2019.  She is receiving cycle 7, day 1 of carboplatin and paclitaxel today.  She requests to be seen while in infusion.  She reports that she has an itching and erythematous rash in the skin folds of her right and left flank.  Medications: I have reviewed the patient's current medications.  Allergies: No Known Allergies  Past Medical History:  Diagnosis Date  . Compression fracture of lumbar vertebra ([place])   . Hypertension     Past Surgical History:  Procedure Laterality Date  . BACK SURGERY  2009   ruptured disc    Family History  Problem Relation Age of Onset  . Hypertension Mother   . CAD Mother   . Heart attack Mother   . Diabetes Father   . CAD Father   . Bone cancer Brother     Social History   Socioeconomic History  . Marital  status: Widowed    Spouse name: Not on file  . Number of children: Not on file  . Years of education: Not on file  . Highest education level: Not on file  Occupational History  . Not on file  Social Needs  . Financial resource strain: Not on file  . Food insecurity:    Worry: Not on file    Inability: Not on file  . Transportation needs:    Medical: Not on file    Non-medical: Not on file  Tobacco Use  . Smoking status: Former Smoker    Years: 35.00    Types: Cigarettes    Start date: 1978    Last attempt to quit: 04/28/2017    Years since quitting: 0.2  . Smokeless tobacco: Never Used  Substance and Sexual Activity  . Alcohol use: Yes    Alcohol/week: 1.2 - 1.8 oz    Types: 2 - 3 Cans of beer per week    Comment: occasionally   . Drug use: Never  . Sexual activity: Not on file  Lifestyle  . Physical activity:    Days per week: Not on file    Minutes per session: Not on file  . Stress: Not on file  Relationships  . Social connections:    Talks on phone: Not on file    Gets together: Not on file    Attends religious service: Not on file  Active member of club or organization: Not on file    Attends meetings of clubs or organizations: Not on file    Relationship status: Not on file  . Intimate partner violence:    Fear of current or ex partner: Not on file    Emotionally abused: Not on file    Physically abused: Not on file    Forced sexual activity: Not on file  Other Topics Concern  . Not on file  Social History Narrative  . Not on file    Past Medical History, Surgical history, Social history, and Family history were reviewed and updated as appropriate.   Please see review of systems for further details on the patient's review from today.   Review of Systems:  Review of Systems  Skin: Positive for rash.       Pruritic and erythematous rash in the right and left flank skin folds.    Objective:   Physical Exam:  There were no vitals taken for this  visit. ECOG: 0  Physical Exam  Constitutional: No distress.  HENT:  Head: Normocephalic and atraumatic.  Skin: Skin is warm and dry. Rash noted. She is not diaphoretic. There is erythema.  Erythematous confluent rash in the skin folds of the right and left flank with multiple small satellite lesions.    Lab Review:     Component Value Date/Time   NA 143 07/14/2017 0857   K 4.5 07/14/2017 0857   CL 108 07/14/2017 0857   CO2 24 07/14/2017 0857   GLUCOSE 99 07/14/2017 0857   BUN 11 07/14/2017 0857   CREATININE 0.58 07/14/2017 0857   CREATININE 0.75 07/06/2017 0813   CALCIUM 8.9 07/14/2017 0857   PROT 7.3 07/14/2017 0857   ALBUMIN 3.3 (L) 07/14/2017 0857   AST 12 (L) 07/14/2017 0857   AST 9 07/06/2017 0813   ALT 9 (L) 07/14/2017 0857   ALT 11 07/06/2017 0813   ALKPHOS 128 (H) 07/14/2017 0857   BILITOT 0.2 (L) 07/14/2017 0857   BILITOT 0.2 07/06/2017 0813   GFRNONAA >60 07/14/2017 0857   GFRNONAA >60 07/06/2017 0813   GFRAA >60 07/14/2017 0857   GFRAA >60 07/06/2017 0813       Component Value Date/Time   WBC 3.8 (L) 07/14/2017 0857   WBC 11.6 (H) 04/28/2017 0918   RBC 3.50 (L) 07/14/2017 0857   HGB 9.5 (L) 07/14/2017 0857   HCT 30.4 (L) 07/14/2017 0857   PLT 283 07/14/2017 0857   MCV 86.9 07/14/2017 0857   MCH 27.1 07/14/2017 0857   MCHC 31.3 (L) 07/14/2017 0857   RDW 19.7 (H) 07/14/2017 0857   LYMPHSABS 0.5 (L) 07/14/2017 0857   MONOABS 0.5 07/14/2017 0857   EOSABS 0.0 07/14/2017 0857   BASOSABS 0.0 07/14/2017 0857   -------------------------------  Imaging from last 24 hours (if applicable):  Radiology interpretation: No results found.      This case was discussed with Dr. Julien Nordmann. He expressed agreement with my management of this patient.

## 2017-07-16 ENCOUNTER — Ambulatory Visit
Admission: RE | Admit: 2017-07-16 | Discharge: 2017-07-16 | Disposition: A | Discharge status: Home / Self Care | Source: Ambulatory Visit | Attending: Radiation Oncology | Admitting: Radiation Oncology

## 2017-07-16 DIAGNOSIS — Z510 Encounter for antineoplastic radiation therapy: Secondary | ICD-10-CM | POA: Diagnosis not present

## 2017-07-17 ENCOUNTER — Ambulatory Visit
Admission: RE | Admit: 2017-07-17 | Discharge: 2017-07-17 | Disposition: A | Discharge status: Home / Self Care | Source: Ambulatory Visit | Attending: Radiation Oncology | Admitting: Radiation Oncology

## 2017-07-17 ENCOUNTER — Telehealth: Payer: Self-pay

## 2017-07-17 DIAGNOSIS — Z510 Encounter for antineoplastic radiation therapy: Secondary | ICD-10-CM | POA: Diagnosis not present

## 2017-07-17 NOTE — Telephone Encounter (Signed)
Patient stop by for verification of her scheduled appointments. Per 5/31 walkin

## 2017-07-21 ENCOUNTER — Encounter: Payer: Self-pay | Admitting: Radiation Oncology

## 2017-07-21 NOTE — Progress Notes (Signed)
  Radiation Oncology         [phone] ________________________________  Name: Ashley Wilson MRN: [id number]  Date: 07/21/2017  DOB: [birth year]  End of Treatment Note  Diagnosis:   Stage IIIB, T3N2M0 NSCLC, adenocarcinoma of the RUL     Indication for treatment:  Curative       Radiation treatment dates:   06/02/17 - 07/17/17  Site/dose:   Right lung treated to 60 Gy with 30 fx of 2 Gy followed by a boost of 6 Gy with 3 fx of 2 Gy  Beams/energy:   3D / 6X, 10X  Narrative: The patient tolerated radiation treatment relatively well.     Plan: The patient has completed radiation treatment. The patient will return to radiation oncology clinic for routine followup in one month. I advised them to call or return sooner if they have any questions or concerns related to their recovery or treatment.  ------------------------------------------------  Jodelle Gross, MD, PhD   This document serves as a record of services personally performed by Kyung Rudd, MD. It was created on his behalf by Linward Natal, a trained medical scribe. The creation of this record is based on the scribe's personal observations and the provider's statements to them. This document has been checked and approved by the attending provider.

## 2017-07-23 ENCOUNTER — Inpatient Hospital Stay: Attending: Internal Medicine | Admitting: Internal Medicine

## 2017-07-23 ENCOUNTER — Inpatient Hospital Stay

## 2017-07-23 ENCOUNTER — Encounter: Payer: Self-pay | Admitting: Internal Medicine

## 2017-07-23 ENCOUNTER — Telehealth: Payer: Self-pay | Admitting: Internal Medicine

## 2017-07-23 ENCOUNTER — Encounter: Payer: Self-pay | Admitting: *Deleted

## 2017-07-23 DIAGNOSIS — M1990 Unspecified osteoarthritis, unspecified site: Secondary | ICD-10-CM | POA: Diagnosis not present

## 2017-07-23 DIAGNOSIS — R2232 Localized swelling, mass and lump, left upper limb: Secondary | ICD-10-CM | POA: Diagnosis not present

## 2017-07-23 DIAGNOSIS — Z923 Personal history of irradiation: Secondary | ICD-10-CM | POA: Insufficient documentation

## 2017-07-23 DIAGNOSIS — I10 Essential (primary) hypertension: Secondary | ICD-10-CM | POA: Diagnosis not present

## 2017-07-23 DIAGNOSIS — C3411 Malignant neoplasm of upper lobe, right bronchus or lung: Secondary | ICD-10-CM | POA: Insufficient documentation

## 2017-07-23 DIAGNOSIS — L299 Pruritus, unspecified: Secondary | ICD-10-CM | POA: Diagnosis not present

## 2017-07-23 DIAGNOSIS — R590 Localized enlarged lymph nodes: Secondary | ICD-10-CM | POA: Insufficient documentation

## 2017-07-23 DIAGNOSIS — Z9221 Personal history of antineoplastic chemotherapy: Secondary | ICD-10-CM | POA: Diagnosis not present

## 2017-07-23 DIAGNOSIS — C3490 Malignant neoplasm of unspecified part of unspecified bronchus or lung: Secondary | ICD-10-CM

## 2017-07-23 DIAGNOSIS — Z79899 Other long term (current) drug therapy: Secondary | ICD-10-CM | POA: Diagnosis not present

## 2017-07-23 DIAGNOSIS — R252 Cramp and spasm: Secondary | ICD-10-CM | POA: Diagnosis not present

## 2017-07-23 DIAGNOSIS — R5383 Other fatigue: Secondary | ICD-10-CM | POA: Insufficient documentation

## 2017-07-23 LAB — CBC WITH DIFFERENTIAL (CANCER CENTER ONLY)
Basophils Absolute: 0 10*3/uL (ref 0.0–0.1)
Basophils Relative: 1 %
Eosinophils Absolute: 0 10*3/uL (ref 0.0–0.5)
Eosinophils Relative: 1 %
HEMATOCRIT: 30 % — AB (ref 34.8–46.6)
HEMOGLOBIN: 9.5 g/dL — AB (ref 11.6–15.9)
LYMPHS ABS: 0.5 10*3/uL — AB (ref 0.9–3.3)
LYMPHS PCT: 14 %
MCH: 27.8 pg (ref 25.1–34.0)
MCHC: 31.7 g/dL (ref 31.5–36.0)
MCV: 87.7 fL (ref 79.5–101.0)
MONO ABS: 0.5 10*3/uL (ref 0.1–0.9)
MONOS PCT: 12 %
NEUTROS ABS: 2.6 10*3/uL (ref 1.5–6.5)
NEUTROS PCT: 72 %
Platelet Count: 229 10*3/uL (ref 145–400)
RBC: 3.42 MIL/uL — ABNORMAL LOW (ref 3.70–5.45)
RDW: 20.9 % — AB (ref 11.2–14.5)
WBC Count: 3.7 10*3/uL — ABNORMAL LOW (ref 3.9–10.3)

## 2017-07-23 LAB — CMP (CANCER CENTER ONLY)
ALT: <6 U/L (ref 0–55)
AST: 9 U/L (ref 5–34)
Albumin: 3 g/dL — ABNORMAL LOW (ref 3.5–5.0)
Alkaline Phosphatase: 157 U/L — ABNORMAL HIGH (ref 40–150)
Anion gap: 10 (ref 3–11)
BUN: 10 mg/dL (ref 7–26)
CO2: 25 mmol/L (ref 22–29)
Calcium: 8.9 mg/dL (ref 8.4–10.4)
Chloride: 109 mmol/L (ref 98–109)
Creatinine: 0.82 mg/dL (ref 0.60–1.10)
GFR, Est AFR Am: >60 mL/min (ref >60)
GFR, Estimated: >60 mL/min (ref >60)
Glucose, Bld: 106 mg/dL (ref 70–140)
Potassium: 3.5 mmol/L (ref 3.5–5.1)
Sodium: 144 mmol/L (ref 136–145)
Total Bilirubin: 0.2 mg/dL (ref 0.2–1.2)
Total Protein: 7.3 g/dL (ref 6.4–8.3)

## 2017-07-23 NOTE — Telephone Encounter (Signed)
Scheduled appt per 6/6 los - gave patient aVS and calender per los.

## 2017-07-23 NOTE — Progress Notes (Signed)
[facility] [phone]   [phone]  OFFICE PROGRESS NOTE  [name], MD [address] [place] [address]  DIAGNOSIS: stageIIIB (T3, N2, M0)non-small cell lung cancer, adenocarcinoma diagnosed in March 2019 and presented with large right upper lobe lung mass in addition to right hilar and mediastinal lymphadenopathy.  PD-L1 0%  PRIOR THERAPY: Concurrent chemoradiation with weekly carboplatin for an AUC of 2 and paclitaxel 45 mg/m2.  First dose expected on 06/01/2017.  Status post 7 cycles.  Last dose was giving 07/14/2017.   CURRENT THERAPY:  None.   INTERVAL HISTORY: Ashley Wilson 62 y.o. female returns to the clinic today for follow-up visit accompanied by her daughter.  The patient is feeling fine today with no specific complaints.  She tolerated the previous course of concurrent chemoradiation fairly well.  She denied having any current chest pain, shortness of breath, cough or hemoptysis.  She denied having any fever or chills.  She has no nausea, vomiting, diarrhea or constipation.  She has no headache or visual changes.  She continues to have swelling and spasm and the left hand fingers.  She is here today for evaluation at the end of her concurrent chemoradiation.  MEDICAL HISTORY: Past Medical History:  Diagnosis Date  . Compression fracture of lumbar vertebra ([place])   . Hypertension     ALLERGIES:  has No Known Allergies.  MEDICATIONS:  Current Outpatient Medications  Medication Sig Dispense Refill  . acetaminophen (TYLENOL) 500 MG tablet Take 1,000 mg by mouth every 6 (six) hours as needed for mild pain or moderate pain.    Marland Kitchen amLODipine (NORVASC) 10 MG tablet Take 10 mg by mouth daily.    . cetirizine (ZYRTEC) 10 MG tablet Take 10 mg by mouth daily as needed for allergies.    . clotrimazole-betamethasone (LOTRISONE) cream Apply 1 application topically 2 (two) times daily. 45 g 1  . loperamide (IMODIUM) 2 MG capsule  Take 4 mg by mouth as needed for diarrhea or loose stools.    . prochlorperazine (COMPAZINE) 10 MG tablet Take 1 tablet (10 mg total) by mouth every 6 (six) hours as needed for nausea or vomiting. (Patient not taking: Reported on 06/08/2017) 30 tablet 0  . spironolactone (ALDACTONE) 25 MG tablet Take 25 mg by mouth daily.    . sucralfate (CARAFATE) 1 GM/10ML suspension Take 10 mLs (1 g total) by mouth 4 (four) times daily -  with meals and at bedtime. (Patient not taking: Reported on 07/06/2017) 420 mL 0   No current facility-administered medications for this visit.     SURGICAL HISTORY:  Past Surgical History:  Procedure Laterality Date  . BACK SURGERY  2009   ruptured disc    REVIEW OF SYSTEMS:  Constitutional: positive for fatigue Eyes: negative Ears, nose, mouth, throat, and face: negative Respiratory: negative Cardiovascular: negative Gastrointestinal: negative Genitourinary:negative Integument/breast: negative Hematologic/lymphatic: negative Musculoskeletal:positive for arthralgias Neurological: negative Behavioral/Psych: negative Endocrine: negative Allergic/Immunologic: negative   PHYSICAL EXAMINATION: General appearance: alert, cooperative, fatigued and no distress Head: Normocephalic, without obvious abnormality, atraumatic Neck: no adenopathy, no JVD, supple, symmetrical, trachea midline and thyroid not enlarged, symmetric, no tenderness/mass/nodules Lymph nodes: Cervical, supraclavicular, and axillary nodes normal. Resp: clear to auscultation bilaterally Back: symmetric, no curvature. ROM normal. No CVA tenderness. Cardio: regular rate and rhythm, S1, S2 normal, no murmur, click, rub or gallop GI: soft, non-tender; bowel sounds normal; no masses,  no organomegaly Extremities: extremities normal, atraumatic, no  cyanosis or edema Neurologic: Alert and oriented X 3, normal strength and tone. Normal symmetric reflexes. Normal coordination and gait  ECOG PERFORMANCE  STATUS: 1 - Symptomatic but completely ambulatory  Blood pressure 124/70, pulse 78, temperature 98.3 F (36.8 C), temperature source Oral, resp. rate 18, height 5' 3"  (1.6 m), weight 160 lb 3.2 oz (72.7 kg), SpO2 100 %.  LABORATORY DATA: Lab Results  Component Value Date   WBC 3.7 (L) 07/23/2017   HGB 9.5 (L) 07/23/2017   HCT 30.0 (L) 07/23/2017   MCV 87.7 07/23/2017   PLT 229 07/23/2017      Chemistry      Component Value Date/Time   NA 144 07/23/2017 1023   K 3.5 07/23/2017 1023   CL 109 07/23/2017 1023   CO2 25 07/23/2017 1023   BUN 10 07/23/2017 1023   CREATININE 0.82 07/23/2017 1023      Component Value Date/Time   CALCIUM 8.9 07/23/2017 1023   ALKPHOS 157 (H) 07/23/2017 1023   AST 9 07/23/2017 1023   ALT <6 07/23/2017 1023   BILITOT 0.2 07/23/2017 1023       RADIOGRAPHIC STUDIES: No results found.  ASSESSMENT AND PLAN: This is a very pleasant 62 years old [name] with stage IIIB non-small cell lung cancer, adenocarcinoma.   The patient underwent a course of concurrent chemoradiation with weekly carboplatin and paclitaxel status post 7 cycles.  She tolerated this course of treatment fairly well with no concerning complaints.  She continues to have arthralgia as well as the swelling and spasm and the left fingers. I had a lengthy discussion with the patient and her daughter today about her condition and further investigation and treatment options.  I will arrange for the patient to have repeat CT scan of the chest in 3 weeks for restaging of her disease.  For some reason the patient was under the impression that this course of chemoradiation will cure her cancer and she will not need any further treatment.  I explained to the patient that this is possible but unlikely.  She is also interested in surgical evaluation after her scan. It was a lengthy discussion with the patient again about her current condition and review of her previous imaging studies and  treatment options. I will see the patient back for follow-up visit in around 3 weeks for evaluation and discussion of the scan results and more detailed discussion of her treatment options based on the restaging scan. For the spasm and arthralgia of the left fingers, I will refer the patient to hand surgeon for evaluation and recommendation. She was advised to call immediately if she has any concerning symptoms in the interval. The patient voices understanding of current disease status and treatment options and is in agreement with the current care plan.  All questions were answered. The patient knows to call the clinic with any problems, questions or concerns. We can certainly see the patient much sooner if necessary.  I spent 20 minutes counseling the patient face to face. The total time spent in the appointment was 25 minutes.  Disclaimer: This note was dictated with voice recognition software. Similar sounding words can inadvertently be transcribed and may not be corrected upon review.

## 2017-07-29 ENCOUNTER — Encounter (INDEPENDENT_AMBULATORY_CARE_PROVIDER_SITE_OTHER): Payer: Self-pay | Admitting: Orthopaedic Surgery

## 2017-07-29 ENCOUNTER — Ambulatory Visit (INDEPENDENT_AMBULATORY_CARE_PROVIDER_SITE_OTHER): Admitting: Orthopaedic Surgery

## 2017-07-29 ENCOUNTER — Ambulatory Visit (INDEPENDENT_AMBULATORY_CARE_PROVIDER_SITE_OTHER)

## 2017-07-29 DIAGNOSIS — M79642 Pain in left hand: Secondary | ICD-10-CM

## 2017-07-29 NOTE — Progress Notes (Signed)
Office Visit Note   Patient: Ashley Wilson           Date of Birth: Jun 26, 1955           MRN: [id number] Visit Date: 07/29/2017              Requested by: Curt Bears, MD [address] [place], [place] [address] PCP: [name], MD   Assessment & Plan: Visit Diagnoses:  1. Pain of left hand     Plan: Impression is rheumatoid arthritis.  At this point, we will refer the patient back to [facility], [place] with rheumatology.  She will follow up with [place] as needed.  Call with concerns or questions in the meantime.  Follow-Up Instructions: Return if symptoms worsen or fail to improve.   Orders:  Orders Placed This Encounter  Procedures  . XR Hand Complete Left  . Ambulatory referral to Rheumatology   No orders of the defined types were placed in this encounter.     Procedures: No procedures performed   Clinical Data: No additional findings.   Subjective: Chief Complaint  Patient presents with  . Left Hand - Pain    HPI patient is a pleasant right-hand-dominant 62 year old female who presents to our clinic today with left hand pain.  This began in March of this year.  No known injury or change in activity.   she initially just had pain which was relieved with strong anti-inflammatories provided by her primary care provider but then she started to notice significant swelling.  At some point, she was referred to a rheumatologist.  During the course of her work-up, she was diagnosed with lung cancer and started to begin treatment for that.  She never followed back up with her rheumatologist due to the other ongoing issues.  In regards to the left hand, she describes her pain is a constant ache worse with movement.  She has been taking 2 Tylenols every 6 hours around-the-clock for this.  No numbness, tingling or burning.  No hot or cold sensations.  Review of Systems as detailed in HPI.  All others reviewed and are negative.   Objective: Vital Signs: There were no  vitals taken for this visit.  Physical Exam well-developed and well-nourished female in no acute distress.  Alert and oriented x3.  Ortho Exam examination of the left hand reveals moderate swelling throughout.  No tenderness.  Slightly limited motion secondary to swelling.  She is neurovascularly intact distally.  Specialty Comments:  No specialty comments available.  Imaging: Xr Hand Complete Left  Result Date: 07/29/2017 X-rays of the right hand show erosive changes at the PIP joints.    PMFS History: Patient Active Problem List   Diagnosis Date Noted  . Pain of left hand 07/29/2017  . Malignant neoplasm of bronchus of upper lobe ([place]) 05/22/2017  . Goals of care, counseling/discussion 05/22/2017  . Encounter for antineoplastic chemotherapy 05/22/2017   Past Medical History:  Diagnosis Date  . Compression fracture of lumbar vertebra ([place])   . Hypertension     Family History  Problem Relation Age of Onset  . Hypertension Mother   . CAD Mother   . Heart attack Mother   . Diabetes Father   . CAD Father   . Bone cancer Brother     Past Surgical History:  Procedure Laterality Date  . BACK SURGERY  2009   ruptured disc   Social History   Occupational History  . Not on file  Tobacco  Use  . Smoking status: Former Smoker    Years: 35.00    Types: Cigarettes    Start date: 1978    Last attempt to quit: 04/28/2017    Years since quitting: 0.2  . Smokeless tobacco: Never Used  Substance and Sexual Activity  . Alcohol use: Yes    Alcohol/week: 1.2 - 1.8 oz    Types: 2 - 3 Cans of beer per week    Comment: occasionally   . Drug use: Never  . Sexual activity: Not on file

## 2017-08-05 ENCOUNTER — Ambulatory Visit (HOSPITAL_COMMUNITY)
Admission: RE | Admit: 2017-08-05 | Discharge: 2017-08-05 | Disposition: A | Discharge status: Home / Self Care | Source: Ambulatory Visit | Attending: Internal Medicine | Admitting: Internal Medicine

## 2017-08-05 ENCOUNTER — Encounter (HOSPITAL_COMMUNITY): Payer: Self-pay

## 2017-08-05 DIAGNOSIS — C3490 Malignant neoplasm of unspecified part of unspecified bronchus or lung: Secondary | ICD-10-CM | POA: Insufficient documentation

## 2017-08-05 DIAGNOSIS — R590 Localized enlarged lymph nodes: Secondary | ICD-10-CM | POA: Insufficient documentation

## 2017-08-05 MED ORDER — IOHEXOL 300 MG/ML  SOLN
75.0000 mL | Freq: Once | INTRAMUSCULAR | Status: AC | PRN
  Administered 2017-08-05: 75 mL via INTRAVENOUS

## 2017-08-10 ENCOUNTER — Inpatient Hospital Stay

## 2017-08-10 DIAGNOSIS — C3411 Malignant neoplasm of upper lobe, right bronchus or lung: Secondary | ICD-10-CM | POA: Diagnosis not present

## 2017-08-10 DIAGNOSIS — C3490 Malignant neoplasm of unspecified part of unspecified bronchus or lung: Secondary | ICD-10-CM

## 2017-08-10 LAB — CMP (CANCER CENTER ONLY)
ALBUMIN: 3.1 g/dL — AB (ref 3.5–5.0)
ALK PHOS: 139 U/L (ref 40–150)
ALT: <6 U/L (ref 0–55)
ANION GAP: 12 — AB (ref 3–11)
AST: 9 U/L (ref 5–34)
BILIRUBIN TOTAL: 0.3 mg/dL (ref 0.2–1.2)
BUN: 11 mg/dL (ref 7–26)
CALCIUM: 9.6 mg/dL (ref 8.4–10.4)
CO2: 24 mmol/L (ref 22–29)
CREATININE: 0.78 mg/dL (ref 0.60–1.10)
Chloride: 106 mmol/L (ref 98–109)
GFR, Est AFR Am: >60 mL/min (ref >60)
GFR, Estimated: >60 mL/min (ref >60)
GLUCOSE: 103 mg/dL (ref 70–140)
Potassium: 3.8 mmol/L (ref 3.5–5.1)
Sodium: 142 mmol/L (ref 136–145)
TOTAL PROTEIN: 7.7 g/dL (ref 6.4–8.3)

## 2017-08-10 LAB — CBC WITH DIFFERENTIAL (CANCER CENTER ONLY)
Basophils Absolute: 0 10*3/uL (ref 0.0–0.1)
Basophils Relative: 0 %
EOS ABS: 0.2 10*3/uL (ref 0.0–0.5)
Eosinophils Relative: 3 %
HCT: 31.5 % — ABNORMAL LOW (ref 34.8–46.6)
HEMOGLOBIN: 9.8 g/dL — AB (ref 11.6–15.9)
LYMPHS ABS: 1.1 10*3/uL (ref 0.9–3.3)
LYMPHS PCT: 15 %
MCH: 27.8 pg (ref 25.1–34.0)
MCHC: 31.1 g/dL — ABNORMAL LOW (ref 31.5–36.0)
MCV: 89.5 fL (ref 79.5–101.0)
Monocytes Absolute: 0.7 10*3/uL (ref 0.1–0.9)
Monocytes Relative: 9 %
NEUTROS PCT: 73 %
Neutro Abs: 5.3 10*3/uL (ref 1.5–6.5)
Platelet Count: 286 10*3/uL (ref 145–400)
RBC: 3.52 MIL/uL — AB (ref 3.70–5.45)
RDW: 20.7 % — ABNORMAL HIGH (ref 11.2–14.5)
WBC: 7.4 10*3/uL (ref 3.9–10.3)

## 2017-08-11 NOTE — Progress Notes (Signed)
[place] OFFICE PROGRESS NOTE  [name], MD [address] [place] [address]  DIAGNOSIS: stageIIIB (T3, N2, M0)non-small cell lung cancer, adenocarcinoma diagnosed in March 2019 and presented with large right upper lobe lung mass in addition to right hilar and mediastinal lymphadenopathy.  PD-L10%  PRIOR THERAPY: Concurrent chemoradiation with weekly carboplatin for an AUC of 2 and paclitaxel 45 mg/m2. First dose expected on 06/01/2017.  Status post 7 cycles.  Last dose was giving 07/14/2017.  CURRENT THERAPY: None  INTERVAL HISTORY: Ashley Wilson 62 y.o. female returns for routine follow-up visit by herself.  The patient is feeling fine today and has no specific complaints except for itching to her chest.  She has not put anything on the itching.  She has no itching or rashes elsewhere.  She denies fevers and chills.  Denies chest pain, shortness of breath, cough, hemoptysis.  Denies nausea, vomiting, constipation, diarrhea.  Denies recent weight loss or night sweats.  She has ongoing intermittent left hand pain.  She uses Tylenol as needed.  She was seen by orthopedics recently and was told to follow-up with rheumatology for this issue.  She is still awaiting an appointment with rheumatology.  She had a restaging CT scan of the chest and is here to discuss the results.  MEDICAL HISTORY: Past Medical History:  Diagnosis Date  . Compression fracture of lumbar vertebra ([place])   . Hypertension     ALLERGIES:  has No Known Allergies.  MEDICATIONS:  Current Outpatient Medications  Medication Sig Dispense Refill  . acetaminophen (TYLENOL) 500 MG tablet Take 1,000 mg by mouth every 6 (six) hours as needed for mild pain or moderate pain.    Marland Kitchen amLODipine (NORVASC) 10 MG tablet Take 10 mg by mouth daily.    . cetirizine (ZYRTEC) 10 MG tablet Take 10 mg by mouth daily as needed for allergies.    . clotrimazole-betamethasone (LOTRISONE) cream Apply 1 application  topically 2 (two) times daily. (Patient not taking: Reported on 08/12/2017) 45 g 1  . loperamide (IMODIUM) 2 MG capsule Take 4 mg by mouth as needed for diarrhea or loose stools.    Marland Kitchen spironolactone (ALDACTONE) 25 MG tablet Take 25 mg by mouth daily.    . sucralfate (CARAFATE) 1 GM/10ML suspension Take 10 mLs (1 g total) by mouth 4 (four) times daily -  with meals and at bedtime. (Patient not taking: Reported on 07/06/2017) 420 mL 0   No current facility-administered medications for this visit.     SURGICAL HISTORY:  Past Surgical History:  Procedure Laterality Date  . BACK SURGERY  2009   ruptured disc    REVIEW OF SYSTEMS:   Review of Systems  Constitutional: Negative for appetite change, chills, fatigue, fever and unexpected weight change.  HENT:   Negative for mouth sores, nosebleeds, sore throat and trouble swallowing.   Eyes: Negative for eye problems and icterus.  Respiratory: Negative for cough, hemoptysis, shortness of breath and wheezing.   Cardiovascular: Negative for chest pain and leg swelling.  Gastrointestinal: Negative for abdominal pain, constipation, diarrhea, nausea and vomiting.  Genitourinary: Negative for bladder incontinence, difficulty urinating, dysuria, frequency and hematuria.   Musculoskeletal: Negative for back pain, gait problem, neck pain and neck stiffness. Positive for right hand pain. Skin: Positive for itching to her chest.  Neurological: Negative for dizziness, extremity weakness, gait problem, headaches, light-headedness and seizures.  Hematological: Negative for adenopathy. Does not bruise/bleed easily.  Psychiatric/Behavioral: Negative for confusion,  depression and sleep disturbance. The patient is not nervous/anxious.     PHYSICAL EXAMINATION:  Blood pressure (!) 144/79, pulse 76, temperature 98.3 F (36.8 C), temperature source Oral, resp. rate 18, height 5' 3"  (1.6 m), weight 163 lb 11.2 oz (74.3 kg), SpO2 100 %.  ECOG PERFORMANCE STATUS: 1 -  Symptomatic but completely ambulatory  Physical Exam  Constitutional: Oriented to person, place, and time and well-developed, well-nourished, and in no distress. No distress.  HENT:  Head: Normocephalic and atraumatic.  Mouth/Throat: Oropharynx is clear and moist. No oropharyngeal exudate.  Eyes: Conjunctivae are normal. Right eye exhibits no discharge. Left eye exhibits no discharge. No scleral icterus.  Neck: Normal range of motion. Neck supple.  Cardiovascular: Normal rate, regular rhythm, normal heart sounds and intact distal pulses.   Pulmonary/Chest: Effort normal and breath sounds normal. No respiratory distress. No wheezes. No rales.  Abdominal: Soft. Bowel sounds are normal. Exhibits no distension and no mass. There is no tenderness.  Musculoskeletal: Normal range of motion. Exhibits no edema.  Lymphadenopathy:    No cervical adenopathy.  Neurological: Alert and oriented to person, place, and time. Exhibits normal muscle tone. Gait normal. Coordination normal.  Skin: Skin is warm and dry. No rash noted. Not diaphoretic. No erythema. No pallor.  Psychiatric: Mood, memory and judgment normal.  Vitals reviewed.  LABORATORY DATA: Lab Results  Component Value Date   WBC 7.4 08/10/2017   HGB 9.8 (L) 08/10/2017   HCT 31.5 (L) 08/10/2017   MCV 89.5 08/10/2017   PLT 286 08/10/2017      Chemistry      Component Value Date/Time   NA 142 08/10/2017 0753   K 3.8 08/10/2017 0753   CL 106 08/10/2017 0753   CO2 24 08/10/2017 0753   BUN 11 08/10/2017 0753   CREATININE 0.78 08/10/2017 0753      Component Value Date/Time   CALCIUM 9.6 08/10/2017 0753   ALKPHOS 139 08/10/2017 0753   AST 9 08/10/2017 0753   ALT <6 08/10/2017 0753   BILITOT 0.3 08/10/2017 0753       RADIOGRAPHIC STUDIES:  Ct Chest W Contrast  Result Date: 08/05/2017 CLINICAL DATA:  Lung cancer restaging EXAM: CT CHEST WITH CONTRAST TECHNIQUE: Multidetector CT imaging of the chest was performed during  intravenous contrast administration. CONTRAST:  73m OMNIPAQUE IOHEXOL 300 MG/ML  SOLN COMPARISON:  05/21/2017 FINDINGS: Cardiovascular: Coronary artery calcification and aortic atherosclerotic calcification. Mediastinum/Nodes: Small axillary lymph nodes. No supraclavicular adenopathy. Enlarged RIGHT lower paratracheal lymph node measures 11 mm compared to 12 mm. Prevascular lymph node measures 7 mm compared to 7 mm. RIGHT hilar node measures 10 mm. Not identified on comparison noncontrast exam. Lungs/Pleura: Enlarged RIGHT upper lobe mass is decreased in size measuring 4.3 x 6.3 cm compared to 8.2 x 9.4 cm. The mass continues to share broad surface with the parietal pleura no discrete chest wall invasion is identified. No new pulmonary nodules. Upper Abdomen: Is Limited view of the liver, kidneys, pancreas are unremarkable. Normal adrenal glands. Partially imaged cystic lesion of the LEFT kidney unchanged Musculoskeletal: No aggressive osseous lesion. IMPRESSION: 1. Considerable reduction in volume of RIGHT upper lobe mass which abuts the pleural surface. 2. Stable mediastinal lymph nodes which were minimally hypermetabolic comparison PET-CT scan. Electronically Signed   By: SSuzy BouchardM.D.   On: 08/05/2017 11:50   Xr Hand Complete Left  Result Date: 07/29/2017 X-rays of the right hand show erosive changes at the PIP joints.    ASSESSMENT/PLAN:  Malignant neoplasm of bronchus of upper lobe [facility]) This is a very pleasant 62 year old [name] with stage IIIB non-small cell lung cancer, adenocarcinoma.   The patient underwent a course of concurrent chemoradiation with weekly carboplatin and paclitaxel status post 7 cycles.  She tolerated this course of treatment fairly well with no concerning complaints.  She continues to have arthralgia as well as the swelling and spasm and the left fingers.  She is awaiting appointment with rheumatology for this. She had a restaging CT scan of the  chest and is here to discuss the results.  The patient was seen with Dr. Julien Nordmann.  CT scan results and images were reviewed with the patient.  The patient has had a good response to her previous concurrent chemoradiation.  At her last visit, her granddaughter inquired about surgery.  We discussed with the patient about a referral to cardiothoracic surgery to discuss possible surgery.  The patient is not sure that she wishes to proceed with this but would like to speak with the surgeon for more details about this.  If surgery is not recommended, then we recommend proceeding with immunotherapy consisting of Infinzi every 2 weeks.  I have given the patient written information about this medication. I have encouraged the patient to bring her family members along with her to both her consultation with cardiothoracic surgery and to her next appointment with [place] for discussion of all of her options.  She plans to do this.  She will follow-up with [place] in about 2 weeks for more detailed discussion of her treatment options.  For itching, I have advised her use hydrocortisone cream and to try an antihistamine such as Claritin or Zyrtec.  She was advised to call immediately if she has any concerning symptoms in the interval. The patient voices understanding of current disease status and treatment options and is in agreement with the current care plan.  All questions were answered. The patient knows to call the clinic with any problems, questions or concerns. We can certainly see the patient much sooner if necessary.   Orders Placed This Encounter  Procedures  . Ambulatory referral to Cardiothoracic Surgery    Referral Priority:   Urgent    Referral Type:   Surgical    Referral Reason:   Specialty Services Required    Requested Specialty:   Cardiothoracic Surgery    Number of Visits Requested:   1   Mikey Bussing, [place], AGPCNP-BC, AOCNP 08/12/17   ADDENDUM: Hematology/Oncology Attending: I had a  face-to-face encounter with the patient.  I recommended her care plan.  This is a very pleasant 62 years old [name] with stage IIIB non-small cell lung cancer, adenocarcinoma status post a course of concurrent chemoradiation with weekly carboplatin and paclitaxel.  She tolerated her treatment fairly well with no concerning complaints.  The patient had repeat CT scan of the chest performed recently.  I personally and independently reviewed her scan images and discussed the results with the patient and showed her the images today. Her scan showed significant improvement of the right upper lobe lung mass and mediastinal lymphadenopathy.  The patient and her family asked about the option of surgical consultation on the previous visit. I will refer the patient to cardiothoracic surgery for evaluation and discussion of her surgical options. If the patient is not a good surgical candidate for resection, I would consider her for consolidation treatment with immunotherapy with Imfinzi (Durvalumab) 10 mg/KG every 2 weeks for a total of  1 year unless the patient develops intolerable toxicity or disease progression. We will arrange for the patient to come back for follow-up visit in 2 weeks for evaluation and more detailed discussion of her treatment options based on her surgical consultation. She was advised to call immediately if she has any concerning symptoms in the interval. Disclaimer: This note was dictated with voice recognition software. Similar sounding words can inadvertently be transcribed and may be missed upon review. Eilleen Kempf, MD 08/14/17

## 2017-08-12 ENCOUNTER — Inpatient Hospital Stay (HOSPITAL_BASED_OUTPATIENT_CLINIC_OR_DEPARTMENT_OTHER): Admitting: Oncology

## 2017-08-12 ENCOUNTER — Telehealth: Payer: Self-pay | Admitting: Internal Medicine

## 2017-08-12 ENCOUNTER — Encounter: Payer: Self-pay | Admitting: Oncology

## 2017-08-12 VITALS — BP 144/79 | HR 76 | Temp 98.3°F | Resp 18 | Ht 63.0 in | Wt 163.7 lb

## 2017-08-12 DIAGNOSIS — I10 Essential (primary) hypertension: Secondary | ICD-10-CM | POA: Diagnosis not present

## 2017-08-12 DIAGNOSIS — M1990 Unspecified osteoarthritis, unspecified site: Secondary | ICD-10-CM | POA: Diagnosis not present

## 2017-08-12 DIAGNOSIS — R252 Cramp and spasm: Secondary | ICD-10-CM | POA: Diagnosis not present

## 2017-08-12 DIAGNOSIS — R2232 Localized swelling, mass and lump, left upper limb: Secondary | ICD-10-CM | POA: Diagnosis not present

## 2017-08-12 DIAGNOSIS — R5383 Other fatigue: Secondary | ICD-10-CM | POA: Diagnosis not present

## 2017-08-12 DIAGNOSIS — Z9221 Personal history of antineoplastic chemotherapy: Secondary | ICD-10-CM | POA: Diagnosis not present

## 2017-08-12 DIAGNOSIS — C3411 Malignant neoplasm of upper lobe, right bronchus or lung: Secondary | ICD-10-CM | POA: Diagnosis not present

## 2017-08-12 DIAGNOSIS — Z923 Personal history of irradiation: Secondary | ICD-10-CM

## 2017-08-12 DIAGNOSIS — R590 Localized enlarged lymph nodes: Secondary | ICD-10-CM | POA: Diagnosis not present

## 2017-08-12 DIAGNOSIS — L299 Pruritus, unspecified: Secondary | ICD-10-CM | POA: Diagnosis not present

## 2017-08-12 DIAGNOSIS — Z79899 Other long term (current) drug therapy: Secondary | ICD-10-CM

## 2017-08-12 NOTE — Assessment & Plan Note (Signed)
This is a very pleasant 62 year old [name] with stage IIIB non-small cell lung cancer, adenocarcinoma.   The patient underwent a course of concurrent chemoradiation with weekly carboplatin and paclitaxel status post 7 cycles.  She tolerated this course of treatment fairly well with no concerning complaints.  She continues to have arthralgia as well as the swelling and spasm and the left fingers.  She is awaiting appointment with rheumatology for this. She had a restaging CT scan of the chest and is here to discuss the results.  The patient was seen with Dr. Julien Nordmann.  CT scan results and images were reviewed with the patient.  The patient has had a good response to her previous concurrent chemoradiation.  At her last visit, her granddaughter inquired about surgery.  We discussed with the patient about a referral to cardiothoracic surgery to discuss possible surgery.  The patient is not sure that she wishes to proceed with this but would like to speak with the surgeon for more details about this.  If surgery is not recommended, then we recommend proceeding with immunotherapy consisting of Infinzi every 2 weeks.  I have given the patient written information about this medication. I have encouraged the patient to bring her family members along with her to both her consultation with cardiothoracic surgery and to her next appointment with [place] for discussion of all of her options.  She plans to do this.  She will follow-up with [place] in about 2 weeks for more detailed discussion of her treatment options.  For itching, I have advised her use hydrocortisone cream and to try an antihistamine such as Claritin or Zyrtec.  She was advised to call immediately if she has any concerning symptoms in the interval. The patient voices understanding of current disease status and treatment options and is in agreement with the current care plan.  All questions were answered. The patient knows to call the clinic  with any problems, questions or concerns. We can certainly see the patient much sooner if necessary.

## 2017-08-12 NOTE — Telephone Encounter (Signed)
Appointments scheduled AVS/Calendar printed. Referral sent to [facility] and patient was given their number also per 6/26 los

## 2017-08-12 NOTE — Patient Instructions (Signed)
Durvalumab injection  What is this medicine?  DURVALUMAB (dur VAL ue mab) is a monoclonal antibody. It is used to treat urothelial cancer.  This medicine may be used for other purposes; ask your health care provider or pharmacist if you have questions.  COMMON BRAND NAME(S): IMFINZI  What should I tell my health care provider before I take this medicine?  They need to know if you have any of these conditions:  -diabetes  -immune system problems  -infection  -inflammatory bowel disease  -kidney disease  -liver disease  -lung or breathing disease  -lupus  -organ transplant  -stomach or intestine problems  -thyroid disease  -an unusual or allergic reaction to durvalumab, other medicines, foods, dyes, or preservatives  -pregnant or trying to get pregnant  -breast-feeding  How should I use this medicine?  This medicine is for infusion into a vein. It is given by a health care professional in a hospital or clinic setting.  A special MedGuide will be given to you before each treatment. Be sure to read this information carefully each time.  Talk to your pediatrician regarding the use of this medicine in children. Special care may be needed.  Overdosage: If you think you have taken too much of this medicine contact a poison control center or emergency room at once.  NOTE: This medicine is only for you. Do not share this medicine with others.  What if I miss a dose?  It is important not to miss your dose. Call your doctor or health care professional if you are unable to keep an appointment.  What may interact with this medicine?  Interactions have not been studied.  This list may not describe all possible interactions. Give your health care provider a list of all the medicines, herbs, non-prescription drugs, or dietary supplements you use. Also tell them if you smoke, drink alcohol, or use illegal drugs. Some items may interact with your medicine.  What should I watch for while using this medicine?  This drug may make you  feel generally unwell. Continue your course of treatment even though you feel ill unless your doctor tells you to stop.  You may need blood work done while you are taking this medicine.  Do not become pregnant while taking this medicine or for 3 months after stopping it. Women should inform their doctor if they wish to become pregnant or think they might be pregnant. There is a potential for serious side effects to an unborn child. Talk to your health care professional or pharmacist for more information. Do not breast-feed an infant while taking this medicine or for 3 months after stopping it.  What side effects may I notice from receiving this medicine?  Side effects that you should report to your doctor or health care professional as soon as possible:  -allergic reactions like skin rash, itching or hives, swelling of the face, lips, or tongue  -black, tarry stools  -bloody or watery diarrhea  -breathing problems  -change in emotions or moods  -change in sex drive  -changes in vision  -chest pain or chest tightness  -chills  -confusion  -cough  -facial flushing  -fever  -headache  -signs and symptoms of high blood sugar such as dizziness; dry mouth; dry skin; fruity breath; nausea; stomach pain; increased hunger or thirst; increased urination  -signs and symptoms of liver injury like dark yellow or brown urine; general ill feeling or flu-like symptoms; light-colored stools; loss of appetite; nausea; right upper belly pain;   unusually weak or tired; yellowing of the eyes or skin  -stomach pain  -trouble passing urine or change in the amount of urine  -weight gain or weight loss  Side effects that usually do not require medical attention (report these to your doctor or health care professional if they continue or are bothersome):  -bone pain  -constipation  -loss of appetite  -muscle pain  -nausea  -swelling of the ankles, feet, hands  -tiredness  This list may not describe all possible side effects. Call your doctor  for medical advice about side effects. You may report side effects to FDA at 1-800-FDA-1088.  Where should I keep my medicine?  This drug is given in a hospital or clinic and will not be stored at home.  NOTE: This sheet is a summary. It may not cover all possible information. If you have questions about this medicine, talk to your doctor, pharmacist, or health care provider.  © 2018 [employer]vier/Gold Standard (2015-09-07 15:50:36)

## 2017-08-18 ENCOUNTER — Encounter: Payer: Self-pay | Admitting: Cardiothoracic Surgery

## 2017-08-18 ENCOUNTER — Other Ambulatory Visit: Payer: Self-pay

## 2017-08-18 ENCOUNTER — Institutional Professional Consult (permissible substitution) (INDEPENDENT_AMBULATORY_CARE_PROVIDER_SITE_OTHER): Admitting: Cardiothoracic Surgery

## 2017-08-18 VITALS — BP 152/81 | HR 78 | Resp 16 | Ht 63.0 in | Wt 163.0 lb

## 2017-08-18 DIAGNOSIS — C801 Malignant (primary) neoplasm, unspecified: Secondary | ICD-10-CM | POA: Diagnosis not present

## 2017-08-18 DIAGNOSIS — C3411 Malignant neoplasm of upper lobe, right bronchus or lung: Secondary | ICD-10-CM | POA: Diagnosis not present

## 2017-08-18 NOTE — Progress Notes (Signed)
PCP is [name], MD Referring Provider is Maryanna Shape, NP  Chief Complaint  Patient presents with  . Lung Cancer    RULobe.Marland KitchenPET 4/4, CT CHEST 08/05/17, NEEDLE BX 04/28/17  Surgical opinion for lobectomy in this 62 year old female patient with stage III adenocarcinoma post chemoradiation l  HPI: I was asked to evaluate this very nice 62 year old reformed smoker who was diagnosed with advanced stage adenocarcinoma of the right lung in April of this year.  She underwent at least 5 rounds of chemotherapy and a total of 66 GY dose of radiation to the right lung hilum and mediastinum.  A follow-up scan showed reduction in size of the primary tumor from 8 cm to 4 cm.  There is still is evidence of mediastinal involvement however.  Lobectomy would be very high risk following radiation and I would not recommend surgical therapy for the patient at this point.  The patient feels remarkably well at this point without pain, good exercise tolerance, and good functional status.  She is enjoying being with family, being active, and further mutation based therapy would definitely be better for the patient than surgery.   Past Medical History:  Diagnosis Date  . Compression fracture of lumbar vertebra ([place])   . Hypertension     Past Surgical History:  Procedure Laterality Date  . BACK SURGERY  2009   ruptured disc    Family History  Problem Relation Age of Onset  . Hypertension Mother   . CAD Mother   . Heart attack Mother   . Diabetes Father   . CAD Father   . Bone cancer Brother     Social History Social History   Tobacco Use  . Smoking status: Former Smoker    Years: 35.00    Types: Cigarettes    Start date: 1978    Last attempt to quit: 04/28/2017    Years since quitting: 0.3  . Smokeless tobacco: Never Used  Substance Use Topics  . Alcohol use: Yes    Alcohol/week: 1.2 - 1.8 oz    Types: 2 - 3 Cans of beer per week    Comment: occasionally   . Drug use: Never    Current  Outpatient Medications  Medication Sig Dispense Refill  . clotrimazole-betamethasone (LOTRISONE) cream Apply 1 application topically 2 (two) times daily. 45 g 1  . lisinopril-hydrochlorothiazide (PRINZIDE,ZESTORETIC) 10-12.5 MG tablet Take 1 tablet by mouth daily.    Marland Kitchen acetaminophen (TYLENOL) 500 MG tablet Take 1,000 mg by mouth every 6 (six) hours as needed for mild pain or moderate pain.    Marland Kitchen amLODipine (NORVASC) 10 MG tablet Take 10 mg by mouth daily.    . cetirizine (ZYRTEC) 10 MG tablet Take 10 mg by mouth daily as needed for allergies.    Marland Kitchen loperamide (IMODIUM) 2 MG capsule Take 4 mg by mouth as needed for diarrhea or loose stools.    Marland Kitchen spironolactone (ALDACTONE) 25 MG tablet Take 25 mg by mouth daily.    . sucralfate (CARAFATE) 1 GM/10ML suspension Take 10 mLs (1 g total) by mouth 4 (four) times daily -  with meals and at bedtime. (Patient not taking: Reported on 08/18/2017) 420 mL 0   No current facility-administered medications for this visit.     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 mainly in some and needs 6 pound weight loss with chemotherapy Mild ankle edema and foot numbness No shortness of breath or chest  pain No right shoulder pain No difficulty swallowing Productive cough in the mornings  BP (!) 152/81 (BP Location: Right Arm, Patient Position: Sitting, Cuff Size: Large)   Pulse 78   Resp 16   Ht 5\' 3"  (1.6 m)   Wt 163 lb (73.9 kg)   SpO2 98% Comment: ON RA  BMI 28.87 kg/m  Physical Exam      Exam    General- alert and comfortable    Neck- no JVD, no cervical adenopathy palpable, no carotid bruit   Lungs- clear without rales, wheezes   Cor- regular rate and rhythm, no murmur , gallop   Abdomen- soft, non-tender   Extremities - warm, non-tender, minimal edema   Neuro- oriented, appropriate, no focal weakness   Diagnostic Tests: Last chest x-ray and CT scans of chest and PET scan  reviewed  Impression: 62 year old female who presented with stage IIIb adenocarcinoma of the right upper lobe with a 8 cm primary treated with high-dose radiation and 5 courses of chemotherapy with good response and  excellent functional level.  She would not benefit from lobectomy because it appears she is still stage III and radiation would significantly increase the risk of complications/death with surgery.  Plan: Continue medical therapy and observation   Len Childs, MD [facility] [phone]

## 2017-08-21 ENCOUNTER — Telehealth: Payer: Self-pay | Admitting: *Deleted

## 2017-08-21 NOTE — Telephone Encounter (Signed)
CALLED PATIENT TO INFORM THAT FU APPT. HAS BEEN MOVED TO 09/02/17 @ 9 AM, LVM FOR A RETURN CALL

## 2017-08-25 ENCOUNTER — Ambulatory Visit: Admission: RE | Admit: 2017-08-25 | Source: Ambulatory Visit | Admitting: Radiation Oncology

## 2017-08-27 ENCOUNTER — Other Ambulatory Visit: Payer: Self-pay | Admitting: Internal Medicine

## 2017-08-27 ENCOUNTER — Telehealth: Payer: Self-pay | Admitting: Nurse Practitioner

## 2017-08-27 ENCOUNTER — Encounter: Payer: Self-pay | Admitting: Nurse Practitioner

## 2017-08-27 ENCOUNTER — Inpatient Hospital Stay: Attending: Internal Medicine | Admitting: Nurse Practitioner

## 2017-08-27 VITALS — BP 120/71 | HR 78 | Temp 98.1°F | Resp 18 | Ht 63.0 in | Wt 163.0 lb

## 2017-08-27 DIAGNOSIS — Z5112 Encounter for antineoplastic immunotherapy: Secondary | ICD-10-CM | POA: Insufficient documentation

## 2017-08-27 DIAGNOSIS — I10 Essential (primary) hypertension: Secondary | ICD-10-CM | POA: Diagnosis not present

## 2017-08-27 DIAGNOSIS — Z79899 Other long term (current) drug therapy: Secondary | ICD-10-CM | POA: Diagnosis not present

## 2017-08-27 DIAGNOSIS — Z923 Personal history of irradiation: Secondary | ICD-10-CM | POA: Insufficient documentation

## 2017-08-27 DIAGNOSIS — Z9221 Personal history of antineoplastic chemotherapy: Secondary | ICD-10-CM | POA: Insufficient documentation

## 2017-08-27 DIAGNOSIS — C3411 Malignant neoplasm of upper lobe, right bronchus or lung: Secondary | ICD-10-CM | POA: Insufficient documentation

## 2017-08-27 DIAGNOSIS — R590 Localized enlarged lymph nodes: Secondary | ICD-10-CM | POA: Insufficient documentation

## 2017-08-27 NOTE — Progress Notes (Signed)
DISCONTINUE ON PATHWAY REGIMEN - Non-Small Cell Lung     Administer weekly:     Paclitaxel      Carboplatin   **Always confirm dose/schedule in your pharmacy ordering system**  REASON: Continuation Of Treatment PRIOR TREATMENT: TTS177: Carboplatin AUC=2 + Paclitaxel 45 mg/m2 Weekly During Radiation TREATMENT RESPONSE: Partial Response (PR)  START ON PATHWAY REGIMEN - Non-Small Cell Lung     A cycle is every 14 days:     Durvalumab   **Always confirm dose/schedule in your pharmacy ordering system**  Patient Characteristics: Stage III - Unresectable, PS = 0, 1 AJCC T Category: T4 Current Disease Status: No Distant Mets or Local Recurrence AJCC N Category: N2 AJCC M Category: M0 AJCC 8 Stage Grouping: IIIB Performance Status: PS = 0, 1 Intent of Therapy: Curative Intent, Discussed with Patient

## 2017-08-27 NOTE — Telephone Encounter (Signed)
Scheduled appt per 7/11 los - gave pt AVS and calender - printed patient 3 copies.

## 2017-08-27 NOTE — Progress Notes (Addendum)
[facility]  Telephone:([phone]) [phone] [phone]  Clinic Follow up Note   Patient Care Team: [name], MD as PCP - General (Family Medicine) 08/27/2017  DIAGNOSIS: stageIIIB (T3, N2, M0)non-small cell lung cancer, adenocarcinoma diagnosed in March 2019 and presented with large right upper lobe lung mass in addition to right hilar and mediastinal lymphadenopathy.  PD-L10%  PRIOR THERAPY: Concurrent chemoradiation with weekly carboplatin for an AUC of 2 and paclitaxel 45 mg/m2. First dose expected on 06/01/2017. Status post 7cycles.Last dose was giving 07/14/2017.  CURRENT THERAPY: None   INTERVAL HISTORY: Ms. Hecker returns for follow up to discuss treatment plan. She saw Dr. Prescott Gum on 08/18/17 for surgical opinion. She feels great. Energy and appetite are normal. Denies cough, chest pain, dyspnea, hemoptysis, wheezing, fever, n/v/c/d, or pain. She is always cold. She has no specific concerns.   REVIEW OF SYSTEMS:   Constitutional: Denies fevers, chills, fatigue, or abnormal weight loss (+) always cold  Ears, nose, mouth, throat, and face: Denies mucositis or sore throat Respiratory: Denies cough, dyspnea, hemoptysis, or wheezes Cardiovascular: Denies palpitation, chest discomfort or lower extremity swelling Gastrointestinal:  Denies nausea, vomiting, constipation, diarrhea, heartburn or change in bowel habits Skin: Denies abnormal skin rashes (+) skin darkening secondary to radiation, improving  Lymphatics: Denies new lymphadenopathy or easy bruising Neurological:Denies numbness or new weaknesses (+) tingling to right foot, secondary to remote back surgery  Behavioral/Psych: Mood is stable, no new changes  All other systems were reviewed with the patient and are negative.  MEDICAL HISTORY:  Past Medical History:  Diagnosis Date  . Compression fracture of lumbar vertebra ([place])   . Hypertension     SURGICAL HISTORY: Past Surgical History:    Procedure Laterality Date  . BACK SURGERY  2009   ruptured disc    I have reviewed the social history and family history with the patient and they are unchanged from previous note.  ALLERGIES:  has No Known Allergies.  MEDICATIONS:  Current Outpatient Medications  Medication Sig Dispense Refill  . amLODipine (NORVASC) 10 MG tablet Take 10 mg by mouth daily.    Marland Kitchen lisinopril-hydrochlorothiazide (PRINZIDE,ZESTORETIC) 10-12.5 MG tablet Take 1 tablet by mouth daily.    Marland Kitchen acetaminophen (TYLENOL) 500 MG tablet Take 1,000 mg by mouth every 6 (six) hours as needed for mild pain or moderate pain.    . cetirizine (ZYRTEC) 10 MG tablet Take 10 mg by mouth daily as needed for allergies.    . clotrimazole-betamethasone (LOTRISONE) cream Apply 1 application topically 2 (two) times daily. 45 g 1  . loperamide (IMODIUM) 2 MG capsule Take 4 mg by mouth as needed for diarrhea or loose stools.    . prochlorperazine (COMPAZINE) 5 MG tablet Take 1 tablet by mouth every 6 (six) hours as needed.  0  . spironolactone (ALDACTONE) 25 MG tablet Take 25 mg by mouth daily.    . sucralfate (CARAFATE) 1 GM/10ML suspension Take 10 mLs (1 g total) by mouth 4 (four) times daily -  with meals and at bedtime. (Patient not taking: Reported on 08/18/2017) 420 mL 0   No current facility-administered medications for this visit.     PHYSICAL EXAMINATION: ECOG PERFORMANCE STATUS: 0 - Asymptomatic  Vitals:   08/27/17 0921  BP: 120/71  Pulse: 78  Resp: 18  Temp: 98.1 F (36.7 C)  SpO2: 99%   Filed Weights   08/27/17 0921  Weight: 163 lb (73.9 kg)    GENERAL:alert, no distress and comfortable SKIN: no  rashes or significant lesions EYES: normal, Conjunctiva are pink and non-injected, sclera clear OROPHARYNX:no thrush or ulcers LYMPH:  no palpable cervical or supraclavicular lymphadenopathy LUNGS: clear to auscultation with normal breathing effort HEART: regular rate & rhythm and no murmurs and no lower extremity  edema ABDOMEN:abdomen soft, non-tender and normal bowel sounds Musculoskeletal:no cyanosis of digits and no clubbing  NEURO: alert & oriented x 3 with fluent speech, no focal motor/sensory deficits  LABORATORY DATA:  I have reviewed the data as listed CBC Latest Ref Rng & Units 08/10/2017 07/23/2017 07/14/2017  WBC 3.9 - 10.3 K/uL 7.4 3.7(L) 3.8(L)  Hemoglobin 11.6 - 15.9 g/dL 9.8(L) 9.5(L) 9.5(L)  Hematocrit 34.8 - 46.6 % 31.5(L) 30.0(L) 30.4(L)  Platelets 145 - 400 K/uL 286 229 283     CMP Latest Ref Rng & Units 08/10/2017 07/23/2017 07/14/2017  Glucose 70 - 140 mg/dL 103 106 99  BUN 7 - 26 mg/dL 11 10 11   Creatinine 0.60 - 1.10 mg/dL 0.78 0.82 0.58  Sodium 136 - 145 mmol/L 142 144 143  Potassium 3.5 - 5.1 mmol/L 3.8 3.5 4.5  Chloride 98 - 109 mmol/L 106 109 108  CO2 22 - 29 mmol/L 24 25 24   Calcium 8.4 - 10.4 mg/dL 9.6 8.9 8.9  Total Protein 6.4 - 8.3 g/dL 7.7 7.3 7.3  Total Bilirubin 0.2 - 1.2 mg/dL 0.3 0.2 0.2(L)  Alkaline Phos 40 - 150 U/L 139 157(H) 128(H)  AST 5 - 34 U/L 9 9 12(L)  ALT 0 - 55 U/L <6 <6 9(L)      RADIOGRAPHIC STUDIES: I have personally reviewed the radiological images as listed and agreed with the findings in the report. No results found.   ASSESSMENT & PLAN:  Malignant neoplasm of bronchus of upper lobe (HCC) This is a very pleasant 62 year old [name] with stage IIIB non-small cell lung cancer, adenocarcinoma. The patient underwent a course of concurrent chemoradiation with weekly carboplatin and paclitaxel status post 7 cycles. She tolerated this course of treatment fairly well. She underwent restaging CT which showed significant improvement of the right upper lobe lung mass mediastinal lymphadenopathy. She was seen by cardiothoracic surgeon Dr. Prescott Gum at the family's request; he does not recommend surgery. She is currently asymptomatic with good performance status.   The patient was seen with Dr. Julien Nordmann who recommends treatment with  immunotherapy Durvalumab every 2 weeks, plan to continue treatment as long as she tolerates and disease is controlled for up to 1 year.  Will obtain interim restaging to monitor response to therapy. Dr. Julien Nordmann reviewed potential side effects. She agrees to proceed. Will check TSH every other treatment, CBC and CMP with each treatment. Plan to start next week. She will return in 3 weeks for f/u prior to cycle 2. She has a family trip coming up in August, which she was encouraged to attend and enjoy. Treatment can be adjusted.    Orders Placed This Encounter  Procedures  . TSH    Standing Status:   Standing    Number of Occurrences:   26    Standing Expiration Date:   08/28/2018   All questions were answered. The patient knows to call the clinic with any problems, questions or concerns. No barriers to learning was detected.     Alla Feeling, NP 08/27/17   ADDENDUM: Hematology/Oncology Attending: I had a face-to-face encounter with the patient today.  I recommended her care plan.  This is a very pleasant 62 years old African-American  female with a stage IIIB non-small cell lung cancer, adenocarcinoma status post a course of concurrent chemoradiation with weekly carboplatin and paclitaxel with a significant response to her treatment.  The patient was seen by cardiothoracic surgery Dr. Prescott Gum based on the request of her family for evaluation for surgical resection.  He indicated that the patient is not a good surgical candidate for resection. She is here today for evaluation and recommendation regarding further consolidation treatment. I had a lengthy discussion with the patient and her granddaughter about her current condition and treatment options.  I gave the patient the option of continuous observation and monitoring versus proceeding with consolidation treatment with immunotherapy with Imfinzi (Durvalumab) 10 mg/KG every 2 weeks for a total of 1 year unless the patient has evidence for  disease progression or intolerable toxicity. The patient is interested in proceeding with the consolidation treatment.  I discussed with her the adverse effect of this treatment including but not limited to immunotherapy mediated skin rash, diarrhea, inflammation of the lung, kidney, liver, thyroid or other endocrine dysfunction including type 1 diabetes mellitus. She is expected to start the first cycle of this treatment next week. The patient will come back for follow-up visit in 3 weeks for evaluation before starting cycle #2. She was advised to call immediately if she has any concerning symptoms in the interval.  Disclaimer: This note was dictated with voice recognition software. Similar sounding words can inadvertently be transcribed and may be missed upon review. Eilleen Kempf, MD 08/27/17

## 2017-09-02 ENCOUNTER — Ambulatory Visit: Admission: RE | Admit: 2017-09-02 | Source: Ambulatory Visit | Admitting: Radiation Oncology

## 2017-09-03 ENCOUNTER — Encounter: Payer: Self-pay | Admitting: Radiation Oncology

## 2017-09-03 ENCOUNTER — Inpatient Hospital Stay

## 2017-09-03 ENCOUNTER — Ambulatory Visit
Admission: RE | Admit: 2017-09-03 | Discharge: 2017-09-03 | Disposition: A | Discharge status: Home / Self Care | Source: Ambulatory Visit | Attending: Radiation Oncology | Admitting: Radiation Oncology

## 2017-09-03 VITALS — BP 122/70 | HR 75 | Temp 98.6°F | Resp 18

## 2017-09-03 VITALS — BP 130/69 | HR 75 | Temp 98.1°F | Resp 16 | Ht 63.0 in | Wt 163.6 lb

## 2017-09-03 DIAGNOSIS — Z79899 Other long term (current) drug therapy: Secondary | ICD-10-CM | POA: Diagnosis not present

## 2017-09-03 DIAGNOSIS — C3411 Malignant neoplasm of upper lobe, right bronchus or lung: Secondary | ICD-10-CM | POA: Insufficient documentation

## 2017-09-03 DIAGNOSIS — C3490 Malignant neoplasm of unspecified part of unspecified bronchus or lung: Secondary | ICD-10-CM

## 2017-09-03 DIAGNOSIS — Z923 Personal history of irradiation: Secondary | ICD-10-CM | POA: Insufficient documentation

## 2017-09-03 DIAGNOSIS — R197 Diarrhea, unspecified: Secondary | ICD-10-CM | POA: Insufficient documentation

## 2017-09-03 DIAGNOSIS — Z5112 Encounter for antineoplastic immunotherapy: Secondary | ICD-10-CM | POA: Diagnosis not present

## 2017-09-03 LAB — CBC WITH DIFFERENTIAL (CANCER CENTER ONLY)
BASOS ABS: 0 10*3/uL (ref 0.0–0.1)
Basophils Relative: 0 %
EOS ABS: 0.2 10*3/uL (ref 0.0–0.5)
Eosinophils Relative: 3 %
HEMATOCRIT: 33.2 % — AB (ref 34.8–46.6)
Hemoglobin: 10.9 g/dL — ABNORMAL LOW (ref 11.6–15.9)
Lymphocytes Relative: 17 %
Lymphs Abs: 1.1 10*3/uL (ref 0.9–3.3)
MCH: 28.2 pg (ref 25.1–34.0)
MCHC: 32.7 g/dL (ref 31.5–36.0)
MCV: 86.4 fL (ref 79.5–101.0)
MONO ABS: 0.6 10*3/uL (ref 0.1–0.9)
Monocytes Relative: 10 %
NEUTROS ABS: 4.6 10*3/uL (ref 1.5–6.5)
NEUTROS PCT: 70 %
Platelet Count: 367 10*3/uL (ref 145–400)
RBC: 3.84 MIL/uL (ref 3.70–5.45)
RDW: 19.5 % — AB (ref 11.2–14.5)
WBC Count: 6.5 10*3/uL (ref 3.9–10.3)

## 2017-09-03 LAB — CMP (CANCER CENTER ONLY)
ALK PHOS: 135 U/L — AB (ref 38–126)
ALT: 8 U/L (ref 0–44)
ANION GAP: 10 (ref 5–15)
AST: 10 U/L — ABNORMAL LOW (ref 15–41)
Albumin: 3.4 g/dL — ABNORMAL LOW (ref 3.5–5.0)
BILIRUBIN TOTAL: 0.3 mg/dL (ref 0.3–1.2)
BUN: 26 mg/dL — ABNORMAL HIGH (ref 8–23)
CALCIUM: 10 mg/dL (ref 8.9–10.3)
CO2: 26 mmol/L (ref 22–32)
CREATININE: 0.95 mg/dL (ref 0.44–1.00)
Chloride: 107 mmol/L (ref 98–111)
GFR, Estimated: >60 mL/min (ref >60)
GLUCOSE: 96 mg/dL (ref 70–99)
Potassium: 4.2 mmol/L (ref 3.5–5.1)
Sodium: 143 mmol/L (ref 135–145)
TOTAL PROTEIN: 8.1 g/dL (ref 6.5–8.1)

## 2017-09-03 LAB — TSH: TSH: 0.322 u[IU]/mL (ref 0.308–3.960)

## 2017-09-03 MED ORDER — DURVALUMAB 500 MG/10ML IV SOLN
10.0000 mg/kg | Freq: Once | INTRAVENOUS | Status: AC
  Administered 2017-09-03: 740 mg via INTRAVENOUS
  Filled 2017-09-03: qty 10

## 2017-09-03 MED ORDER — SODIUM CHLORIDE 0.9 % IV SOLN
Freq: Once | INTRAVENOUS | Status: AC
  Administered 2017-09-03: 15:00:00 via INTRAVENOUS

## 2017-09-03 NOTE — Addendum Note (Signed)
Encounter addended by: Malena Edman, RN on: 09/03/2017 1:43 PM  Actions taken: Charge Capture section accepted

## 2017-09-03 NOTE — Patient Instructions (Addendum)
[facility] Discharge Instructions for Patients Receiving Chemotherapy  Today you received the following chemotherapy agents Imfinzi  To help prevent nausea and vomiting after your treatment, we encourage you to take your nausea medication as directed   If you develop nausea and vomiting that is not controlled by your nausea medication, call the clinic.   BELOW ARE SYMPTOMS THAT SHOULD BE REPORTED IMMEDIATELY:  *FEVER GREATER THAN 100.5 F  *CHILLS WITH OR WITHOUT FEVER  NAUSEA AND VOMITING THAT IS NOT CONTROLLED WITH YOUR NAUSEA MEDICATION  *UNUSUAL SHORTNESS OF BREATH  *UNUSUAL BRUISING OR BLEEDING  TENDERNESS IN MOUTH AND THROAT WITH OR WITHOUT PRESENCE OF ULCERS  *URINARY PROBLEMS  *BOWEL PROBLEMS  UNUSUAL RASH Items with * indicate a potential emergency and should be followed up as soon as possible.  Feel free to call the clinic should you have any questions or concerns. The clinic phone number is [phone].  Please show the [place] at check-in to the Emergency Department and triage nurse.  Durvalumab injection What is this medicine? DURVALUMAB (dur VAL ue mab) is a monoclonal antibody. It is used to treat urothelial cancer. This medicine may be used for other purposes; ask your health care provider or pharmacist if you have questions. COMMON BRAND NAME(S): IMFINZI What should I tell my health care provider before I take this medicine? They need to know if you have any of these conditions: -diabetes -immune system problems -infection -inflammatory bowel disease -kidney disease -liver disease -lung or breathing disease -lupus -organ transplant -stomach or intestine problems -thyroid disease -an unusual or allergic reaction to durvalumab, other medicines, foods, dyes, or preservatives -pregnant or trying to get pregnant -breast-feeding How should I use this medicine? This medicine is for infusion into a vein. It is given by a  health care professional in a hospital or clinic setting. A special MedGuide will be given to you before each treatment. Be sure to read this information carefully each time. Talk to your pediatrician regarding the use of this medicine in children. Special care may be needed. Overdosage: If you think you have taken too much of this medicine contact a poison control center or emergency room at once. NOTE: This medicine is only for you. Do not share this medicine with others. What if I miss a dose? It is important not to miss your dose. Call your doctor or health care professional if you are unable to keep an appointment. What may interact with this medicine? Interactions have not been studied. This list may not describe all possible interactions. Give your health care provider a list of all the medicines, herbs, non-prescription drugs, or dietary supplements you use. Also tell them if you smoke, drink alcohol, or use illegal drugs. Some items may interact with your medicine. What should I watch for while using this medicine? This drug may make you feel generally unwell. Continue your course of treatment even though you feel ill unless your doctor tells you to stop. You may need blood work done while you are taking this medicine. Do not become pregnant while taking this medicine or for 3 months after stopping it. Women should inform their doctor if they wish to become pregnant or think they might be pregnant. There is a potential for serious side effects to an unborn child. Talk to your health care professional or pharmacist for more information. Do not breast-feed an infant while taking this medicine or for 3 months after stopping it. What side effects may  I notice from receiving this medicine? Side effects that you should report to your doctor or health care professional as soon as possible: -allergic reactions like skin rash, itching or hives, swelling of the face, lips, or tongue -black, tarry  stools -bloody or watery diarrhea -breathing problems -change in emotions or moods -change in sex drive -changes in vision -chest pain or chest tightness -chills -confusion -cough -facial flushing -fever -headache -signs and symptoms of high blood sugar such as dizziness; dry mouth; dry skin; fruity breath; nausea; stomach pain; increased hunger or thirst; increased urination -signs and symptoms of liver injury like dark yellow or brown urine; general ill feeling or flu-like symptoms; light-colored stools; loss of appetite; nausea; right upper belly pain; unusually weak or tired; yellowing of the eyes or skin -stomach pain -trouble passing urine or change in the amount of urine -weight gain or weight loss Side effects that usually do not require medical attention (report these to your doctor or health care professional if they continue or are bothersome): -bone pain -constipation -loss of appetite -muscle pain -nausea -swelling of the ankles, feet, hands -tiredness This list may not describe all possible side effects. Call your doctor for medical advice about side effects. You may report side effects to FDA at 1-800-FDA-1088. Where should I keep my medicine? This drug is given in a hospital or clinic and will not be stored at home. NOTE: This sheet is a summary. It may not cover all possible information. If you have questions about this medicine, talk to your doctor, pharmacist, or health care provider.  2018 Elsevier/Gold Standard (2015-09-07 15:50:36)

## 2017-09-03 NOTE — Progress Notes (Signed)
Radiation Oncology         [phone] ________________________________  Name: Ashley Wilson MRN: [id number]  Date of Service: 09/03/2017 DOB: 1955/04/21  Post Treatment Note  CC: [name], MD  Curt Bears, MD  Diagnosis: Stage IIIB, T3N2M0 NSCLC, adenocarcinoma of the RUL     Interval Since Last Radiation:  7 weeks   06/02/17 - 07/17/17:  RUL treated to 60 Gy with 30 fx of 2 Gy followed by a boost of 6 Gy with 3 fx of 2 Gy   Narrative:  The patient returns today for routine follow-up.  She tolerated radiotherapy well. She did have diarrhea during therapy that was felt to be due to her chemotherapy.  She's scheduled for immunotherapy consolidation today.                             On review of systems, the patient states she's doing well and is not having any symptoms of esophagitis, abdominal pain, diarrhea, or difficulty with shortness of breath, chest pain, cough, or fever. No other complaints are verbalized.  ALLERGIES:  has No Known Allergies.  Meds: Current Outpatient Medications  Medication Sig Dispense Refill  . amLODipine (NORVASC) 10 MG tablet Take 10 mg by mouth daily.    Marland Kitchen lisinopril-hydrochlorothiazide (PRINZIDE,ZESTORETIC) 10-12.5 MG tablet Take 1 tablet by mouth daily.    Marland Kitchen spironolactone (ALDACTONE) 25 MG tablet Take 25 mg by mouth daily.    Marland Kitchen acetaminophen (TYLENOL) 500 MG tablet Take 1,000 mg by mouth every 6 (six) hours as needed for mild pain or moderate pain.    . cetirizine (ZYRTEC) 10 MG tablet Take 10 mg by mouth daily as needed for allergies.    . clotrimazole-betamethasone (LOTRISONE) cream Apply 1 application topically 2 (two) times daily. (Patient not taking: Reported on 09/03/2017) 45 g 1  . loperamide (IMODIUM) 2 MG capsule Take 4 mg by mouth as needed for diarrhea or loose stools.    . prochlorperazine (COMPAZINE) 5 MG tablet Take 1 tablet by mouth every 6 (six) hours as needed.  0  . sucralfate (CARAFATE) 1 GM/10ML suspension Take 10 mLs (1 g  total) by mouth 4 (four) times daily -  with meals and at bedtime. (Patient not taking: Reported on 09/03/2017) 420 mL 0   No current facility-administered medications for this encounter.     Physical Findings:  height is 5\' 3"  (1.6 m) and weight is 163 lb 9.6 oz (74.2 kg). Her oral temperature is 98.1 F (36.7 C). Her blood pressure is 130/69 and her pulse is 75. Her respiration is 16 and oxygen saturation is 99%.  Pain Assessment Pain Score: 0-No pain/10 In general this is a well appearing African American female in no acute distress. She's alert and oriented x4 and appropriate throughout the examination. Cardiopulmonary assessment is negative for acute distress and she exhibits normal effort. Chest is clear to auscultation bilaterally. There is mild hyperpigmentation along the upper right anterior chest. No desquamation is noted.  Lab Findings: Lab Results  Component Value Date   WBC 6.5 09/03/2017   HGB 10.9 (L) 09/03/2017   HCT 33.2 (L) 09/03/2017   MCV 86.4 09/03/2017   PLT 367 09/03/2017     Radiographic Findings: Ct Chest W Contrast  Result Date: 08/05/2017 CLINICAL DATA:  Lung cancer restaging EXAM: CT CHEST WITH CONTRAST TECHNIQUE: Multidetector CT imaging of the chest was performed during intravenous contrast administration. CONTRAST:  80mL OMNIPAQUE IOHEXOL  300 MG/ML  SOLN COMPARISON:  05/21/2017 FINDINGS: Cardiovascular: Coronary artery calcification and aortic atherosclerotic calcification. Mediastinum/Nodes: Small axillary lymph nodes. No supraclavicular adenopathy. Enlarged RIGHT lower paratracheal lymph node measures 11 mm compared to 12 mm. Prevascular lymph node measures 7 mm compared to 7 mm. RIGHT hilar node measures 10 mm. Not identified on comparison noncontrast exam. Lungs/Pleura: Enlarged RIGHT upper lobe mass is decreased in size measuring 4.3 x 6.3 cm compared to 8.2 x 9.4 cm. The mass continues to share broad surface with the parietal pleura no discrete chest wall  invasion is identified. No new pulmonary nodules. Upper Abdomen: Is Limited view of the liver, kidneys, pancreas are unremarkable. Normal adrenal glands. Partially imaged cystic lesion of the LEFT kidney unchanged Musculoskeletal: No aggressive osseous lesion. IMPRESSION: 1. Considerable reduction in volume of RIGHT upper lobe mass which abuts the pleural surface. 2. Stable mediastinal lymph nodes which were minimally hypermetabolic comparison PET-CT scan. Electronically Signed   By: Suzy Bouchard M.D.   On: 08/05/2017 11:50    Impression/Plan: 1. Stage IIIB, T3N2M0 NSCLC, adenocarcinoma of the RUL. The patient is doing well since completing radiotherapy. She will continue with immunotherapy and see Dr. Julien Nordmann for this therapy. We will see her as needed moving forward and she was given precautions to call if she has questions or concerns.      Carola Rhine, PAC

## 2017-09-16 NOTE — Progress Notes (Signed)
[place]  [phone] [phone]) [phone]  Clinic Follow up Note   Patient Care Team: [name], MD as PCP - General (Family Medicine) 09/17/2017   DIAGNOSIS:stageIIIB (T3, N2, M0)non-small cell lung cancer, adenocarcinoma diagnosed in March 2019 and presented with large right upper lobe lung mass in addition to right hilar and mediastinal lymphadenopathy.  PD-L10%  PRIOR THERAPY:Concurrent chemoradiation with weekly carboplatin for an AUC of 2 and paclitaxel 45 mg/m2. First dose expected on 06/01/2017. Status post 7cycles.Last dose was giving 07/14/2017.  CURRENT THERAPY: consolidation immunotherapy with (durvalumab) Imfinzi 10 mg/kg q2 weeks for a total of 1 year began 09/03/17, s/p cycle 1   INTERVAL HISTORY: Ms. Shimmel returns for follow up and cycle 2 Imfinzi as scheduled. She completed cycle 1 on 09/03/17. She did well. She developed an itching raised skin rash to her chest last week that she attributes to wearing perfume, jewelry, and a new outfit. She began applying lotrisone cream from previous prescription and it is improving. No other rashes. She had 1 episode of diarrhea this morning, seems to be triggered by alcohol and occurred one other time since last treatment. She takes imodium PRN for control. She denies cough, chest pain, dyspnea, hemoptysis, or wheezing. No recent fever or chills.   REVIEW OF SYSTEMS:   Constitutional: Denies fevers, chills or abnormal weight loss Ears, nose, mouth, throat, and face: Denies mucositis or sore throat Respiratory: Denies cough, dyspnea, hemoptysis, or wheezes Cardiovascular: Denies palpitation, chest discomfort or lower extremity swelling Gastrointestinal:  Denies nausea, vomiting, constipation, diarrhea, heartburn or change in bowel habits Skin: (+) pruritic skin rash to upper chest x1 week, improving with topical steroid  Lymphatics: Denies new lymphadenopathy or easy bruising All other systems  were reviewed with the patient and are negative.  MEDICAL HISTORY:  Past Medical History:  Diagnosis Date  . Compression fracture of lumbar vertebra ([place])   . Hypertension     SURGICAL HISTORY: Past Surgical History:  Procedure Laterality Date  . BACK SURGERY  2009   ruptured disc    I have reviewed the social history and family history with the patient and they are unchanged from previous note.  ALLERGIES:  has No Known Allergies.  MEDICATIONS:  Current Outpatient Medications  Medication Sig Dispense Refill  . amLODipine (NORVASC) 10 MG tablet Take 10 mg by mouth daily.    . cetirizine (ZYRTEC) 10 MG tablet Take 10 mg by mouth daily as needed for allergies.    . clotrimazole-betamethasone (LOTRISONE) cream Apply 1 application topically 2 (two) times daily. 45 g 1  . lisinopril-hydrochlorothiazide (PRINZIDE,ZESTORETIC) 10-12.5 MG tablet Take 1 tablet by mouth daily.    Marland Kitchen loperamide (IMODIUM) 2 MG capsule Take 4 mg by mouth as needed for diarrhea or loose stools.    . prochlorperazine (COMPAZINE) 5 MG tablet Take 1 tablet by mouth every 6 (six) hours as needed.  0  . spironolactone (ALDACTONE) 25 MG tablet Take 25 mg by mouth daily.    Marland Kitchen acetaminophen (TYLENOL) 500 MG tablet Take 1,000 mg by mouth every 6 (six) hours as needed for mild pain or moderate pain.    Marland Kitchen sucralfate (CARAFATE) 1 GM/10ML suspension Take 10 mLs (1 g total) by mouth 4 (four) times daily -  with meals and at bedtime. 420 mL 0   No current facility-administered medications for this visit.     PHYSICAL EXAMINATION: ECOG PERFORMANCE STATUS: 0 - Asymptomatic  Vitals:   09/17/17 1126  BP: 112/75  Pulse:  67  Resp: 18  Temp: 97.8 F (36.6 C)  SpO2: 100%   Filed Weights   09/17/17 1126  Weight: 167 lb 12.8 oz (76.1 kg)    GENERAL:alert, no distress and comfortable SKIN: skin color, texture, turgor are normal. Scattered faint pink raised rash to anterior chest  EYES: sclera clear OROPHARYNX:no thrush  or ulcers  LYMPH:  no palpable cervical or supraclavicular lymphadenopathy LUNGS: clear to auscultation with normal breathing effort HEART: regular rate & rhythm, no lower extremity edema ABDOMEN:abdomen soft, non-tender and normal bowel sounds Musculoskeletal:no cyanosis of digits and no clubbing  NEURO: alert & oriented x 3 with fluent speech  LABORATORY DATA:  I have reviewed the data as listed CBC Latest Ref Rng & Units 09/17/2017 09/03/2017 08/10/2017  WBC 3.9 - 10.3 K/uL 5.7 6.5 7.4  Hemoglobin 11.6 - 15.9 g/dL 10.9(L) 10.9(L) 9.8(L)  Hematocrit 34.8 - 46.6 % 39.0 33.2(L) 31.5(L)  Platelets 145 - 400 K/uL 227 367 286     CMP Latest Ref Rng & Units 09/17/2017 09/03/2017 08/10/2017  Glucose 70 - 99 mg/dL 91 96 103  BUN 8 - 23 mg/dL 22 26(H) 11  Creatinine 0.44 - 1.00 mg/dL 0.85 0.95 0.78  Sodium 135 - 145 mmol/L 140 143 142  Potassium 3.5 - 5.1 mmol/L 4.3 4.2 3.8  Chloride 98 - 111 mmol/L 107 107 106  CO2 22 - 32 mmol/L 25 26 24   Calcium 8.9 - 10.3 mg/dL 9.8 10.0 9.6  Total Protein 6.5 - 8.1 g/dL 7.9 8.1 7.7  Total Bilirubin 0.3 - 1.2 mg/dL 0.3 0.3 0.3  Alkaline Phos 38 - 126 U/L 143(H) 135(H) 139  AST 15 - 41 U/L 11(L) 10(L) 9  ALT 0 - 44 U/L 8 8 <6     RADIOGRAPHIC STUDIES: I have personally reviewed the radiological images as listed and agreed with the findings in the report. No results found.   ASSESSMENT & PLAN:  Malignant neoplasm of bronchus of upper lobe [facility]) Ms. Dershem appears stable. She is currently undergoing consolidation with durvalumab (imfinzi)10 mg/kg q2 weeks, s/p cycle 1. She tolerated well overall. She developed skin rash to anterior chest, she feels this is related to wearing perfume, jewelry, and a new outfit. While this is certainly reasonable, also potentially related to immunotherapy. I recommend she continue topical treatment with steroid cream and monitor for new or worsening rash. She knows to call us if she notices a change. Labs reviewed, adequate to  proceed with cycle 2 today. She will return for f/u with Dr. Julien Nordmann in 2 weeks before cycle 3. The plan was reviewed with Dr. Julien Nordmann.  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 I recommend she avoid alcohol. She treats symptomatically with imodium PRN. Her birthday is coming up and she plans to have a drink or 2, but none after that. I encouraged her to stay adequately hydrated in times of diarrhea.   All questions were answered. The patient knows to call the clinic with any problems, questions or concerns. No barriers to learning was detected.     Alla Feeling, NP 09/17/17

## 2017-09-17 ENCOUNTER — Inpatient Hospital Stay: Attending: Internal Medicine

## 2017-09-17 ENCOUNTER — Telehealth: Payer: Self-pay | Admitting: Nurse Practitioner

## 2017-09-17 ENCOUNTER — Encounter: Payer: Self-pay | Admitting: Nurse Practitioner

## 2017-09-17 ENCOUNTER — Inpatient Hospital Stay (HOSPITAL_BASED_OUTPATIENT_CLINIC_OR_DEPARTMENT_OTHER): Admitting: Nurse Practitioner

## 2017-09-17 ENCOUNTER — Inpatient Hospital Stay

## 2017-09-17 VITALS — BP 112/75 | HR 67 | Temp 97.8°F | Resp 18 | Ht 63.0 in | Wt 167.8 lb

## 2017-09-17 DIAGNOSIS — Z5112 Encounter for antineoplastic immunotherapy: Secondary | ICD-10-CM | POA: Diagnosis present

## 2017-09-17 DIAGNOSIS — R197 Diarrhea, unspecified: Secondary | ICD-10-CM

## 2017-09-17 DIAGNOSIS — C3411 Malignant neoplasm of upper lobe, right bronchus or lung: Secondary | ICD-10-CM | POA: Diagnosis not present

## 2017-09-17 DIAGNOSIS — R590 Localized enlarged lymph nodes: Secondary | ICD-10-CM | POA: Insufficient documentation

## 2017-09-17 DIAGNOSIS — R21 Rash and other nonspecific skin eruption: Secondary | ICD-10-CM

## 2017-09-17 DIAGNOSIS — Z79899 Other long term (current) drug therapy: Secondary | ICD-10-CM | POA: Diagnosis not present

## 2017-09-17 DIAGNOSIS — Z923 Personal history of irradiation: Secondary | ICD-10-CM | POA: Insufficient documentation

## 2017-09-17 DIAGNOSIS — Z9221 Personal history of antineoplastic chemotherapy: Secondary | ICD-10-CM | POA: Insufficient documentation

## 2017-09-17 DIAGNOSIS — I10 Essential (primary) hypertension: Secondary | ICD-10-CM | POA: Insufficient documentation

## 2017-09-17 DIAGNOSIS — C3490 Malignant neoplasm of unspecified part of unspecified bronchus or lung: Secondary | ICD-10-CM

## 2017-09-17 LAB — CMP (CANCER CENTER ONLY)
ALT: 8 U/L (ref 0–44)
AST: 11 U/L — AB (ref 15–41)
Albumin: 3.5 g/dL (ref 3.5–5.0)
Alkaline Phosphatase: 143 U/L — ABNORMAL HIGH (ref 38–126)
Anion gap: 8 (ref 5–15)
BILIRUBIN TOTAL: 0.3 mg/dL (ref 0.3–1.2)
BUN: 22 mg/dL (ref 8–23)
CHLORIDE: 107 mmol/L (ref 98–111)
CO2: 25 mmol/L (ref 22–32)
CREATININE: 0.85 mg/dL (ref 0.44–1.00)
Calcium: 9.8 mg/dL (ref 8.9–10.3)
GFR, Est AFR Am: >60 mL/min (ref >60)
Glucose, Bld: 91 mg/dL (ref 70–99)
POTASSIUM: 4.3 mmol/L (ref 3.5–5.1)
Sodium: 140 mmol/L (ref 135–145)
Total Protein: 7.9 g/dL (ref 6.5–8.1)

## 2017-09-17 LAB — CBC WITH DIFFERENTIAL (CANCER CENTER ONLY)
Basophils Absolute: 0.1 10*3/uL (ref 0.0–0.1)
Basophils Relative: 1 %
EOS ABS: 0.5 10*3/uL (ref 0.0–0.5)
EOS PCT: 8 %
HCT: 39 % (ref 34.8–46.6)
Hemoglobin: 10.9 g/dL — ABNORMAL LOW (ref 11.6–15.9)
LYMPHS ABS: 1.1 10*3/uL (ref 0.9–3.3)
LYMPHS PCT: 19 %
MCH: 29 pg (ref 25.1–34.0)
MCHC: 27.9 g/dL — AB (ref 31.5–36.0)
MCV: 103.7 fL — AB (ref 79.5–101.0)
MONO ABS: 0.4 10*3/uL (ref 0.1–0.9)
Monocytes Relative: 7 %
Neutro Abs: 3.7 10*3/uL (ref 1.5–6.5)
Neutrophils Relative %: 65 %
PLATELETS: 227 10*3/uL (ref 145–400)
RBC: 3.76 MIL/uL (ref 3.70–5.45)
RDW: 18.7 % — AB (ref 11.2–14.5)
WBC: 5.7 10*3/uL (ref 3.9–10.3)

## 2017-09-17 MED ORDER — DURVALUMAB 500 MG/10ML IV SOLN
10.0000 mg/kg | Freq: Once | INTRAVENOUS | Status: AC
  Administered 2017-09-17: 740 mg via INTRAVENOUS
  Filled 2017-09-17: qty 10

## 2017-09-17 MED ORDER — SODIUM CHLORIDE 0.9 % IV SOLN
Freq: Once | INTRAVENOUS | Status: AC
  Administered 2017-09-17: 12:00:00 via INTRAVENOUS
  Filled 2017-09-17: qty 250

## 2017-09-17 NOTE — Patient Instructions (Signed)
[facility] Discharge Instructions for Patients Receiving Chemotherapy  Today you received the following chemotherapy agents Imfinzi  To help prevent nausea and vomiting after your treatment, we encourage you to take your nausea medication as directed   If you develop nausea and vomiting that is not controlled by your nausea medication, call the clinic.   BELOW ARE SYMPTOMS THAT SHOULD BE REPORTED IMMEDIATELY:  *FEVER GREATER THAN 100.5 F  *CHILLS WITH OR WITHOUT FEVER  NAUSEA AND VOMITING THAT IS NOT CONTROLLED WITH YOUR NAUSEA MEDICATION  *UNUSUAL SHORTNESS OF BREATH  *UNUSUAL BRUISING OR BLEEDING  TENDERNESS IN MOUTH AND THROAT WITH OR WITHOUT PRESENCE OF ULCERS  *URINARY PROBLEMS  *BOWEL PROBLEMS  UNUSUAL RASH Items with * indicate a potential emergency and should be followed up as soon as possible.  Feel free to call the clinic should you have any questions or concerns. The clinic phone number is [phone].  Please show the [facility] at check-in to the Emergency Department and triage nurse.  Durvalumab injection What is this medicine? DURVALUMAB (dur VAL ue mab) is a monoclonal antibody. It is used to treat urothelial cancer. This medicine may be used for other purposes; ask your health care provider or pharmacist if you have questions. COMMON BRAND NAME(S): IMFINZI What should I tell my health care provider before I take this medicine? They need to know if you have any of these conditions: -diabetes -immune system problems -infection -inflammatory bowel disease -kidney disease -liver disease -lung or breathing disease -lupus -organ transplant -stomach or intestine problems -thyroid disease -an unusual or allergic reaction to durvalumab, other medicines, foods, dyes, or preservatives -pregnant or trying to get pregnant -breast-feeding How should I use this medicine? This medicine is for infusion into a vein. It is given by a  health care professional in a hospital or clinic setting. A special MedGuide will be given to you before each treatment. Be sure to read this information carefully each time. Talk to your pediatrician regarding the use of this medicine in children. Special care may be needed. Overdosage: If you think you have taken too much of this medicine contact a poison control center or emergency room at once. NOTE: This medicine is only for you. Do not share this medicine with others. What if I miss a dose? It is important not to miss your dose. Call your doctor or health care professional if you are unable to keep an appointment. What may interact with this medicine? Interactions have not been studied. This list may not describe all possible interactions. Give your health care provider a list of all the medicines, herbs, non-prescription drugs, or dietary supplements you use. Also tell them if you smoke, drink alcohol, or use illegal drugs. Some items may interact with your medicine. What should I watch for while using this medicine? This drug may make you feel generally unwell. Continue your course of treatment even though you feel ill unless your doctor tells you to stop. You may need blood work done while you are taking this medicine. Do not become pregnant while taking this medicine or for 3 months after stopping it. Women should inform their doctor if they wish to become pregnant or think they might be pregnant. There is a potential for serious side effects to an unborn child. Talk to your health care professional or pharmacist for more information. Do not breast-feed an infant while taking this medicine or for 3 months after stopping it. What side effects may  I notice from receiving this medicine? Side effects that you should report to your doctor or health care professional as soon as possible: -allergic reactions like skin rash, itching or hives, swelling of the face, lips, or tongue -black, tarry  stools -bloody or watery diarrhea -breathing problems -change in emotions or moods -change in sex drive -changes in vision -chest pain or chest tightness -chills -confusion -cough -facial flushing -fever -headache -signs and symptoms of high blood sugar such as dizziness; dry mouth; dry skin; fruity breath; nausea; stomach pain; increased hunger or thirst; increased urination -signs and symptoms of liver injury like dark yellow or brown urine; general ill feeling or flu-like symptoms; light-colored stools; loss of appetite; nausea; right upper belly pain; unusually weak or tired; yellowing of the eyes or skin -stomach pain -trouble passing urine or change in the amount of urine -weight gain or weight loss Side effects that usually do not require medical attention (report these to your doctor or health care professional if they continue or are bothersome): -bone pain -constipation -loss of appetite -muscle pain -nausea -swelling of the ankles, feet, hands -tiredness This list may not describe all possible side effects. Call your doctor for medical advice about side effects. You may report side effects to FDA at 1-800-FDA-1088. Where should I keep my medicine? This drug is given in a hospital or clinic and will not be stored at home. NOTE: This sheet is a summary. It may not cover all possible information. If you have questions about this medicine, talk to your doctor, pharmacist, or health care provider.  2018 Elsevier/Gold Standard (2015-09-07 15:50:36)

## 2017-09-17 NOTE — Telephone Encounter (Signed)
Scheduled apt per 8/1 los - gave patient AVS and calender per los.

## 2017-10-01 ENCOUNTER — Encounter: Payer: Self-pay | Admitting: Internal Medicine

## 2017-10-01 ENCOUNTER — Inpatient Hospital Stay

## 2017-10-01 ENCOUNTER — Telehealth: Payer: Self-pay | Admitting: Internal Medicine

## 2017-10-01 ENCOUNTER — Inpatient Hospital Stay (HOSPITAL_BASED_OUTPATIENT_CLINIC_OR_DEPARTMENT_OTHER): Admitting: Internal Medicine

## 2017-10-01 VITALS — BP 120/82 | HR 74 | Temp 98.0°F | Resp 18 | Ht 63.0 in | Wt 169.7 lb

## 2017-10-01 DIAGNOSIS — C3411 Malignant neoplasm of upper lobe, right bronchus or lung: Secondary | ICD-10-CM | POA: Diagnosis not present

## 2017-10-01 DIAGNOSIS — Z923 Personal history of irradiation: Secondary | ICD-10-CM | POA: Diagnosis not present

## 2017-10-01 DIAGNOSIS — Z79899 Other long term (current) drug therapy: Secondary | ICD-10-CM

## 2017-10-01 DIAGNOSIS — Z5112 Encounter for antineoplastic immunotherapy: Secondary | ICD-10-CM | POA: Diagnosis not present

## 2017-10-01 DIAGNOSIS — I10 Essential (primary) hypertension: Secondary | ICD-10-CM

## 2017-10-01 DIAGNOSIS — Z9221 Personal history of antineoplastic chemotherapy: Secondary | ICD-10-CM

## 2017-10-01 DIAGNOSIS — C3490 Malignant neoplasm of unspecified part of unspecified bronchus or lung: Secondary | ICD-10-CM

## 2017-10-01 LAB — CBC WITH DIFFERENTIAL (CANCER CENTER ONLY)
Basophils Absolute: 0 10*3/uL (ref 0.0–0.1)
Basophils Relative: 0 %
Eosinophils Absolute: 0.4 10*3/uL (ref 0.0–0.5)
Eosinophils Relative: 6 %
HCT: 35.7 % (ref 34.8–46.6)
Hemoglobin: 11.3 g/dL — ABNORMAL LOW (ref 11.6–15.9)
Lymphocytes Relative: 16 %
Lymphs Abs: 1.1 10*3/uL (ref 0.9–3.3)
MCH: 28.5 pg (ref 25.1–34.0)
MCHC: 31.7 g/dL (ref 31.5–36.0)
MCV: 90.2 fL (ref 79.5–101.0)
Monocytes Absolute: 0.4 10*3/uL (ref 0.1–0.9)
Monocytes Relative: 6 %
Neutro Abs: 5 10*3/uL (ref 1.5–6.5)
Neutrophils Relative %: 72 %
Platelet Count: 272 10*3/uL (ref 145–400)
RBC: 3.96 MIL/uL (ref 3.70–5.45)
RDW: 15 % — ABNORMAL HIGH (ref 11.2–14.5)
WBC Count: 6.9 10*3/uL (ref 3.9–10.3)

## 2017-10-01 LAB — CMP (CANCER CENTER ONLY)
ALT: 6 U/L (ref 0–44)
AST: 11 U/L — ABNORMAL LOW (ref 15–41)
Albumin: 3.4 g/dL — ABNORMAL LOW (ref 3.5–5.0)
Alkaline Phosphatase: 123 U/L (ref 38–126)
Anion gap: 9 (ref 5–15)
BUN: 20 mg/dL (ref 8–23)
CHLORIDE: 110 mmol/L (ref 98–111)
CO2: 25 mmol/L (ref 22–32)
Calcium: 9.6 mg/dL (ref 8.9–10.3)
Creatinine: 0.87 mg/dL (ref 0.44–1.00)
GFR, Estimated: >60 mL/min (ref >60)
Glucose, Bld: 92 mg/dL (ref 70–99)
POTASSIUM: 4.4 mmol/L (ref 3.5–5.1)
SODIUM: 144 mmol/L (ref 135–145)
Total Bilirubin: 0.5 mg/dL (ref 0.3–1.2)
Total Protein: 7.5 g/dL (ref 6.5–8.1)

## 2017-10-01 LAB — TSH

## 2017-10-01 MED ORDER — DURVALUMAB 500 MG/10ML IV SOLN
10.0000 mg/kg | Freq: Once | INTRAVENOUS | Status: AC
  Administered 2017-10-01: 740 mg via INTRAVENOUS
  Filled 2017-10-01: qty 10

## 2017-10-01 MED ORDER — SODIUM CHLORIDE 0.9 % IV SOLN
Freq: Once | INTRAVENOUS | Status: AC
  Administered 2017-10-01: 13:00:00 via INTRAVENOUS
  Filled 2017-10-01: qty 250

## 2017-10-01 NOTE — Progress Notes (Signed)
[facility] [phone]   [phone]  OFFICE PROGRESS NOTE  [name], MD [address] [place] [address]  DIAGNOSIS: stageIIIB (T3, N2, M0)non-small cell lung cancer, adenocarcinoma diagnosed in March 2019 and presented with large right upper lobe lung mass in addition to right hilar and mediastinal lymphadenopathy.  PD-L1 0%  PRIOR THERAPY: Concurrent chemoradiation with weekly carboplatin for an AUC of 2 and paclitaxel 45 mg/m2.  First dose expected on 06/01/2017.  Status post 7 cycles.  Last dose was giving 07/14/2017.  CURRENT THERAPY:  Consolidation treatment with immunotherapy with Imfinzi (Durvalumab) 10 mg/KG every 2 weeks status post 2 cycles..  INTERVAL HISTORY: Ashley Wilson 62 y.o. female returns to the clinic today for follow-up visit accompanied by her granddaughter.  The patient is feeling fine today with no concerning complaints.  She is tolerating her current treatment with immunotherapy fairly well.  She denied having any skin rash or diarrhea.  She has no nausea, vomiting or constipation.  She denied having any significant chest pain, shortness of breath, cough or hemoptysis.  The patient denied having any recent weight loss or night sweats.  She is here today for evaluation before starting cycle #3.  MEDICAL HISTORY: Past Medical History:  Diagnosis Date  . Compression fracture of lumbar vertebra ([place])   . Hypertension     ALLERGIES:  has No Known Allergies.  MEDICATIONS:  Current Outpatient Medications  Medication Sig Dispense Refill  . acetaminophen (TYLENOL) 500 MG tablet Take 1,000 mg by mouth every 6 (six) hours as needed for mild pain or moderate pain.    Marland Kitchen amLODipine (NORVASC) 10 MG tablet Take 10 mg by mouth daily.    . cetirizine (ZYRTEC) 10 MG tablet Take 10 mg by mouth daily as needed for allergies.    . clotrimazole-betamethasone (LOTRISONE) cream Apply 1 application topically 2 (two) times  daily. 45 g 1  . lisinopril-hydrochlorothiazide (PRINZIDE,ZESTORETIC) 10-12.5 MG tablet Take 1 tablet by mouth daily.    Marland Kitchen loperamide (IMODIUM) 2 MG capsule Take 4 mg by mouth as needed for diarrhea or loose stools.    . prochlorperazine (COMPAZINE) 5 MG tablet Take 1 tablet by mouth every 6 (six) hours as needed.  0  . spironolactone (ALDACTONE) 25 MG tablet Take 25 mg by mouth daily.    . sucralfate (CARAFATE) 1 GM/10ML suspension Take 10 mLs (1 g total) by mouth 4 (four) times daily -  with meals and at bedtime. 420 mL 0   No current facility-administered medications for this visit.     SURGICAL HISTORY:  Past Surgical History:  Procedure Laterality Date  . BACK SURGERY  2009   ruptured disc    REVIEW OF SYSTEMS:  A comprehensive review of systems was negative.   PHYSICAL EXAMINATION: General appearance: alert, cooperative and no distress Head: Normocephalic, without obvious abnormality, atraumatic Neck: no adenopathy, no JVD, supple, symmetrical, trachea midline and thyroid not enlarged, symmetric, no tenderness/mass/nodules Lymph nodes: Cervical, supraclavicular, and axillary nodes normal. Resp: clear to auscultation bilaterally Back: symmetric, no curvature. ROM normal. No CVA tenderness. Cardio: regular rate and rhythm, S1, S2 normal, no murmur, click, rub or gallop GI: soft, non-tender; bowel sounds normal; no masses,  no organomegaly Extremities: extremities normal, atraumatic, no cyanosis or edema  ECOG PERFORMANCE STATUS: 1 - Symptomatic but completely ambulatory  Blood pressure 120/82, pulse 74, temperature 98 F (36.7 C), temperature source Oral, resp. rate 18, height _0  (  1.6 m), weight 169 lb 11.2 oz (77 kg), SpO2 100 %.  LABORATORY DATA: Lab Results  Component Value Date   WBC 6.9 10/01/2017   HGB 11.3 (L) 10/01/2017   HCT 35.7 10/01/2017   MCV 90.2 10/01/2017   PLT 272 10/01/2017      Chemistry      Component Value Date/Time   NA 144 10/01/2017 1111     K 4.4 10/01/2017 1111   CL 110 10/01/2017 1111   CO2 25 10/01/2017 1111   BUN 20 10/01/2017 1111   CREATININE 0.87 10/01/2017 1111      Component Value Date/Time   CALCIUM 9.6 10/01/2017 1111   ALKPHOS 123 10/01/2017 1111   AST 11 (L) 10/01/2017 1111   ALT 6 10/01/2017 1111   BILITOT 0.5 10/01/2017 1111       RADIOGRAPHIC STUDIES: No results found.  ASSESSMENT AND PLAN: This is a very pleasant 62 years old [name] with stage IIIB non-small cell lung cancer, adenocarcinoma.   The patient underwent a course of concurrent chemoradiation with weekly carboplatin and paclitaxel status post 7 cycles.  She tolerated this course of treatment fairly well with no concerning complaints.   The patient is currently undergoing treatment with consolidation immunotherapy with Imfinzi (Durvalumab) 10 mg/KG every 2 weeks status post 2 cycles. She continues to tolerate this treatment well. I recommended for her to proceed with cycle #3 today as a schedule. She will come back for follow-up visit in 2 weeks for evaluation before the next cycle of her treatment. The patient was advised to call immediately if she has any concerning symptoms in the interval. The patient voices understanding of current disease status and treatment options and is in agreement with the current care plan. All questions were answered. The patient knows to call the clinic with any problems, questions or concerns. We can certainly see the patient much sooner if necessary.  I spent 10 minutes counseling the patient face to face. The total time spent in the appointment was 15 minutes.  Disclaimer: This note was dictated with voice recognition software. Similar sounding words can inadvertently be transcribed and may not be corrected upon review.

## 2017-10-01 NOTE — Patient Instructions (Signed)
[facility] Discharge Instructions for Patients Receiving Chemotherapy  Today you received the following chemotherapy agents: Imfinzi.  To help prevent nausea and vomiting after your treatment, we encourage you to take your nausea medication as directed.   If you develop nausea and vomiting that is not controlled by your nausea medication, call the clinic.   BELOW ARE SYMPTOMS THAT SHOULD BE REPORTED IMMEDIATELY:  *FEVER GREATER THAN 100.5 F  *CHILLS WITH OR WITHOUT FEVER  NAUSEA AND VOMITING THAT IS NOT CONTROLLED WITH YOUR NAUSEA MEDICATION  *UNUSUAL SHORTNESS OF BREATH  *UNUSUAL BRUISING OR BLEEDING  TENDERNESS IN MOUTH AND THROAT WITH OR WITHOUT PRESENCE OF ULCERS  *URINARY PROBLEMS  *BOWEL PROBLEMS  UNUSUAL RASH Items with * indicate a potential emergency and should be followed up as soon as possible.  Feel free to call the clinic should you have any questions or concerns. The clinic phone number is [phone].  Please show the CHEMO ALERT CARD at check-in to the Emergency Department and triage nurse.   

## 2017-10-01 NOTE — Telephone Encounter (Signed)
3 cycles already scheduled per 8/15 los.

## 2017-10-15 ENCOUNTER — Inpatient Hospital Stay

## 2017-10-15 ENCOUNTER — Inpatient Hospital Stay (HOSPITAL_BASED_OUTPATIENT_CLINIC_OR_DEPARTMENT_OTHER): Admitting: Oncology

## 2017-10-15 ENCOUNTER — Other Ambulatory Visit: Payer: Self-pay

## 2017-10-15 ENCOUNTER — Encounter: Payer: Self-pay | Admitting: Oncology

## 2017-10-15 VITALS — BP 115/62 | HR 91 | Temp 97.9°F | Resp 18 | Ht 63.0 in | Wt 166.7 lb

## 2017-10-15 DIAGNOSIS — Z923 Personal history of irradiation: Secondary | ICD-10-CM | POA: Diagnosis not present

## 2017-10-15 DIAGNOSIS — I10 Essential (primary) hypertension: Secondary | ICD-10-CM | POA: Diagnosis not present

## 2017-10-15 DIAGNOSIS — Z9221 Personal history of antineoplastic chemotherapy: Secondary | ICD-10-CM

## 2017-10-15 DIAGNOSIS — Z5112 Encounter for antineoplastic immunotherapy: Secondary | ICD-10-CM | POA: Diagnosis not present

## 2017-10-15 DIAGNOSIS — C3411 Malignant neoplasm of upper lobe, right bronchus or lung: Secondary | ICD-10-CM

## 2017-10-15 DIAGNOSIS — Z79899 Other long term (current) drug therapy: Secondary | ICD-10-CM

## 2017-10-15 DIAGNOSIS — C3490 Malignant neoplasm of unspecified part of unspecified bronchus or lung: Secondary | ICD-10-CM

## 2017-10-15 LAB — CMP (CANCER CENTER ONLY)
ALBUMIN: 3.4 g/dL — AB (ref 3.5–5.0)
ALT: 8 U/L (ref 0–44)
AST: 13 U/L — AB (ref 15–41)
Alkaline Phosphatase: 113 U/L (ref 38–126)
Anion gap: 10 (ref 5–15)
BUN: 33 mg/dL — AB (ref 8–23)
CHLORIDE: 108 mmol/L (ref 98–111)
CO2: 23 mmol/L (ref 22–32)
Calcium: 10.4 mg/dL — ABNORMAL HIGH (ref 8.9–10.3)
Creatinine: 0.97 mg/dL (ref 0.44–1.00)
GFR, Est AFR Am: >60 mL/min (ref >60)
GFR, Estimated: >60 mL/min (ref >60)
GLUCOSE: 96 mg/dL (ref 70–99)
POTASSIUM: 4.3 mmol/L (ref 3.5–5.1)
Sodium: 141 mmol/L (ref 135–145)
Total Bilirubin: 0.4 mg/dL (ref 0.3–1.2)
Total Protein: 7.8 g/dL (ref 6.5–8.1)

## 2017-10-15 LAB — CBC WITH DIFFERENTIAL (CANCER CENTER ONLY)
BASOS ABS: 0 10*3/uL (ref 0.0–0.1)
Basophils Relative: 1 %
Eosinophils Absolute: 0.2 10*3/uL (ref 0.0–0.5)
Eosinophils Relative: 3 %
HEMATOCRIT: 35.8 % (ref 34.8–46.6)
HEMOGLOBIN: 11.7 g/dL (ref 11.6–15.9)
LYMPHS PCT: 12 %
Lymphs Abs: 0.8 10*3/uL — ABNORMAL LOW (ref 0.9–3.3)
MCH: 28.3 pg (ref 25.1–34.0)
MCHC: 32.7 g/dL (ref 31.5–36.0)
MCV: 86.7 fL (ref 79.5–101.0)
MONOS PCT: 11 %
Monocytes Absolute: 0.6 10*3/uL (ref 0.1–0.9)
NEUTROS ABS: 4.5 10*3/uL (ref 1.5–6.5)
Neutrophils Relative %: 73 %
Platelet Count: 277 10*3/uL (ref 145–400)
RBC: 4.13 MIL/uL (ref 3.70–5.45)
RDW: 14.1 % (ref 11.2–14.5)
WBC: 6.1 10*3/uL (ref 3.9–10.3)

## 2017-10-15 MED ORDER — SODIUM CHLORIDE 0.9 % IV SOLN
Freq: Once | INTRAVENOUS | Status: AC
  Administered 2017-10-15: 12:00:00 via INTRAVENOUS
  Filled 2017-10-15: qty 250

## 2017-10-15 MED ORDER — SODIUM CHLORIDE 0.9 % IV SOLN
10.0000 mg/kg | Freq: Once | INTRAVENOUS | Status: AC
  Administered 2017-10-15: 740 mg via INTRAVENOUS
  Filled 2017-10-15: qty 4.8

## 2017-10-15 NOTE — Assessment & Plan Note (Addendum)
This is a very pleasant 62 year old [name] with stage IIIB non-small cell lung cancer, adenocarcinoma.   The patient underwent a course of concurrent chemoradiation with weekly carboplatin and paclitaxel status post 7 cycles.  She tolerated this course of treatment fairly well with no concerning complaints.   The patient is currently undergoing treatment with consolidation immunotherapy with Imfinzi (Durvalumab) 10 mg/KG every 2 weeks status post 3 cycles. She continues to tolerate this treatment well. I recommended for her to proceed with cycle #4 today as a scheduled. She will come back for follow-up visit in 2 weeks for evaluation before the next cycle of her treatment.  The patient was advised to call immediately if she has any concerning symptoms in the interval. The patient voices understanding of current disease status and treatment options and is in agreement with the current care plan. All questions were answered. The patient knows to call the clinic with any problems, questions or concerns. We can certainly see the patient much sooner if necessary.

## 2017-10-15 NOTE — Patient Instructions (Signed)
[facility] Discharge Instructions for Patients Receiving Chemotherapy  Today you received the following chemotherapy agents Imfinzi  To help prevent nausea and vomiting after your treatment, we encourage you to take your nausea medication as directed   If you develop nausea and vomiting that is not controlled by your nausea medication, call the clinic.   BELOW ARE SYMPTOMS THAT SHOULD BE REPORTED IMMEDIATELY:  *FEVER GREATER THAN 100.5 F  *CHILLS WITH OR WITHOUT FEVER  NAUSEA AND VOMITING THAT IS NOT CONTROLLED WITH YOUR NAUSEA MEDICATION  *UNUSUAL SHORTNESS OF BREATH  *UNUSUAL BRUISING OR BLEEDING  TENDERNESS IN MOUTH AND THROAT WITH OR WITHOUT PRESENCE OF ULCERS  *URINARY PROBLEMS  *BOWEL PROBLEMS  UNUSUAL RASH Items with * indicate a potential emergency and should be followed up as soon as possible.  Feel free to call the clinic should you have any questions or concerns. The clinic phone number is [phone].  Please show the [place] at check-in to the Emergency Department and triage nurse.  Durvalumab injection What is this medicine? DURVALUMAB (dur VAL ue mab) is a monoclonal antibody. It is used to treat urothelial cancer. This medicine may be used for other purposes; ask your health care provider or pharmacist if you have questions. COMMON BRAND NAME(S): IMFINZI What should I tell my health care provider before I take this medicine? They need to know if you have any of these conditions: -diabetes -immune system problems -infection -inflammatory bowel disease -kidney disease -liver disease -lung or breathing disease -lupus -organ transplant -stomach or intestine problems -thyroid disease -an unusual or allergic reaction to durvalumab, other medicines, foods, dyes, or preservatives -pregnant or trying to get pregnant -breast-feeding How should I use this medicine? This medicine is for infusion into a vein. It is given by a  health care professional in a hospital or clinic setting. A special MedGuide will be given to you before each treatment. Be sure to read this information carefully each time. Talk to your pediatrician regarding the use of this medicine in children. Special care may be needed. Overdosage: If you think you have taken too much of this medicine contact a poison control center or emergency room at once. NOTE: This medicine is only for you. Do not share this medicine with others. What if I miss a dose? It is important not to miss your dose. Call your doctor or health care professional if you are unable to keep an appointment. What may interact with this medicine? Interactions have not been studied. This list may not describe all possible interactions. Give your health care provider a list of all the medicines, herbs, non-prescription drugs, or dietary supplements you use. Also tell them if you smoke, drink alcohol, or use illegal drugs. Some items may interact with your medicine. What should I watch for while using this medicine? This drug may make you feel generally unwell. Continue your course of treatment even though you feel ill unless your doctor tells you to stop. You may need blood work done while you are taking this medicine. Do not become pregnant while taking this medicine or for 3 months after stopping it. Women should inform their doctor if they wish to become pregnant or think they might be pregnant. There is a potential for serious side effects to an unborn child. Talk to your health care professional or pharmacist for more information. Do not breast-feed an infant while taking this medicine or for 3 months after stopping it. What side effects may  I notice from receiving this medicine? Side effects that you should report to your doctor or health care professional as soon as possible: -allergic reactions like skin rash, itching or hives, swelling of the face, lips, or tongue -black, tarry  stools -bloody or watery diarrhea -breathing problems -change in emotions or moods -change in sex drive -changes in vision -chest pain or chest tightness -chills -confusion -cough -facial flushing -fever -headache -signs and symptoms of high blood sugar such as dizziness; dry mouth; dry skin; fruity breath; nausea; stomach pain; increased hunger or thirst; increased urination -signs and symptoms of liver injury like dark yellow or brown urine; general ill feeling or flu-like symptoms; light-colored stools; loss of appetite; nausea; right upper belly pain; unusually weak or tired; yellowing of the eyes or skin -stomach pain -trouble passing urine or change in the amount of urine -weight gain or weight loss Side effects that usually do not require medical attention (report these to your doctor or health care professional if they continue or are bothersome): -bone pain -constipation -loss of appetite -muscle pain -nausea -swelling of the ankles, feet, hands -tiredness This list may not describe all possible side effects. Call your doctor for medical advice about side effects. You may report side effects to FDA at 1-800-FDA-1088. Where should I keep my medicine? This drug is given in a hospital or clinic and will not be stored at home. NOTE: This sheet is a summary. It may not cover all possible information. If you have questions about this medicine, talk to your doctor, pharmacist, or health care provider.  2018 Elsevier/Gold Standard (2015-09-07 15:50:36)

## 2017-10-15 NOTE — Progress Notes (Signed)
[facility] OFFICE PROGRESS NOTE  [name], [name]  [address]  DIAGNOSIS:stageIIIB (T3, N2, M0)non-small cell lung cancer, adenocarcinoma diagnosed in March 2019 and presented with large right upper lobe lung mass in addition to right hilar and mediastinal lymphadenopathy.  PD-L10%  PRIOR THERAPY:Concurrent chemoradiation with weekly carboplatin for an AUC of 2 and paclitaxel 45 mg/m2. First dose expected on 06/01/2017.  Status post 7 cycles.  Last dose was giving 07/14/2017.  CURRENT THERAPY: Consolidation treatment with immunotherapy with Imfinzi (Durvalumab) 10 mg/KG every 2 weeks status post 3 cycles.  INTERVAL HISTORY: Ashley Wilson 62 y.o. female returns for a routine follow-up visit by herself.  The patient is feeling fine today and has no specific complaints.  She is tolerating her treatment with immunotherapy fairly well.  The patient denies fevers and chills.  Denies chest pain, shortness of breath, cough, hemoptysis.  Denies nausea, vomiting, constipation, diarrhea.  Denies recent weight loss or night sweats.  The patient is here for evaluation prior to starting cycle #4 of her treatment.  MEDICAL HISTORY: Past Medical History:  Diagnosis Date  . Compression fracture of lumbar vertebra ([place])   . Hypertension     ALLERGIES:  has No Known Allergies.  MEDICATIONS:  Current Outpatient Medications  Medication Sig Dispense Refill  . amLODipine (NORVASC) 10 MG tablet Take 10 mg by mouth daily.    . clotrimazole-betamethasone (LOTRISONE) cream Apply 1 application topically 2 (two) times daily. 45 g 1  . lisinopril-hydrochlorothiazide (PRINZIDE,ZESTORETIC) 10-12.5 MG tablet Take 1 tablet by mouth daily.    Marland Kitchen spironolactone (ALDACTONE) 25 MG tablet Take 25 mg by mouth daily.    Marland Kitchen acetaminophen (TYLENOL) 500 MG tablet Take 1,000 mg by mouth every 6 (six) hours as needed for mild pain or moderate pain.    Marland Kitchen loperamide (IMODIUM) 2 MG  capsule Take 4 mg by mouth as needed for diarrhea or loose stools.    . prochlorperazine (COMPAZINE) 5 MG tablet Take 1 tablet by mouth every 6 (six) hours as needed.  0  . sucralfate (CARAFATE) 1 GM/10ML suspension Take 10 mLs (1 g total) by mouth 4 (four) times daily -  with meals and at bedtime. (Patient not taking: Reported on 10/15/2017) 420 mL 0   No current facility-administered medications for this visit.    Facility-Administered Medications Ordered in Other Visits  Medication Dose Route Frequency Provider Last Rate Last Dose  . durvalumab (IMFINZI) 740 mg in sodium chloride 0.9 % 100 mL chemo infusion  10 mg/kg (Treatment Plan Recorded) Intravenous Once Curt Bears, MD        SURGICAL HISTORY:  Past Surgical History:  Procedure Laterality Date  . BACK SURGERY  2009   ruptured disc    REVIEW OF SYSTEMS:   Review of Systems  Constitutional: Negative for appetite change, chills, fatigue, fever and unexpected weight change.  HENT:   Negative for mouth sores, nosebleeds, sore throat and trouble swallowing.   Eyes: Negative for eye problems and icterus.  Respiratory: Negative for cough, hemoptysis, shortness of breath and wheezing.   Cardiovascular: Negative for chest pain and leg swelling.  Gastrointestinal: Negative for abdominal pain, constipation, diarrhea, nausea and vomiting.  Genitourinary: Negative for bladder incontinence, difficulty urinating, dysuria, frequency and hematuria.   Musculoskeletal: Negative for back pain, gait problem, neck pain and neck stiffness.  Skin: Negative for itching and rash.  Neurological: Negative for dizziness, extremity weakness, gait problem, headaches, light-headedness and seizures.  Hematological: Negative for adenopathy. Does not bruise/bleed easily.  Psychiatric/Behavioral: Negative for confusion, depression and sleep disturbance. The patient is not nervous/anxious.     PHYSICAL EXAMINATION:  Blood pressure 115/62, pulse 91,  temperature 97.9 F (36.6 C), temperature source Oral, resp. rate 18, height 5' 3"  (1.6 m), weight 166 lb 11.2 oz (75.6 kg), SpO2 100 %.  ECOG PERFORMANCE STATUS: 1 - Symptomatic but completely ambulatory  Physical Exam  Constitutional: Oriented to person, place, and time and well-developed, well-nourished, and in no distress. No distress.  HENT:  Head: Normocephalic and atraumatic.  Mouth/Throat: Oropharynx is clear and moist. No oropharyngeal exudate.  Eyes: Conjunctivae are normal. Right eye exhibits no discharge. Left eye exhibits no discharge. No scleral icterus.  Neck: Normal range of motion. Neck supple.  Cardiovascular: Normal rate, regular rhythm, normal heart sounds and intact distal pulses.   Pulmonary/Chest: Effort normal and breath sounds normal. No respiratory distress. No wheezes. No rales.  Abdominal: Soft. Bowel sounds are normal. Exhibits no distension and no mass. There is no tenderness.  Musculoskeletal: Normal range of motion. Exhibits no edema.  Lymphadenopathy:    No cervical adenopathy.  Neurological: Alert and oriented to person, place, and time. Exhibits normal muscle tone. Gait normal. Coordination normal.  Skin: Skin is warm and dry. No rash noted. Not diaphoretic. No erythema. No pallor.  Psychiatric: Mood, memory and judgment normal.  Vitals reviewed.  LABORATORY DATA: Lab Results  Component Value Date   WBC 6.1 10/15/2017   HGB 11.7 10/15/2017   HCT 35.8 10/15/2017   MCV 86.7 10/15/2017   PLT 277 10/15/2017      Chemistry      Component Value Date/Time   NA 141 10/15/2017 1033   K 4.3 10/15/2017 1033   CL 108 10/15/2017 1033   CO2 23 10/15/2017 1033   BUN 33 (H) 10/15/2017 1033   CREATININE 0.97 10/15/2017 1033      Component Value Date/Time   CALCIUM 10.4 (H) 10/15/2017 1033   ALKPHOS 113 10/15/2017 1033   AST 13 (L) 10/15/2017 1033   ALT 8 10/15/2017 1033   BILITOT 0.4 10/15/2017 1033       RADIOGRAPHIC STUDIES:  No results  found.   ASSESSMENT/PLAN:  Malignant neoplasm of bronchus of right upper lobe [facility]) This is a very pleasant 62 year old [name] with stage IIIB non-small cell lung cancer, adenocarcinoma.   The patient underwent a course of concurrent chemoradiation with weekly carboplatin and paclitaxel status post 7 cycles.  She tolerated this course of treatment fairly well with no concerning complaints.   The patient is currently undergoing treatment with consolidation immunotherapy with Imfinzi (Durvalumab) 10 mg/KG every 2 weeks status post 3 cycles. She continues to tolerate this treatment well. I recommended for her to proceed with cycle #4 today as a scheduled. She will come back for follow-up visit in 2 weeks for evaluation before the next cycle of her treatment.  The patient was advised to call immediately if she has any concerning symptoms in the interval. The patient voices understanding of current disease status and treatment options and is in agreement with the current care plan. All questions were answered. The patient knows to call the clinic with any problems, questions or concerns. We can certainly see the patient much sooner if necessary.   No orders of the defined types were placed in this encounter.    Mikey Bussing, DNP, AGPCNP-BC, AOCNP 10/15/17

## 2017-10-16 ENCOUNTER — Telehealth: Payer: Self-pay | Admitting: Oncology

## 2017-10-16 NOTE — Telephone Encounter (Signed)
Scheduled appt per 8/29 los - pt to get an updated schedule next visit.

## 2017-10-29 ENCOUNTER — Encounter: Payer: Self-pay | Admitting: Internal Medicine

## 2017-10-29 ENCOUNTER — Inpatient Hospital Stay

## 2017-10-29 ENCOUNTER — Inpatient Hospital Stay: Attending: Internal Medicine

## 2017-10-29 ENCOUNTER — Telehealth: Payer: Self-pay | Admitting: Oncology

## 2017-10-29 ENCOUNTER — Inpatient Hospital Stay (HOSPITAL_BASED_OUTPATIENT_CLINIC_OR_DEPARTMENT_OTHER): Admitting: Internal Medicine

## 2017-10-29 VITALS — BP 115/50 | HR 81 | Temp 98.2°F | Resp 18 | Ht 63.0 in | Wt 166.9 lb

## 2017-10-29 DIAGNOSIS — Z5112 Encounter for antineoplastic immunotherapy: Secondary | ICD-10-CM | POA: Diagnosis present

## 2017-10-29 DIAGNOSIS — Z79899 Other long term (current) drug therapy: Secondary | ICD-10-CM | POA: Diagnosis not present

## 2017-10-29 DIAGNOSIS — Z923 Personal history of irradiation: Secondary | ICD-10-CM

## 2017-10-29 DIAGNOSIS — C3411 Malignant neoplasm of upper lobe, right bronchus or lung: Secondary | ICD-10-CM

## 2017-10-29 DIAGNOSIS — C3490 Malignant neoplasm of unspecified part of unspecified bronchus or lung: Secondary | ICD-10-CM

## 2017-10-29 DIAGNOSIS — Z9221 Personal history of antineoplastic chemotherapy: Secondary | ICD-10-CM | POA: Insufficient documentation

## 2017-10-29 DIAGNOSIS — I10 Essential (primary) hypertension: Secondary | ICD-10-CM | POA: Insufficient documentation

## 2017-10-29 LAB — CBC WITH DIFFERENTIAL (CANCER CENTER ONLY)
BASOS ABS: 0 10*3/uL (ref 0.0–0.1)
BASOS PCT: 0 %
EOS ABS: 0.2 10*3/uL (ref 0.0–0.5)
EOS PCT: 3 %
HCT: 33.7 % — ABNORMAL LOW (ref 34.8–46.6)
Hemoglobin: 10.9 g/dL — ABNORMAL LOW (ref 11.6–15.9)
Lymphocytes Relative: 16 %
Lymphs Abs: 1 10*3/uL (ref 0.9–3.3)
MCH: 28.2 pg (ref 25.1–34.0)
MCHC: 32.3 g/dL (ref 31.5–36.0)
MCV: 87.3 fL (ref 79.5–101.0)
MONO ABS: 0.8 10*3/uL (ref 0.1–0.9)
MONOS PCT: 12 %
Neutro Abs: 4.4 10*3/uL (ref 1.5–6.5)
Neutrophils Relative %: 69 %
PLATELETS: 262 10*3/uL (ref 145–400)
RBC: 3.86 MIL/uL (ref 3.70–5.45)
RDW: 13.6 % (ref 11.2–14.5)
WBC Count: 6.4 10*3/uL (ref 3.9–10.3)

## 2017-10-29 LAB — CMP (CANCER CENTER ONLY)
ALT: 7 U/L (ref 0–44)
AST: 11 U/L — ABNORMAL LOW (ref 15–41)
Albumin: 3.4 g/dL — ABNORMAL LOW (ref 3.5–5.0)
Alkaline Phosphatase: 95 U/L (ref 38–126)
Anion gap: 9 (ref 5–15)
BILIRUBIN TOTAL: 0.3 mg/dL (ref 0.3–1.2)
BUN: 21 mg/dL (ref 8–23)
CALCIUM: 10.2 mg/dL (ref 8.9–10.3)
CHLORIDE: 109 mmol/L (ref 98–111)
CO2: 24 mmol/L (ref 22–32)
Creatinine: 0.97 mg/dL (ref 0.44–1.00)
GFR, Estimated: >60 mL/min (ref >60)
Glucose, Bld: 94 mg/dL (ref 70–99)
POTASSIUM: 4.5 mmol/L (ref 3.5–5.1)
Sodium: 142 mmol/L (ref 135–145)
TOTAL PROTEIN: 7.5 g/dL (ref 6.5–8.1)

## 2017-10-29 LAB — TSH

## 2017-10-29 MED ORDER — SODIUM CHLORIDE 0.9 % IV SOLN
10.0000 mg/kg | Freq: Once | INTRAVENOUS | Status: AC
  Administered 2017-10-29: 740 mg via INTRAVENOUS
  Filled 2017-10-29: qty 10

## 2017-10-29 MED ORDER — SODIUM CHLORIDE 0.9 % IV SOLN
Freq: Once | INTRAVENOUS | Status: AC
  Administered 2017-10-29: 10:00:00 via INTRAVENOUS
  Filled 2017-10-29: qty 250

## 2017-10-29 NOTE — Progress Notes (Signed)
[facility] [phone]   [phone]  OFFICE PROGRESS NOTE  [name], MD [address] [place] [address]  DIAGNOSIS: stageIIIB (T3, N2, M0)non-small cell lung cancer, adenocarcinoma diagnosed in March 2019 and presented with large right upper lobe lung mass in addition to right hilar and mediastinal lymphadenopathy.  PD-L1 0%  PRIOR THERAPY: Concurrent chemoradiation with weekly carboplatin for an AUC of 2 and paclitaxel 45 mg/m2.  First dose expected on 06/01/2017.  Status post 7 cycles.  Last dose was giving 07/14/2017.  CURRENT THERAPY:  Consolidation treatment with immunotherapy with Imfinzi (Durvalumab) 10 mg/KG every 2 weeks status post 4 cycles..  INTERVAL HISTORY: Ashley Wilson 62 y.o. female returns to the clinic today for follow-up visit.  The patient is feeling fine today with no concerning complaints.  She continues to tolerate her treatment with Imfinzi (Durvalumab) fairly well.  She denied having any skin rash or diarrhea.  She has no chest pain, shortness of breath, cough or hemoptysis.  She denied having any recent weight loss or night sweats.  She has no fever or chills.  She is here today for evaluation before starting cycle #5 of her treatment.  MEDICAL HISTORY: Past Medical History:  Diagnosis Date  . Compression fracture of lumbar vertebra ([place])   . Hypertension     ALLERGIES:  has No Known Allergies.  MEDICATIONS:  Current Outpatient Medications  Medication Sig Dispense Refill  . acetaminophen (TYLENOL) 500 MG tablet Take 1,000 mg by mouth every 6 (six) hours as needed for mild pain or moderate pain.    Marland Kitchen amLODipine (NORVASC) 10 MG tablet Take 10 mg by mouth daily.    . clotrimazole-betamethasone (LOTRISONE) cream Apply 1 application topically 2 (two) times daily. 45 g 1  . lisinopril-hydrochlorothiazide (PRINZIDE,ZESTORETIC) 10-12.5 MG tablet Take 1 tablet by mouth daily.    Marland Kitchen loperamide (IMODIUM) 2 MG  capsule Take 4 mg by mouth as needed for diarrhea or loose stools.    . prochlorperazine (COMPAZINE) 5 MG tablet Take 1 tablet by mouth every 6 (six) hours as needed.  0  . spironolactone (ALDACTONE) 25 MG tablet Take 25 mg by mouth daily.    . sucralfate (CARAFATE) 1 GM/10ML suspension Take 10 mLs (1 g total) by mouth 4 (four) times daily -  with meals and at bedtime. 420 mL 0   No current facility-administered medications for this visit.     SURGICAL HISTORY:  Past Surgical History:  Procedure Laterality Date  . BACK SURGERY  2009   ruptured disc    REVIEW OF SYSTEMS:  A comprehensive review of systems was negative.   PHYSICAL EXAMINATION: General appearance: alert, cooperative and no distress Head: Normocephalic, without obvious abnormality, atraumatic Neck: no adenopathy, no JVD, supple, symmetrical, trachea midline and thyroid not enlarged, symmetric, no tenderness/mass/nodules Lymph nodes: Cervical, supraclavicular, and axillary nodes normal. Resp: clear to auscultation bilaterally Back: symmetric, no curvature. ROM normal. No CVA tenderness. Cardio: regular rate and rhythm, S1, S2 normal, no murmur, click, rub or gallop GI: soft, non-tender; bowel sounds normal; no masses,  no organomegaly Extremities: extremities normal, atraumatic, no cyanosis or edema  ECOG PERFORMANCE STATUS: 1 - Symptomatic but completely ambulatory  Blood pressure (!) 115/50, pulse 81, temperature 98.2 F (36.8 C), temperature source Oral, resp. rate 18, height _0  (1.6 m), weight 166 lb 14.4 oz (75.7 kg), SpO2 100 %.  LABORATORY DATA: Lab Results  Component Value Date  WBC 6.4 10/29/2017   HGB 10.9 (L) 10/29/2017   HCT 33.7 (L) 10/29/2017   MCV 87.3 10/29/2017   PLT 262 10/29/2017      Chemistry      Component Value Date/Time   NA 142 10/29/2017 0901   K 4.5 10/29/2017 0901   CL 109 10/29/2017 0901   CO2 24 10/29/2017 0901   BUN 21 10/29/2017 0901   CREATININE 0.97 10/29/2017 0901       Component Value Date/Time   CALCIUM 10.2 10/29/2017 0901   ALKPHOS 95 10/29/2017 0901   AST 11 (L) 10/29/2017 0901   ALT 7 10/29/2017 0901   BILITOT 0.3 10/29/2017 0901       RADIOGRAPHIC STUDIES: No results found.  ASSESSMENT AND PLAN: This is a very pleasant 62 years old [name] with stage IIIB non-small cell lung cancer, adenocarcinoma.   The patient underwent a course of concurrent chemoradiation with weekly carboplatin and paclitaxel status post 7 cycles.  She tolerated this course of treatment fairly well with no concerning complaints.   The patient is currently undergoing treatment with consolidation immunotherapy with Imfinzi (Durvalumab) 10 mg/KG every 2 weeks status post 4 cycles. The patient continues to tolerate her treatment well. I recommended for the patient to proceed with cycle #5 today as scheduled. She will come back for follow-up visit in 2 weeks for evaluation before starting cycle #6. She was advised to call immediately if she has any concerning symptoms in the interval. The patient voices understanding of current disease status and treatment options and is in agreement with the current care plan. All questions were answered. The patient knows to call the clinic with any problems, questions or concerns. We can certainly see the patient much sooner if necessary.  I spent 10 minutes counseling the patient face to face. The total time spent in the appointment was 15 minutes.  Disclaimer: This note was dictated with voice recognition software. Similar sounding words can inadvertently be transcribed and may not be corrected upon review.

## 2017-10-29 NOTE — Patient Instructions (Signed)
[facility] Discharge Instructions for Patients Receiving Chemotherapy  Today you received the following chemotherapy agents: Imfinzi.  To help prevent nausea and vomiting after your treatment, we encourage you to take your nausea medication as directed.   If you develop nausea and vomiting that is not controlled by your nausea medication, call the clinic.   BELOW ARE SYMPTOMS THAT SHOULD BE REPORTED IMMEDIATELY:  *FEVER GREATER THAN 100.5 F  *CHILLS WITH OR WITHOUT FEVER  NAUSEA AND VOMITING THAT IS NOT CONTROLLED WITH YOUR NAUSEA MEDICATION  *UNUSUAL SHORTNESS OF BREATH  *UNUSUAL BRUISING OR BLEEDING  TENDERNESS IN MOUTH AND THROAT WITH OR WITHOUT PRESENCE OF ULCERS  *URINARY PROBLEMS  *BOWEL PROBLEMS  UNUSUAL RASH Items with * indicate a potential emergency and should be followed up as soon as possible.  Feel free to call the clinic should you have any questions or concerns. The clinic phone number is [phone].  Please show the CHEMO ALERT CARD at check-in to the Emergency Department and triage nurse.   

## 2017-10-29 NOTE — Telephone Encounter (Signed)
Next 3 cycles already scheduled per 9/12 los.

## 2017-10-30 ENCOUNTER — Telehealth: Payer: Self-pay | Admitting: *Deleted

## 2017-10-30 NOTE — Telephone Encounter (Signed)
Pt called back, very upset and frustrated regarding her schedule, pt was unable to speak directly with scheduling.   Spent 15 minutes talking with pt and listening to her concerns about yesterday wait in the waiting room, not seeing the MD on time, schedule date/times changed without being notified.  Pt would like to s/w Director of scheduling to voice concerns.  Message routed to [facility]. No further concerns.

## 2017-10-30 NOTE — Telephone Encounter (Signed)
Pt called today with concerns her appts changed and she was not notified. Pt requested to speak with Audie Clear to discuss concerns. Call transferred per pt request.

## 2017-11-02 ENCOUNTER — Telehealth: Payer: Self-pay | Admitting: Internal Medicine

## 2017-11-02 NOTE — Telephone Encounter (Signed)
MM PAL 9/26 - moved appointments to 9/25 with [facility]. Spoke with patient.

## 2017-11-11 ENCOUNTER — Encounter: Payer: Self-pay | Admitting: Oncology

## 2017-11-11 ENCOUNTER — Inpatient Hospital Stay (HOSPITAL_BASED_OUTPATIENT_CLINIC_OR_DEPARTMENT_OTHER): Admitting: Oncology

## 2017-11-11 ENCOUNTER — Inpatient Hospital Stay

## 2017-11-11 VITALS — BP 122/63 | HR 70 | Temp 99.2°F | Resp 17 | Ht 63.0 in | Wt 169.9 lb

## 2017-11-11 DIAGNOSIS — C3411 Malignant neoplasm of upper lobe, right bronchus or lung: Secondary | ICD-10-CM

## 2017-11-11 DIAGNOSIS — Z5112 Encounter for antineoplastic immunotherapy: Secondary | ICD-10-CM

## 2017-11-11 DIAGNOSIS — Z923 Personal history of irradiation: Secondary | ICD-10-CM | POA: Diagnosis not present

## 2017-11-11 DIAGNOSIS — I10 Essential (primary) hypertension: Secondary | ICD-10-CM

## 2017-11-11 DIAGNOSIS — C3490 Malignant neoplasm of unspecified part of unspecified bronchus or lung: Secondary | ICD-10-CM

## 2017-11-11 DIAGNOSIS — Z79899 Other long term (current) drug therapy: Secondary | ICD-10-CM

## 2017-11-11 DIAGNOSIS — Z9221 Personal history of antineoplastic chemotherapy: Secondary | ICD-10-CM

## 2017-11-11 LAB — CBC WITH DIFFERENTIAL (CANCER CENTER ONLY)
Basophils Absolute: 0.1 10*3/uL (ref 0.0–0.1)
Basophils Relative: 1 %
EOS PCT: 3 %
Eosinophils Absolute: 0.2 10*3/uL (ref 0.0–0.5)
HCT: 34.7 % — ABNORMAL LOW (ref 34.8–46.6)
Hemoglobin: 11.3 g/dL — ABNORMAL LOW (ref 11.6–15.9)
LYMPHS ABS: 1 10*3/uL (ref 0.9–3.3)
LYMPHS PCT: 14 %
MCH: 27.2 pg (ref 25.1–34.0)
MCHC: 32.4 g/dL (ref 31.5–36.0)
MCV: 83.9 fL (ref 79.5–101.0)
MONO ABS: 0.6 10*3/uL (ref 0.1–0.9)
Monocytes Relative: 8 %
Neutro Abs: 4.9 10*3/uL (ref 1.5–6.5)
Neutrophils Relative %: 74 %
PLATELETS: 297 10*3/uL (ref 145–400)
RBC: 4.14 MIL/uL (ref 3.70–5.45)
RDW: 14.2 % (ref 11.2–14.5)
WBC Count: 6.7 10*3/uL (ref 3.9–10.3)

## 2017-11-11 LAB — CMP (CANCER CENTER ONLY)
ALT: 8 U/L (ref 0–44)
AST: 10 U/L — ABNORMAL LOW (ref 15–41)
Albumin: 3.4 g/dL — ABNORMAL LOW (ref 3.5–5.0)
Alkaline Phosphatase: 101 U/L (ref 38–126)
Anion gap: 7 (ref 5–15)
BUN: 19 mg/dL (ref 8–23)
CHLORIDE: 110 mmol/L (ref 98–111)
CO2: 27 mmol/L (ref 22–32)
CREATININE: 1.01 mg/dL — AB (ref 0.44–1.00)
Calcium: 9.9 mg/dL (ref 8.9–10.3)
GFR, EST NON AFRICAN AMERICAN: 58 mL/min — AB (ref >60)
GFR, Est AFR Am: >60 mL/min (ref >60)
Glucose, Bld: 110 mg/dL — ABNORMAL HIGH (ref 70–99)
POTASSIUM: 4.4 mmol/L (ref 3.5–5.1)
Sodium: 144 mmol/L (ref 135–145)
TOTAL PROTEIN: 7.4 g/dL (ref 6.5–8.1)

## 2017-11-11 MED ORDER — SODIUM CHLORIDE 0.9 % IV SOLN
10.0000 mg/kg | Freq: Once | INTRAVENOUS | Status: AC
  Administered 2017-11-11: 740 mg via INTRAVENOUS
  Filled 2017-11-11: qty 4.8

## 2017-11-11 MED ORDER — SODIUM CHLORIDE 0.9 % IV SOLN
Freq: Once | INTRAVENOUS | Status: AC
  Administered 2017-11-11: 14:00:00 via INTRAVENOUS
  Filled 2017-11-11: qty 250

## 2017-11-11 NOTE — Assessment & Plan Note (Signed)
This is a very pleasant 62 year old [name] with stage IIIB non-small cell lung cancer, adenocarcinoma.   The patient underwent a course of concurrent chemoradiation with weekly carboplatin and paclitaxel status post 7 cycles.  She tolerated this course of treatment fairly well with no concerning complaints.   The patient is currently undergoing treatment with consolidation immunotherapy with Imfinzi (Durvalumab) 10 mg/KG every 2 weeks status post 5 cycles. The patient continues to tolerate her treatment well. I recommended for the patient to proceed with cycle #6 today as scheduled. She will have a restaging CT scan of the chest prior to her next visit which is already scheduled. She will come back for follow-up visit in 2 weeks for evaluation before starting cycle #7 and to review her restaging CT scan results.  She was advised to call immediately if she has any concerning symptoms in the interval. The patient voices understanding of current disease status and treatment options and is in agreement with the current care plan. All questions were answered. The patient knows to call the clinic with any problems, questions or concerns. We can certainly see the patient much sooner if necessary.

## 2017-11-11 NOTE — Progress Notes (Signed)
[facility] OFFICE PROGRESS NOTE  [name], [address] [place] [address]  DIAGNOSIS:stageIIIB (T3, N2, M0)non-small cell lung cancer, adenocarcinoma diagnosed in March 2019 and presented with large right upper lobe lung mass in addition to right hilar and mediastinal lymphadenopathy.  PD-L10%  PRIOR THERAPY:Concurrent chemoradiation with weekly carboplatin for an AUC of 2 and paclitaxel 45 mg/m2. First dose expected on 06/01/2017.  Status post 7 cycles.  Last dose was giving 07/14/2017.  CURRENT THERAPY: Consolidation treatment with immunotherapy with Imfinzi (Durvalumab) 10 mg/KG every 2 weeks status post 5 cycles..  INTERVAL HISTORY: Ashley Wilson 62 y.o. female returns for routine follow-up visit.  The patient is feeling fine today with no concerning complaints.  She continues to tolerate treatment with Imfinzi fairly well.  She denies fevers and chills.  Denies chest pain, shortness breath, cough, hemoptysis.  Denies nausea, vomiting, constipation, diarrhea.  Denies recent weight loss or night sweats.  Denies rashes.  The patient is here for evaluation prior to starting cycle #6 of her treatment.  MEDICAL HISTORY: Past Medical History:  Diagnosis Date  . Compression fracture of lumbar vertebra ([place])   . Hypertension     ALLERGIES:  has No Known Allergies.  MEDICATIONS:  Current Outpatient Medications  Medication Sig Dispense Refill  . acetaminophen (TYLENOL) 500 MG tablet Take 1,000 mg by mouth every 6 (six) hours as needed for mild pain or moderate pain.    Marland Kitchen amLODipine (NORVASC) 10 MG tablet Take 10 mg by mouth daily.    . clotrimazole-betamethasone (LOTRISONE) cream Apply 1 application topically 2 (two) times daily. 45 g 1  . lisinopril-hydrochlorothiazide (PRINZIDE,ZESTORETIC) 10-12.5 MG tablet Take 1 tablet by mouth daily.    Marland Kitchen loperamide (IMODIUM) 2 MG capsule Take 4 mg by mouth as needed for diarrhea or loose stools.    .  prochlorperazine (COMPAZINE) 5 MG tablet Take 1 tablet by mouth every 6 (six) hours as needed.  0  . spironolactone (ALDACTONE) 25 MG tablet Take 25 mg by mouth daily.    . sucralfate (CARAFATE) 1 GM/10ML suspension Take 10 mLs (1 g total) by mouth 4 (four) times daily -  with meals and at bedtime. 420 mL 0   No current facility-administered medications for this visit.     SURGICAL HISTORY:  Past Surgical History:  Procedure Laterality Date  . BACK SURGERY  2009   ruptured disc    REVIEW OF SYSTEMS:   Review of Systems  Constitutional: Negative for appetite change, chills, fatigue, fever and unexpected weight change.  HENT:   Negative for mouth sores, nosebleeds, sore throat and trouble swallowing.   Eyes: Negative for eye problems and icterus.  Respiratory: Negative for cough, hemoptysis, shortness of breath and wheezing.   Cardiovascular: Negative for chest pain and leg swelling.  Gastrointestinal: Negative for abdominal pain, constipation, diarrhea, nausea and vomiting.  Genitourinary: Negative for bladder incontinence, difficulty urinating, dysuria, frequency and hematuria.   Musculoskeletal: Negative for back pain, gait problem, neck pain and neck stiffness.  Skin: Negative for itching and rash.  Neurological: Negative for dizziness, extremity weakness, gait problem, headaches, light-headedness and seizures.  Hematological: Negative for adenopathy. Does not bruise/bleed easily.  Psychiatric/Behavioral: Negative for confusion, depression and sleep disturbance. The patient is not nervous/anxious.     PHYSICAL EXAMINATION:  Blood pressure 122/63, pulse 70, temperature 99.2 F (37.3 C), temperature source Oral, resp. rate 17, height 5' 3" (1.6 m), weight 169 lb 14.4 oz (77.1 kg),  SpO2 100 %.  ECOG PERFORMANCE STATUS: 1 - Symptomatic but completely ambulatory  Physical Exam  Constitutional: Oriented to person, place, and time and well-developed, well-nourished, and in no  distress. No distress.  HENT:  Head: Normocephalic and atraumatic.  Mouth/Throat: Oropharynx is clear and moist. No oropharyngeal exudate.  Eyes: Conjunctivae are normal. Right eye exhibits no discharge. Left eye exhibits no discharge. No scleral icterus.  Neck: Normal range of motion. Neck supple.  Cardiovascular: Normal rate, regular rhythm, normal heart sounds and intact distal pulses.   Pulmonary/Chest: Effort normal and breath sounds normal. No respiratory distress. No wheezes. No rales.  Abdominal: Soft. Bowel sounds are normal. Exhibits no distension and no mass. There is no tenderness.  Musculoskeletal: Normal range of motion. Exhibits no edema.  Lymphadenopathy:    No cervical adenopathy.  Neurological: Alert and oriented to person, place, and time. Exhibits normal muscle tone. Gait normal. Coordination normal.  Skin: Skin is warm and dry. No rash noted. Not diaphoretic. No erythema. No pallor.  Psychiatric: Mood, memory and judgment normal.  Vitals reviewed.  LABORATORY DATA: Lab Results  Component Value Date   WBC 6.7 11/11/2017   HGB 11.3 (L) 11/11/2017   HCT 34.7 (L) 11/11/2017   MCV 83.9 11/11/2017   PLT 297 11/11/2017      Chemistry      Component Value Date/Time   NA 144 11/11/2017 1219   K 4.4 11/11/2017 1219   CL 110 11/11/2017 1219   CO2 27 11/11/2017 1219   BUN 19 11/11/2017 1219   CREATININE 1.01 (H) 11/11/2017 1219      Component Value Date/Time   CALCIUM 9.9 11/11/2017 1219   ALKPHOS 101 11/11/2017 1219   AST 10 (L) 11/11/2017 1219   ALT 8 11/11/2017 1219   BILITOT <0.2 (L) 11/11/2017 1219       RADIOGRAPHIC STUDIES:  No results found.   ASSESSMENT/PLAN:  Malignant neoplasm of bronchus of right upper lobe [facility]) This is a very pleasant 62 year old [name] with stage IIIB non-small cell lung cancer, adenocarcinoma.   The patient underwent a course of concurrent chemoradiation with weekly carboplatin and paclitaxel status post  7 cycles.  She tolerated this course of treatment fairly well with no concerning complaints.   The patient is currently undergoing treatment with consolidation immunotherapy with Imfinzi (Durvalumab) 10 mg/KG every 2 weeks status post 5 cycles. The patient continues to tolerate her treatment well. I recommended for the patient to proceed with cycle #6 today as scheduled. She will have a restaging CT scan of the chest prior to her next visit which is already scheduled. She will come back for follow-up visit in 2 weeks for evaluation before starting cycle #7 and to review her restaging CT scan results.  She was advised to call immediately if she has any concerning symptoms in the interval. The patient voices understanding of current disease status and treatment options and is in agreement with the current care plan. All questions were answered. The patient knows to call the clinic with any problems, questions or concerns. We can certainly see the patient much sooner if necessary.   No orders of the defined types were placed in this encounter.    Mikey Bussing, DNP, AGPCNP-BC, AOCNP 11/11/17

## 2017-11-11 NOTE — Patient Instructions (Signed)
[facility] Discharge Instructions for Patients Receiving Chemotherapy  Today you received the following chemotherapy agents: Imfinzi.  To help prevent nausea and vomiting after your treatment, we encourage you to take your nausea medication as directed.   If you develop nausea and vomiting that is not controlled by your nausea medication, call the clinic.   BELOW ARE SYMPTOMS THAT SHOULD BE REPORTED IMMEDIATELY:  *FEVER GREATER THAN 100.5 F  *CHILLS WITH OR WITHOUT FEVER  NAUSEA AND VOMITING THAT IS NOT CONTROLLED WITH YOUR NAUSEA MEDICATION  *UNUSUAL SHORTNESS OF BREATH  *UNUSUAL BRUISING OR BLEEDING  TENDERNESS IN MOUTH AND THROAT WITH OR WITHOUT PRESENCE OF ULCERS  *URINARY PROBLEMS  *BOWEL PROBLEMS  UNUSUAL RASH Items with * indicate a potential emergency and should be followed up as soon as possible.  Feel free to call the clinic should you have any questions or concerns. The clinic phone number is [phone].  Please show the CHEMO ALERT CARD at check-in to the Emergency Department and triage nurse.   

## 2017-11-12 ENCOUNTER — Other Ambulatory Visit

## 2017-11-12 ENCOUNTER — Ambulatory Visit: Admitting: Internal Medicine

## 2017-11-12 ENCOUNTER — Telehealth: Payer: Self-pay | Admitting: Oncology

## 2017-11-12 ENCOUNTER — Ambulatory Visit

## 2017-11-12 NOTE — Telephone Encounter (Signed)
Scheduled appt per 9/25 los - pt to get an updated schedule next visit.

## 2017-11-23 ENCOUNTER — Ambulatory Visit (HOSPITAL_COMMUNITY)
Admission: RE | Admit: 2017-11-23 | Discharge: 2017-11-23 | Disposition: A | Discharge status: Home / Self Care | Source: Ambulatory Visit | Attending: Internal Medicine | Admitting: Internal Medicine

## 2017-11-23 ENCOUNTER — Encounter (HOSPITAL_COMMUNITY): Payer: Self-pay

## 2017-11-23 DIAGNOSIS — R590 Localized enlarged lymph nodes: Secondary | ICD-10-CM | POA: Diagnosis not present

## 2017-11-23 DIAGNOSIS — I2510 Atherosclerotic heart disease of native coronary artery without angina pectoris: Secondary | ICD-10-CM | POA: Diagnosis not present

## 2017-11-23 DIAGNOSIS — C3490 Malignant neoplasm of unspecified part of unspecified bronchus or lung: Secondary | ICD-10-CM | POA: Diagnosis not present

## 2017-11-23 DIAGNOSIS — I700 Atherosclerosis of aorta: Secondary | ICD-10-CM | POA: Diagnosis not present

## 2017-11-23 HISTORY — DX: Malignant (primary) neoplasm, unspecified: C80.1

## 2017-11-23 MED ORDER — IOHEXOL 300 MG/ML  SOLN
75.0000 mL | Freq: Once | INTRAMUSCULAR | Status: AC | PRN
  Administered 2017-11-23: 75 mL via INTRAVENOUS

## 2017-11-23 MED ORDER — SODIUM CHLORIDE 0.9 % IJ SOLN
INTRAMUSCULAR | Status: AC
  Filled 2017-11-23: qty 50

## 2017-11-25 ENCOUNTER — Encounter: Payer: Self-pay | Admitting: Oncology

## 2017-11-25 ENCOUNTER — Inpatient Hospital Stay (HOSPITAL_BASED_OUTPATIENT_CLINIC_OR_DEPARTMENT_OTHER): Admitting: Oncology

## 2017-11-25 ENCOUNTER — Inpatient Hospital Stay

## 2017-11-25 ENCOUNTER — Telehealth: Payer: Self-pay | Admitting: Oncology

## 2017-11-25 ENCOUNTER — Inpatient Hospital Stay: Attending: Internal Medicine

## 2017-11-25 VITALS — HR 65 | Resp 16

## 2017-11-25 VITALS — BP 106/59 | HR 59 | Temp 97.7°F | Resp 21 | Ht 63.0 in | Wt 172.7 lb

## 2017-11-25 DIAGNOSIS — Z79899 Other long term (current) drug therapy: Secondary | ICD-10-CM | POA: Diagnosis not present

## 2017-11-25 DIAGNOSIS — Z923 Personal history of irradiation: Secondary | ICD-10-CM | POA: Insufficient documentation

## 2017-11-25 DIAGNOSIS — I10 Essential (primary) hypertension: Secondary | ICD-10-CM | POA: Diagnosis not present

## 2017-11-25 DIAGNOSIS — Z9221 Personal history of antineoplastic chemotherapy: Secondary | ICD-10-CM | POA: Insufficient documentation

## 2017-11-25 DIAGNOSIS — C3411 Malignant neoplasm of upper lobe, right bronchus or lung: Secondary | ICD-10-CM | POA: Insufficient documentation

## 2017-11-25 DIAGNOSIS — Z5112 Encounter for antineoplastic immunotherapy: Secondary | ICD-10-CM

## 2017-11-25 DIAGNOSIS — C3490 Malignant neoplasm of unspecified part of unspecified bronchus or lung: Secondary | ICD-10-CM

## 2017-11-25 LAB — CBC WITH DIFFERENTIAL (CANCER CENTER ONLY)
Abs Immature Granulocytes: 0.06 10*3/uL (ref 0.00–0.07)
BASOS PCT: 1 %
Basophils Absolute: 0.1 10*3/uL (ref 0.0–0.1)
Eosinophils Absolute: 0.2 10*3/uL (ref 0.0–0.5)
Eosinophils Relative: 3 %
HEMATOCRIT: 39.7 % (ref 36.0–46.0)
Hemoglobin: 12.5 g/dL (ref 12.0–15.0)
Immature Granulocytes: 1 %
Lymphocytes Relative: 18 %
Lymphs Abs: 1.1 10*3/uL (ref 0.7–4.0)
MCH: 26.9 pg (ref 26.0–34.0)
MCHC: 31.5 g/dL (ref 30.0–36.0)
MCV: 85.4 fL (ref 80.0–100.0)
MONO ABS: 0.5 10*3/uL (ref 0.1–1.0)
Monocytes Relative: 7 %
Neutro Abs: 4.5 10*3/uL (ref 1.7–7.7)
Neutrophils Relative %: 70 %
Platelet Count: 315 10*3/uL (ref 150–400)
RBC: 4.65 MIL/uL (ref 3.87–5.11)
RDW: 14.8 % (ref 11.5–15.5)
WBC: 6.5 10*3/uL (ref 4.0–10.5)
nRBC: 0 % (ref 0.0–0.2)

## 2017-11-25 LAB — CMP (CANCER CENTER ONLY)
ALK PHOS: 127 U/L — AB (ref 38–126)
ALT: <6 U/L (ref 0–44)
AST: 14 U/L — AB (ref 15–41)
Albumin: 4 g/dL (ref 3.5–5.0)
Anion gap: 13 (ref 5–15)
BILIRUBIN TOTAL: 0.4 mg/dL (ref 0.3–1.2)
BUN: 16 mg/dL (ref 8–23)
CO2: 21 mmol/L — ABNORMAL LOW (ref 22–32)
Calcium: 9.8 mg/dL (ref 8.9–10.3)
Chloride: 107 mmol/L (ref 98–111)
Creatinine: 1.13 mg/dL — ABNORMAL HIGH (ref 0.44–1.00)
GFR, EST NON AFRICAN AMERICAN: 50 mL/min — AB (ref >60)
GFR, Est AFR Am: 58 mL/min — ABNORMAL LOW (ref >60)
Glucose, Bld: 88 mg/dL (ref 70–99)
POTASSIUM: 4.3 mmol/L (ref 3.5–5.1)
Sodium: 141 mmol/L (ref 135–145)
Total Protein: 8.6 g/dL — ABNORMAL HIGH (ref 6.5–8.1)

## 2017-11-25 LAB — TSH: TSH: 9.583 u[IU]/mL — ABNORMAL HIGH (ref 0.308–3.960)

## 2017-11-25 MED ORDER — SODIUM CHLORIDE 0.9 % IV SOLN
10.0000 mg/kg | Freq: Once | INTRAVENOUS | Status: AC
  Administered 2017-11-25: 740 mg via INTRAVENOUS
  Filled 2017-11-25: qty 4.8

## 2017-11-25 MED ORDER — SODIUM CHLORIDE 0.9 % IV SOLN
Freq: Once | INTRAVENOUS | Status: AC
  Administered 2017-11-25: 15:00:00 via INTRAVENOUS
  Filled 2017-11-25: qty 250

## 2017-11-25 NOTE — Patient Instructions (Signed)
[facility] Discharge Instructions for Patients Receiving Chemotherapy  Today you received the following chemotherapy agents: Imfinzi.  To help prevent nausea and vomiting after your treatment, we encourage you to take your nausea medication as directed.   If you develop nausea and vomiting that is not controlled by your nausea medication, call the clinic.   BELOW ARE SYMPTOMS THAT SHOULD BE REPORTED IMMEDIATELY:  *FEVER GREATER THAN 100.5 F  *CHILLS WITH OR WITHOUT FEVER  NAUSEA AND VOMITING THAT IS NOT CONTROLLED WITH YOUR NAUSEA MEDICATION  *UNUSUAL SHORTNESS OF BREATH  *UNUSUAL BRUISING OR BLEEDING  TENDERNESS IN MOUTH AND THROAT WITH OR WITHOUT PRESENCE OF ULCERS  *URINARY PROBLEMS  *BOWEL PROBLEMS  UNUSUAL RASH Items with * indicate a potential emergency and should be followed up as soon as possible.  Feel free to call the clinic should you have any questions or concerns. The clinic phone number is [phone].  Please show the CHEMO ALERT CARD at check-in to the Emergency Department and triage nurse.   

## 2017-11-25 NOTE — Assessment & Plan Note (Addendum)
This is a very pleasant 62year old [name] with stage IIIB non-small cell lung cancer, adenocarcinoma.  The patient underwent a course of concurrent chemoradiation with weekly carboplatin and paclitaxel status post 7 cycles. She tolerated this course of treatment fairly well with no concerning complaints.  The patient is currently undergoing treatment with consolidation immunotherapy with Imfinzi (Durvalumab) 10 mg/KG every 2 weeks status post6cycles. The patient continues to tolerate her treatment well. She had a restaging CT scan of the chest and is here to discuss the results.  The patient was seen with Dr. Julien Nordmann.  CT scan results and images were reviewed with the patient and her granddaughter.  We discussed that her lung mass is now smaller on her CT scan.  Recommend that she continue Imfinzi.  She will proceed with cycle #7 today as scheduled.  The patient will follow-up in 2 weeks for evaluation prior to cycle #8.  She was advised to call immediately if she has any concerning symptoms in the interval. The patient voices understanding of current disease status and treatment options and is in agreement with the current care plan. All questions were answered. The patient knows to call the clinic with any problems, questions or concerns. We can certainly see the patient much sooner if necessary.

## 2017-11-25 NOTE — Telephone Encounter (Signed)
3 cycles already scheduled per 10/9 los - no additional appts added.

## 2017-11-25 NOTE — Progress Notes (Signed)
[facility] OFFICE PROGRESS NOTE  [name], [address] [place] [address]  DIAGNOSIS:stageIIIB (T3, N2, M0)non-small cell lung cancer, adenocarcinoma diagnosed in March 2019 and presented with large right upper lobe lung mass in addition to right hilar and mediastinal lymphadenopathy.  PD-L10%  PRIOR THERAPY:Concurrent chemoradiation with weekly carboplatin for an AUC of 2 and paclitaxel 45 mg/m2. First dose expected on 06/01/2017. Status post 7 cycles. Last dose was giving 07/14/2017.  CURRENT THERAPY:Consolidation treatment with immunotherapy with Imfinzi (Durvalumab) 10 mg/KG every 2 weeks status post 6cycles..  INTERVAL HISTORY: Ashley Wilson 62 y.o. female returns for routine follow-up visit accompanied by her granddaughter.  The patient is feeling fine today and has no concerning complaints.  She continues to tolerate treatment with Imfinzi fairly well.  She denies fevers and chills.  Denies chest pain, shortness of breath, cough, hemoptysis.  Denies nausea, vomiting, constipation, diarrhea.  Denies recent weight loss or night sweats.  Denies rashes.  The patient is here for evaluation prior to cycle #7 of her treatment and to review her restaging CT scan of the chest.  MEDICAL HISTORY: Past Medical History:  Diagnosis Date  . Compression fracture of lumbar vertebra ([place])   . Hypertension   . NSCL ca dx'd 05/2017    ALLERGIES:  has No Known Allergies.  MEDICATIONS:  Current Outpatient Medications  Medication Sig Dispense Refill  . amLODipine (NORVASC) 10 MG tablet Take 10 mg by mouth daily.    . clotrimazole-betamethasone (LOTRISONE) cream Apply 1 application topically 2 (two) times daily. 45 g 1  . lisinopril-hydrochlorothiazide (PRINZIDE,ZESTORETIC) 10-12.5 MG tablet Take 1 tablet by mouth daily.    Marland Kitchen loperamide (IMODIUM) 2 MG capsule Take 4 mg by mouth as needed for diarrhea or loose stools.    . prochlorperazine (COMPAZINE) 5 MG  tablet Take 1 tablet by mouth every 6 (six) hours as needed.  0  . spironolactone (ALDACTONE) 25 MG tablet Take 25 mg by mouth daily.    Marland Kitchen acetaminophen (TYLENOL) 500 MG tablet Take 1,000 mg by mouth every 6 (six) hours as needed for mild pain or moderate pain.     No current facility-administered medications for this visit.    Facility-Administered Medications Ordered in Other Visits  Medication Dose Route Frequency Provider Last Rate Last Dose  . 0.9 %  sodium chloride infusion   Intravenous Once Curt Bears, MD      . durvalumab [facility]) 740 mg in sodium chloride 0.9 % 100 mL chemo infusion  10 mg/kg (Treatment Plan Recorded) Intravenous Once Curt Bears, MD        SURGICAL HISTORY:  Past Surgical History:  Procedure Laterality Date  . BACK SURGERY  2009   ruptured disc    REVIEW OF SYSTEMS:   Review of Systems  Constitutional: Negative for appetite change, chills, fatigue, fever and unexpected weight change.  HENT:   Negative for mouth sores, nosebleeds, sore throat and trouble swallowing.   Eyes: Negative for eye problems and icterus.  Respiratory: Negative for cough, hemoptysis, shortness of breath and wheezing.   Cardiovascular: Negative for chest pain and leg swelling.  Gastrointestinal: Negative for abdominal pain, constipation, diarrhea, nausea and vomiting.  Genitourinary: Negative for bladder incontinence, difficulty urinating, dysuria, frequency and hematuria.   Musculoskeletal: Negative for back pain, gait problem, neck pain and neck stiffness.  Skin: Negative for itching and rash.  Neurological: Negative for dizziness, extremity weakness, gait problem, headaches, light-headedness and seizures.  Hematological: Negative  for adenopathy. Does not bruise/bleed easily.  Psychiatric/Behavioral: Negative for confusion, depression and sleep disturbance. The patient is not nervous/anxious.     PHYSICAL EXAMINATION:  Blood pressure (!) 106/59, pulse (!) 59,  temperature 97.7 F (36.5 C), temperature source Oral, resp. rate (!) 21, height 5' 3"  (1.6 m), weight 172 lb 11.2 oz (78.3 kg), SpO2 100 %.  ECOG PERFORMANCE STATUS: 1 - Symptomatic but completely ambulatory  Physical Exam  Constitutional: Oriented to person, place, and time and well-developed, well-nourished, and in no distress. No distress.  HENT:  Head: Normocephalic and atraumatic.  Mouth/Throat: Oropharynx is clear and moist. No oropharyngeal exudate.  Eyes: Conjunctivae are normal. Right eye exhibits no discharge. Left eye exhibits no discharge. No scleral icterus.  Neck: Normal range of motion. Neck supple.  Cardiovascular: Normal rate, regular rhythm, normal heart sounds and intact distal pulses.   Pulmonary/Chest: Effort normal and breath sounds normal. No respiratory distress. No wheezes. No rales.  Abdominal: Soft. Bowel sounds are normal. Exhibits no distension and no mass. There is no tenderness.  Musculoskeletal: Normal range of motion. Exhibits no edema.  Lymphadenopathy:    No cervical adenopathy.  Neurological: Alert and oriented to person, place, and time. Exhibits normal muscle tone. Gait normal. Coordination normal.  Skin: Skin is warm and dry. No rash noted. Not diaphoretic. No erythema. No pallor.  Psychiatric: Mood, memory and judgment normal.  Vitals reviewed.  LABORATORY DATA: Lab Results  Component Value Date   WBC 6.5 11/25/2017   HGB 12.5 11/25/2017   HCT 39.7 11/25/2017   MCV 85.4 11/25/2017   PLT 315 11/25/2017      Chemistry      Component Value Date/Time   NA 141 11/25/2017 1200   K 4.3 11/25/2017 1200   CL 107 11/25/2017 1200   CO2 21 (L) 11/25/2017 1200   BUN 16 11/25/2017 1200   CREATININE 1.13 (H) 11/25/2017 1200      Component Value Date/Time   CALCIUM 9.8 11/25/2017 1200   ALKPHOS 127 (H) 11/25/2017 1200   AST 14 (L) 11/25/2017 1200   ALT <6 11/25/2017 1200   BILITOT 0.4 11/25/2017 1200       RADIOGRAPHIC STUDIES:  Ct Chest  W Contrast  Result Date: 11/23/2017 CLINICAL DATA:  Non-small cell lung cancer, ongoing chemotherapy. Radiation therapy complete. EXAM: CT CHEST WITH CONTRAST TECHNIQUE: Multidetector CT imaging of the chest was performed during intravenous contrast administration. CONTRAST:  59m OMNIPAQUE IOHEXOL 300 MG/ML  SOLN COMPARISON:  08/05/2017. FINDINGS: Cardiovascular: Atherosclerotic calcification of the arterial vasculature, including coronary arteries and aortic valve. Heart size normal. No pericardial effusion. Mediastinum/Nodes: Subcentimeter low-attenuation lesion in the right thyroid is nonspecific. Mediastinal lymph nodes measure up to 10 mm in the low right paratracheal station, previously 12 mm when remeasured. AP window lymph node is minimally smaller as well, 5 mm compared to 7 mm previously. No hilar or axillary adenopathy. Esophagus is grossly unremarkable. Small hiatal hernia. Lungs/Pleura: Apical segment right upper lobe mass measures 3.7 x 4.8 cm, previously 4.2 x 6.1 cm. Mild surrounding ground-glass and smooth septal thickening. Centrilobular emphysema. Focal bullous lesion the anterolateral right mid hemithorax. Subsegmental atelectasis and/or scarring in the right lower lobe. Lungs are otherwise clear. No pleural fluid. Airway is unremarkable. Upper Abdomen: Visualized portion of the liver is unremarkable. There may be slight thickening of the adrenal glands. Visualized portions of the kidneys are unremarkable. There may be vague low-attenuation lesions in the spleen, measuring up to 12 mm, similar. Visualized portions  of the pancreas, stomach and bowel are grossly unremarkable. Upper abdominal lymph nodes are not enlarged by CT size criteria. Musculoskeletal: Degenerative changes in the spine. No worrisome lytic or sclerotic lesions. IMPRESSION: 1. Slight interval decrease in size of a right upper lobe mass. Surrounding septal thickening and ground-glass may be related to radiation therapy.  Mediastinal and hilar adenopathy has improved minimally in the interval as well. 2. Aortic atherosclerosis (ICD10-170.0). Coronary artery calcification. 3. Vague low-attenuation lesions in the spleen, similar but indeterminate. Electronically Signed   By: Lorin Picket M.D.   On: 11/23/2017 13:30     ASSESSMENT/PLAN:  Malignant neoplasm of bronchus of right upper lobe [facility]) This is a very pleasant 62year old [name] with stage IIIB non-small cell lung cancer, adenocarcinoma.  The patient underwent a course of concurrent chemoradiation with weekly carboplatin and paclitaxel status post 7 cycles. She tolerated this course of treatment fairly well with no concerning complaints.  The patient is currently undergoing treatment with consolidation immunotherapy with Imfinzi (Durvalumab) 10 mg/KG every 2 weeks status post6cycles. The patient continues to tolerate her treatment well. She had a restaging CT scan of the chest and is here to discuss the results.  The patient was seen with Dr. Julien Nordmann.  CT scan results and images were reviewed with the patient and her granddaughter.  We discussed that her lung mass is now smaller on her CT scan.  Recommend that she continue Imfinzi.  She will proceed with cycle #7 today as scheduled.  The patient will follow-up in 2 weeks for evaluation prior to cycle #8.  She was advised to call immediately if she has any concerning symptoms in the interval. The patient voices understanding of current disease status and treatment options and is in agreement with the current care plan. All questions were answered. The patient knows to call the clinic with any problems, questions or concerns. We can certainly see the patient much sooner if necessary.   No orders of the defined types were placed in this encounter.    Mikey Bussing, DNP, AGPCNP-BC, AOCNP 11/25/17   ADDENDUM: Hematology/Oncology Attending: I had a face-to-face encounter with the  patient.  I recommended her care plan.  This is a very pleasant 62 years old [name] with a stage IIIb non-small cell lung cancer, adenocarcinoma status post a course of concurrent chemoradiation with weekly carboplatin and paclitaxel with partial response.  The patient is currently undergoing treatment with consolidation immunotherapy with Imfinzi (Durvalumab) every 2 weeks status post 6 cycles.  She has been tolerating this treatment well with no concerning adverse effects. The patient had repeat CT scan of the chest performed recently.  I personally and independently reviewed the scan images and discussed the result and showed the images to the patient and her daughter today. HIDA scan showed further improvement of her disease with shrinkage of the right lung mass. I recommended for the patient to continue her current treatment with Imfinzi (Durvalumab) and she will proceed with cycle #7 today. She will come back for follow-up visit in 2 weeks for evaluation before starting cycle #8. The patient was advised to call immediately if she has any concerning symptoms in the interval.  Disclaimer: This note was dictated with voice recognition software. Similar sounding words can inadvertently be transcribed and may be missed upon review. Eilleen Kempf, MD 11/25/17

## 2017-11-26 ENCOUNTER — Other Ambulatory Visit: Payer: Self-pay | Admitting: Internal Medicine

## 2017-11-26 MED ORDER — LEVOTHYROXINE SODIUM 50 MCG PO TABS: 50.0000 ug | ORAL_TABLET | Freq: Every day | ORAL | 1 refills | Status: DC

## 2017-11-27 ENCOUNTER — Telehealth: Payer: Self-pay | Admitting: Medical Oncology

## 2017-11-27 DIAGNOSIS — R7989 Other specified abnormal findings of blood chemistry: Secondary | ICD-10-CM

## 2017-11-27 MED ORDER — LEVOTHYROXINE SODIUM 50 MCG PO TABS: 50.0000 ug | ORAL_TABLET | Freq: Every day | ORAL | 1 refills | Status: DC

## 2017-11-27 NOTE — Telephone Encounter (Signed)
Pt notified to pick up synthroid medication and reason explained.

## 2017-11-30 ENCOUNTER — Telehealth: Payer: Self-pay | Admitting: *Deleted

## 2017-11-30 NOTE — Telephone Encounter (Signed)
TCT patient in response to a call received over the weekend regarding a prescription.  Spoke with Ashley Wilson. She states she called her pharmacy again this morning and everything is straightened out regarding her Synthroid medication that was called in on 11/27/17.  She will be able to pick it up after 10:30 this morning. Reviewed reasons for the medication. Pt verbalized understanding.

## 2017-12-10 ENCOUNTER — Inpatient Hospital Stay

## 2017-12-10 ENCOUNTER — Telehealth: Payer: Self-pay | Admitting: Internal Medicine

## 2017-12-10 ENCOUNTER — Encounter: Payer: Self-pay | Admitting: Internal Medicine

## 2017-12-10 ENCOUNTER — Inpatient Hospital Stay (HOSPITAL_BASED_OUTPATIENT_CLINIC_OR_DEPARTMENT_OTHER): Admitting: Internal Medicine

## 2017-12-10 VITALS — BP 117/58 | HR 63 | Temp 98.6°F | Resp 17 | Ht 63.0 in | Wt 176.2 lb

## 2017-12-10 DIAGNOSIS — Z5112 Encounter for antineoplastic immunotherapy: Secondary | ICD-10-CM | POA: Diagnosis not present

## 2017-12-10 DIAGNOSIS — Z923 Personal history of irradiation: Secondary | ICD-10-CM

## 2017-12-10 DIAGNOSIS — C3411 Malignant neoplasm of upper lobe, right bronchus or lung: Secondary | ICD-10-CM

## 2017-12-10 DIAGNOSIS — Z9221 Personal history of antineoplastic chemotherapy: Secondary | ICD-10-CM | POA: Diagnosis not present

## 2017-12-10 DIAGNOSIS — Z79899 Other long term (current) drug therapy: Secondary | ICD-10-CM

## 2017-12-10 DIAGNOSIS — I10 Essential (primary) hypertension: Secondary | ICD-10-CM

## 2017-12-10 DIAGNOSIS — C3490 Malignant neoplasm of unspecified part of unspecified bronchus or lung: Secondary | ICD-10-CM

## 2017-12-10 LAB — CMP (CANCER CENTER ONLY)
ALBUMIN: 3.8 g/dL (ref 3.5–5.0)
ALT: 7 U/L (ref 0–44)
ANION GAP: 11 (ref 5–15)
AST: 13 U/L — ABNORMAL LOW (ref 15–41)
Alkaline Phosphatase: 126 U/L (ref 38–126)
BUN: 13 mg/dL (ref 8–23)
CALCIUM: 9.6 mg/dL (ref 8.9–10.3)
CHLORIDE: 105 mmol/L (ref 98–111)
CO2: 24 mmol/L (ref 22–32)
Creatinine: 1.09 mg/dL — ABNORMAL HIGH (ref 0.44–1.00)
GFR, Estimated: 52 mL/min — ABNORMAL LOW (ref >60)
GLUCOSE: 91 mg/dL (ref 70–99)
POTASSIUM: 4.6 mmol/L (ref 3.5–5.1)
SODIUM: 140 mmol/L (ref 135–145)
Total Bilirubin: 0.3 mg/dL (ref 0.3–1.2)
Total Protein: 7.9 g/dL (ref 6.5–8.1)

## 2017-12-10 LAB — CBC WITH DIFFERENTIAL (CANCER CENTER ONLY)
ABS IMMATURE GRANULOCYTES: 0.05 10*3/uL (ref 0.00–0.07)
BASOS ABS: 0.1 10*3/uL (ref 0.0–0.1)
Basophils Relative: 1 %
Eosinophils Absolute: 0.3 10*3/uL (ref 0.0–0.5)
Eosinophils Relative: 5 %
HEMATOCRIT: 38.4 % (ref 36.0–46.0)
Hemoglobin: 12 g/dL (ref 12.0–15.0)
IMMATURE GRANULOCYTES: 1 %
LYMPHS ABS: 1.1 10*3/uL (ref 0.7–4.0)
LYMPHS PCT: 17 %
MCH: 27.3 pg (ref 26.0–34.0)
MCHC: 31.3 g/dL (ref 30.0–36.0)
MCV: 87.5 fL (ref 80.0–100.0)
MONOS PCT: 8 %
Monocytes Absolute: 0.5 10*3/uL (ref 0.1–1.0)
NEUTROS ABS: 4.3 10*3/uL (ref 1.7–7.7)
NEUTROS PCT: 68 %
NRBC: 0 % (ref 0.0–0.2)
Platelet Count: 291 10*3/uL (ref 150–400)
RBC: 4.39 MIL/uL (ref 3.87–5.11)
RDW: 15.5 % (ref 11.5–15.5)
WBC Count: 6.3 10*3/uL (ref 4.0–10.5)

## 2017-12-10 MED ORDER — SODIUM CHLORIDE 0.9 % IV SOLN
10.0000 mg/kg | Freq: Once | INTRAVENOUS | Status: AC
  Administered 2017-12-10: 740 mg via INTRAVENOUS
  Filled 2017-12-10: qty 4.8

## 2017-12-10 MED ORDER — SODIUM CHLORIDE 0.9 % IV SOLN
Freq: Once | INTRAVENOUS | Status: AC
  Administered 2017-12-10: 13:00:00 via INTRAVENOUS
  Filled 2017-12-10: qty 250

## 2017-12-10 NOTE — Progress Notes (Signed)
[facility] [phone]   [phone]  OFFICE PROGRESS NOTE  [name], MD [address] [place] [address]  DIAGNOSIS: stageIIIB (T3, N2, M0)non-small cell lung cancer, adenocarcinoma diagnosed in March 2019 and presented with large right upper lobe lung mass in addition to right hilar and mediastinal lymphadenopathy.  PD-L1 0%  PRIOR THERAPY: Concurrent chemoradiation with weekly carboplatin for an AUC of 2 and paclitaxel 45 mg/m2.  First dose expected on 06/01/2017.  Status post 7 cycles.  Last dose was giving 07/14/2017.  CURRENT THERAPY:  Consolidation treatment with immunotherapy with Imfinzi (Durvalumab) 10 mg/KG every 2 weeks status post 7 cycles..  INTERVAL HISTORY: Ashley Wilson 62 y.o. female returns to the clinic today for follow-up visit.  The patient is feeling fine today with no specific complaints.  She denied having any chest pain, shortness of breath, cough or hemoptysis.  She has no nausea, vomiting, diarrhea or constipation.  She denied having any recent weight loss or night sweats.  She continues to tolerate her treatment with Imfinzi (Durvalumab) fairly well.  The patient is here today for evaluation before starting cycle #8.  MEDICAL HISTORY: Past Medical History:  Diagnosis Date  . Compression fracture of lumbar vertebra ([facility])   . Hypertension   . NSCL ca dx'd 05/2017    ALLERGIES:  has No Known Allergies.  MEDICATIONS:  Current Outpatient Medications  Medication Sig Dispense Refill  . acetaminophen (TYLENOL) 500 MG tablet Take 1,000 mg by mouth every 6 (six) hours as needed for mild pain or moderate pain.    Marland Kitchen amLODipine (NORVASC) 10 MG tablet Take 10 mg by mouth daily.    . clotrimazole-betamethasone (LOTRISONE) cream Apply 1 application topically 2 (two) times daily. 45 g 1  . levothyroxine (SYNTHROID) 50 MCG tablet Take 1 tablet (50 mcg total) by mouth daily before breakfast. 30 tablet 1  .  lisinopril-hydrochlorothiazide (PRINZIDE,ZESTORETIC) 10-12.5 MG tablet Take 1 tablet by mouth daily.    Marland Kitchen loperamide (IMODIUM) 2 MG capsule Take 4 mg by mouth as needed for diarrhea or loose stools.    . prochlorperazine (COMPAZINE) 5 MG tablet Take 1 tablet by mouth every 6 (six) hours as needed.  0  . spironolactone (ALDACTONE) 25 MG tablet Take 25 mg by mouth daily.     No current facility-administered medications for this visit.     SURGICAL HISTORY:  Past Surgical History:  Procedure Laterality Date  . BACK SURGERY  2009   ruptured disc    REVIEW OF SYSTEMS:  A comprehensive review of systems was negative.   PHYSICAL EXAMINATION: General appearance: alert, cooperative and no distress Head: Normocephalic, without obvious abnormality, atraumatic Neck: no adenopathy, no JVD, supple, symmetrical, trachea midline and thyroid not enlarged, symmetric, no tenderness/mass/nodules Lymph nodes: Cervical, supraclavicular, and axillary nodes normal. Resp: clear to auscultation bilaterally Back: symmetric, no curvature. ROM normal. No CVA tenderness. Cardio: regular rate and rhythm, S1, S2 normal, no murmur, click, rub or gallop GI: soft, non-tender; bowel sounds normal; no masses,  no organomegaly Extremities: extremities normal, atraumatic, no cyanosis or edema  ECOG PERFORMANCE STATUS: 1 - Symptomatic but completely ambulatory  Blood pressure (!) 117/58, pulse 63, temperature 98.6 F (37 C), temperature source Oral, resp. rate 17, height 5' 3"  (1.6 m), weight 176 lb 3.2 oz (79.9 kg), SpO2 100 %.  LABORATORY DATA: Lab Results  Component Value Date   WBC 6.3 12/10/2017   HGB 12.0 12/10/2017  HCT 38.4 12/10/2017   MCV 87.5 12/10/2017   PLT 291 12/10/2017      Chemistry      Component Value Date/Time   NA 141 11/25/2017 1200   K 4.3 11/25/2017 1200   CL 107 11/25/2017 1200   CO2 21 (L) 11/25/2017 1200   BUN 16 11/25/2017 1200   CREATININE 1.13 (H) 11/25/2017 1200        Component Value Date/Time   CALCIUM 9.8 11/25/2017 1200   ALKPHOS 127 (H) 11/25/2017 1200   AST 14 (L) 11/25/2017 1200   ALT <6 11/25/2017 1200   BILITOT 0.4 11/25/2017 1200       RADIOGRAPHIC STUDIES: Ct Chest W Contrast  Result Date: 11/23/2017 CLINICAL DATA:  Non-small cell lung cancer, ongoing chemotherapy. Radiation therapy complete. EXAM: CT CHEST WITH CONTRAST TECHNIQUE: Multidetector CT imaging of the chest was performed during intravenous contrast administration. CONTRAST:  64m OMNIPAQUE IOHEXOL 300 MG/ML  SOLN COMPARISON:  08/05/2017. FINDINGS: Cardiovascular: Atherosclerotic calcification of the arterial vasculature, including coronary arteries and aortic valve. Heart size normal. No pericardial effusion. Mediastinum/Nodes: Subcentimeter low-attenuation lesion in the right thyroid is nonspecific. Mediastinal lymph nodes measure up to 10 mm in the low right paratracheal station, previously 12 mm when remeasured. AP window lymph node is minimally smaller as well, 5 mm compared to 7 mm previously. No hilar or axillary adenopathy. Esophagus is grossly unremarkable. Small hiatal hernia. Lungs/Pleura: Apical segment right upper lobe mass measures 3.7 x 4.8 cm, previously 4.2 x 6.1 cm. Mild surrounding ground-glass and smooth septal thickening. Centrilobular emphysema. Focal bullous lesion the anterolateral right mid hemithorax. Subsegmental atelectasis and/or scarring in the right lower lobe. Lungs are otherwise clear. No pleural fluid. Airway is unremarkable. Upper Abdomen: Visualized portion of the liver is unremarkable. There may be slight thickening of the adrenal glands. Visualized portions of the kidneys are unremarkable. There may be vague low-attenuation lesions in the spleen, measuring up to 12 mm, similar. Visualized portions of the pancreas, stomach and bowel are grossly unremarkable. Upper abdominal lymph nodes are not enlarged by CT size criteria. Musculoskeletal: Degenerative  changes in the spine. No worrisome lytic or sclerotic lesions. IMPRESSION: 1. Slight interval decrease in size of a right upper lobe mass. Surrounding septal thickening and ground-glass may be related to radiation therapy. Mediastinal and hilar adenopathy has improved minimally in the interval as well. 2. Aortic atherosclerosis (ICD10-170.0). Coronary artery calcification. 3. Vague low-attenuation lesions in the spleen, similar but indeterminate. Electronically Signed   By: MLorin PicketM.D.   On: 11/23/2017 13:30    ASSESSMENT AND PLAN: This is a very pleasant 62years old [name] with stage IIIB non-small cell lung cancer, adenocarcinoma.   The patient underwent a course of concurrent chemoradiation with weekly carboplatin and paclitaxel status post 7 cycles.  She tolerated this course of treatment fairly well with no concerning complaints.   The patient is currently undergoing treatment with consolidation immunotherapy with Imfinzi (Durvalumab) 10 mg/KG every 2 weeks status post 7 cycles. The patient continues to tolerate this treatment well with no concerning complaints. I recommended for her to proceed with cycle #8 today as scheduled. I will see her back for follow-up visit in 2 weeks for evaluation before starting cycle #9. The patient was advised to call immediately if she has any concerning symptoms in the interval. The patient voices understanding of current disease status and treatment options and is in agreement with the current care plan. All questions were answered. The patient knows  to call the clinic with any problems, questions or concerns. We can certainly see the patient much sooner if necessary.  I spent 10 minutes counseling the patient face to face. The total time spent in the appointment was 15 minutes.  Disclaimer: This note was dictated with voice recognition software. Similar sounding words can inadvertently be transcribed and may not be corrected upon  review.

## 2017-12-10 NOTE — Patient Instructions (Signed)
[facility] Discharge Instructions for Patients Receiving Chemotherapy  Today you received the following chemotherapy agents:  Imfinzi  To help prevent nausea and vomiting after your treatment, we encourage you to take your nausea medication as needed.   If you develop nausea and vomiting that is not controlled by your nausea medication, call the clinic.   BELOW ARE SYMPTOMS THAT SHOULD BE REPORTED IMMEDIATELY:  *FEVER GREATER THAN 100.5 F  *CHILLS WITH OR WITHOUT FEVER  NAUSEA AND VOMITING THAT IS NOT CONTROLLED WITH YOUR NAUSEA MEDICATION  *UNUSUAL SHORTNESS OF BREATH  *UNUSUAL BRUISING OR BLEEDING  TENDERNESS IN MOUTH AND THROAT WITH OR WITHOUT PRESENCE OF ULCERS  *URINARY PROBLEMS  *BOWEL PROBLEMS  UNUSUAL RASH Items with * indicate a potential emergency and should be followed up as soon as possible.  Feel free to call the clinic should you have any questions or concerns. The clinic phone number is [phone].  Please show the [facility] at check-in to the Emergency Department and triage nurse.

## 2017-12-10 NOTE — Telephone Encounter (Signed)
Scheduled appt per 10/24 los - pt to get an updated schedule next visit.

## 2017-12-10 NOTE — Addendum Note (Signed)
Addended by: Jesse Fall on: 12/10/2017 12:15 PM   Modules accepted: Orders

## 2017-12-23 ENCOUNTER — Inpatient Hospital Stay

## 2017-12-23 ENCOUNTER — Encounter: Payer: Self-pay | Admitting: Oncology

## 2017-12-23 ENCOUNTER — Inpatient Hospital Stay (HOSPITAL_BASED_OUTPATIENT_CLINIC_OR_DEPARTMENT_OTHER): Admitting: Oncology

## 2017-12-23 ENCOUNTER — Inpatient Hospital Stay: Attending: Internal Medicine

## 2017-12-23 VITALS — BP 124/74 | HR 69 | Temp 97.7°F | Resp 12 | Ht 63.0 in | Wt 178.0 lb

## 2017-12-23 DIAGNOSIS — I10 Essential (primary) hypertension: Secondary | ICD-10-CM | POA: Insufficient documentation

## 2017-12-23 DIAGNOSIS — Z5112 Encounter for antineoplastic immunotherapy: Secondary | ICD-10-CM | POA: Insufficient documentation

## 2017-12-23 DIAGNOSIS — Z79899 Other long term (current) drug therapy: Secondary | ICD-10-CM | POA: Diagnosis not present

## 2017-12-23 DIAGNOSIS — C3490 Malignant neoplasm of unspecified part of unspecified bronchus or lung: Secondary | ICD-10-CM

## 2017-12-23 DIAGNOSIS — C3411 Malignant neoplasm of upper lobe, right bronchus or lung: Secondary | ICD-10-CM

## 2017-12-23 DIAGNOSIS — Z9221 Personal history of antineoplastic chemotherapy: Secondary | ICD-10-CM | POA: Diagnosis not present

## 2017-12-23 DIAGNOSIS — Z923 Personal history of irradiation: Secondary | ICD-10-CM | POA: Insufficient documentation

## 2017-12-23 DIAGNOSIS — E039 Hypothyroidism, unspecified: Secondary | ICD-10-CM

## 2017-12-23 LAB — CMP (CANCER CENTER ONLY)
ALBUMIN: 3.9 g/dL (ref 3.5–5.0)
ALT: 9 U/L (ref 0–44)
AST: 12 U/L — AB (ref 15–41)
Alkaline Phosphatase: 128 U/L — ABNORMAL HIGH (ref 38–126)
Anion gap: 9 (ref 5–15)
BILIRUBIN TOTAL: 0.2 mg/dL — AB (ref 0.3–1.2)
BUN: 15 mg/dL (ref 8–23)
CO2: 23 mmol/L (ref 22–32)
Calcium: 9.8 mg/dL (ref 8.9–10.3)
Chloride: 107 mmol/L (ref 98–111)
Creatinine: 1.04 mg/dL — ABNORMAL HIGH (ref 0.44–1.00)
GFR, Est AFR Am: >60 mL/min (ref >60)
GFR, Estimated: 56 mL/min — ABNORMAL LOW (ref >60)
GLUCOSE: 92 mg/dL (ref 70–99)
Potassium: 4.6 mmol/L (ref 3.5–5.1)
SODIUM: 139 mmol/L (ref 135–145)
TOTAL PROTEIN: 8.2 g/dL — AB (ref 6.5–8.1)

## 2017-12-23 LAB — CBC WITH DIFFERENTIAL (CANCER CENTER ONLY)
ABS IMMATURE GRANULOCYTES: 0.07 10*3/uL (ref 0.00–0.07)
BASOS ABS: 0.1 10*3/uL (ref 0.0–0.1)
BASOS PCT: 1 %
EOS ABS: 0.2 10*3/uL (ref 0.0–0.5)
Eosinophils Relative: 3 %
HCT: 37.4 % (ref 36.0–46.0)
Hemoglobin: 11.8 g/dL — ABNORMAL LOW (ref 12.0–15.0)
IMMATURE GRANULOCYTES: 1 %
Lymphocytes Relative: 18 %
Lymphs Abs: 1.2 10*3/uL (ref 0.7–4.0)
MCH: 27.3 pg (ref 26.0–34.0)
MCHC: 31.6 g/dL (ref 30.0–36.0)
MCV: 86.6 fL (ref 80.0–100.0)
MONOS PCT: 8 %
Monocytes Absolute: 0.5 10*3/uL (ref 0.1–1.0)
Neutro Abs: 4.7 10*3/uL (ref 1.7–7.7)
Neutrophils Relative %: 69 %
PLATELETS: 285 10*3/uL (ref 150–400)
RBC: 4.32 MIL/uL (ref 3.87–5.11)
RDW: 16 % — AB (ref 11.5–15.5)
WBC Count: 6.8 10*3/uL (ref 4.0–10.5)
nRBC: 0 % (ref 0.0–0.2)

## 2017-12-23 LAB — TSH: TSH: 10.197 u[IU]/mL — ABNORMAL HIGH (ref 0.308–3.960)

## 2017-12-23 MED ORDER — SODIUM CHLORIDE 0.9 % IV SOLN
10.0000 mg/kg | Freq: Once | INTRAVENOUS | Status: AC
  Administered 2017-12-23: 740 mg via INTRAVENOUS
  Filled 2017-12-23: qty 4.8

## 2017-12-23 MED ORDER — SODIUM CHLORIDE 0.9 % IV SOLN
Freq: Once | INTRAVENOUS | Status: AC
  Administered 2017-12-23: 13:00:00 via INTRAVENOUS
  Filled 2017-12-23: qty 250

## 2017-12-23 NOTE — Patient Instructions (Signed)
[facility] Discharge Instructions for Patients Receiving Chemotherapy  Today you received the following chemotherapy agents Durvalumab (IMFINZI).  To help prevent nausea and vomiting after your treatment, we encourage you to take your nausea medication as prescribed.   If you develop nausea and vomiting that is not controlled by your nausea medication, call the clinic.   BELOW ARE SYMPTOMS THAT SHOULD BE REPORTED IMMEDIATELY:  *FEVER GREATER THAN 100.5 F  *CHILLS WITH OR WITHOUT FEVER  NAUSEA AND VOMITING THAT IS NOT CONTROLLED WITH YOUR NAUSEA MEDICATION  *UNUSUAL SHORTNESS OF BREATH  *UNUSUAL BRUISING OR BLEEDING  TENDERNESS IN MOUTH AND THROAT WITH OR WITHOUT PRESENCE OF ULCERS  *URINARY PROBLEMS  *BOWEL PROBLEMS  UNUSUAL RASH Items with * indicate a potential emergency and should be followed up as soon as possible.  Feel free to call the clinic should you have any questions or concerns. The clinic phone number is [phone].  Please show the [facility] at check-in to the Emergency Department and triage nurse.

## 2017-12-23 NOTE — Progress Notes (Signed)
[facility] OFFICE PROGRESS NOTE  [name], [address] [place] [address]  DIAGNOSIS:stageIIIB (T3, N2, M0)non-small cell lung cancer, adenocarcinoma diagnosed in March 2019 and presented with large right upper lobe lung mass in addition to right hilar and mediastinal lymphadenopathy.  PD-L10%  PRIOR THERAPY:Concurrent chemoradiation with weekly carboplatin for an AUC of 2 and paclitaxel 45 mg/m2. First dose expected on 06/01/2017.  Status post 7 cycles.  Last dose was giving 07/14/2017.  CURRENT THERAPY: Consolidation treatment with immunotherapy with Imfinzi (Durvalumab) 10 mg/KG every 2 weeks status post 8 cycles.  INTERVAL HISTORY: Ashley Wilson 62 y.o. female returns for routine follow-up visit by herself.  The patient is feeling fine today and has no specific complaints.  She denies fevers and chills.  Denies chest pain, shortness of breath, cough, hemoptysis.  Denies nausea, vomiting, constipation, diarrhea.  Denies recent weight loss or night sweats.  She has been tolerating her treatment with Imfinzi fairly well.  The patient is here for evaluation prior to starting cycle #9 of her treatment.  MEDICAL HISTORY: Past Medical History:  Diagnosis Date  . Compression fracture of lumbar vertebra ([facility])   . Hypertension   . NSCL ca dx'd 05/2017    ALLERGIES:  has No Known Allergies.  MEDICATIONS:  Current Outpatient Medications  Medication Sig Dispense Refill  . acetaminophen (TYLENOL) 500 MG tablet Take 1,000 mg by mouth every 6 (six) hours as needed for mild pain or moderate pain.    Marland Kitchen amLODipine (NORVASC) 10 MG tablet Take 10 mg by mouth daily.    . clotrimazole-betamethasone (LOTRISONE) cream Apply 1 application topically 2 (two) times daily. 45 g 1  . levothyroxine (SYNTHROID) 50 MCG tablet Take 1 tablet (50 mcg total) by mouth daily before breakfast. 30 tablet 1  . lisinopril-hydrochlorothiazide (PRINZIDE,ZESTORETIC) 10-12.5 MG tablet  Take 1 tablet by mouth daily.    Marland Kitchen loperamide (IMODIUM) 2 MG capsule Take 4 mg by mouth as needed for diarrhea or loose stools.    . prochlorperazine (COMPAZINE) 5 MG tablet Take 1 tablet by mouth every 6 (six) hours as needed.  0  . spironolactone (ALDACTONE) 25 MG tablet Take 25 mg by mouth daily.     No current facility-administered medications for this visit.     SURGICAL HISTORY:  Past Surgical History:  Procedure Laterality Date  . BACK SURGERY  2009   ruptured disc    REVIEW OF SYSTEMS:   Review of Systems  Constitutional: Negative for appetite change, chills, fatigue, fever and unexpected weight change.  HENT:   Negative for mouth sores, nosebleeds, sore throat and trouble swallowing.   Eyes: Negative for eye problems and icterus.  Respiratory: Negative for cough, hemoptysis, shortness of breath and wheezing.   Cardiovascular: Negative for chest pain and leg swelling.  Gastrointestinal: Negative for abdominal pain, constipation, diarrhea, nausea and vomiting.  Genitourinary: Negative for bladder incontinence, difficulty urinating, dysuria, frequency and hematuria.   Musculoskeletal: Negative for back pain, gait problem, neck pain and neck stiffness.  Skin: Negative for itching and rash.  Neurological: Negative for dizziness, extremity weakness, gait problem, headaches, light-headedness and seizures.  Hematological: Negative for adenopathy. Does not bruise/bleed easily.  Psychiatric/Behavioral: Negative for confusion, depression and sleep disturbance. The patient is not nervous/anxious.     PHYSICAL EXAMINATION:  Blood pressure 124/74, pulse 69, temperature 97.7 F (36.5 C), temperature source Oral, resp. rate 12, height 5' 3"  (1.6 m), weight 178 lb (80.7 kg), SpO2 100 %.  ECOG PERFORMANCE STATUS: 1 - Symptomatic but completely ambulatory  Physical Exam  Constitutional: Oriented to person, place, and time and well-developed, well-nourished, and in no distress. No  distress.  HENT:  Head: Normocephalic and atraumatic.  Mouth/Throat: Oropharynx is clear and moist. No oropharyngeal exudate.  Eyes: Conjunctivae are normal. Right eye exhibits no discharge. Left eye exhibits no discharge. No scleral icterus.  Neck: Normal range of motion. Neck supple.  Cardiovascular: Normal rate, regular rhythm, normal heart sounds and intact distal pulses.   Pulmonary/Chest: Effort normal and breath sounds normal. No respiratory distress. No wheezes. No rales.  Abdominal: Soft. Bowel sounds are normal. Exhibits no distension and no mass. There is no tenderness.  Musculoskeletal: Normal range of motion. Exhibits no edema.  Lymphadenopathy:    No cervical adenopathy.  Neurological: Alert and oriented to person, place, and time. Exhibits normal muscle tone. Gait normal. Coordination normal.  Skin: Skin is warm and dry. No rash noted. Not diaphoretic. No erythema. No pallor.  Psychiatric: Mood, memory and judgment normal.  Vitals reviewed.  LABORATORY DATA: Lab Results  Component Value Date   WBC 6.8 12/23/2017   HGB 11.8 (L) 12/23/2017   HCT 37.4 12/23/2017   MCV 86.6 12/23/2017   PLT 285 12/23/2017      Chemistry      Component Value Date/Time   NA 139 12/23/2017 1048   K 4.6 12/23/2017 1048   CL 107 12/23/2017 1048   CO2 23 12/23/2017 1048   BUN 15 12/23/2017 1048   CREATININE 1.04 (H) 12/23/2017 1048      Component Value Date/Time   CALCIUM 9.8 12/23/2017 1048   ALKPHOS 128 (H) 12/23/2017 1048   AST 12 (L) 12/23/2017 1048   ALT 9 12/23/2017 1048   BILITOT 0.2 (L) 12/23/2017 1048       RADIOGRAPHIC STUDIES:  No results found.   ASSESSMENT/PLAN:  Malignant neoplasm of bronchus of right upper lobe [facility]) This is a very pleasant 62 year old [name] with stage IIIB non-small cell lung cancer, adenocarcinoma.   The patient underwent a course of concurrent chemoradiation with weekly carboplatin and paclitaxel status post 7 cycles.   She tolerated this course of treatment fairly well with no concerning complaints.   The patient is currently undergoing treatment with consolidation immunotherapy with Imfinzi (Durvalumab) 10 mg/KG every 2 weeks status post  cycles. The patient continues to tolerate this treatment well with no concerning complaints.  Recommend for her to proceed with cycle #9 of her treatment today as scheduled.  She will follow-up in 2 weeks for evaluation prior to cycle #10.  For hypothyroidism, she will continue on levothyroxine 50 mcg daily.  She has been on this dose for only 3 weeks and will maintain this dose despite the fact that her TSH remains elevated.  We will recheck again in 4 weeks and consider dose change as needed.  The patient was advised to call immediately if she has any concerning symptoms in the interval. The patient voices understanding of current disease status and treatment options and is in agreement with the current care plan. All questions were answered. The patient knows to call the clinic with any problems, questions or concerns. We can certainly see the patient much sooner if necessary.   No orders of the defined types were placed in this encounter.    Mikey Bussing, DNP, AGPCNP-BC, AOCNP 12/23/17

## 2017-12-23 NOTE — Assessment & Plan Note (Addendum)
This is a very pleasant 62 year old [name] with stage IIIB non-small cell lung cancer, adenocarcinoma.   The patient underwent a course of concurrent chemoradiation with weekly carboplatin and paclitaxel status post 7 cycles.  She tolerated this course of treatment fairly well with no concerning complaints.   The patient is currently undergoing treatment with consolidation immunotherapy with Imfinzi (Durvalumab) 10 mg/KG every 2 weeks status post  cycles. The patient continues to tolerate this treatment well with no concerning complaints.  Recommend for her to proceed with cycle #9 of her treatment today as scheduled.  She will follow-up in 2 weeks for evaluation prior to cycle #10.  For hypothyroidism, she will continue on levothyroxine 50 mcg daily.  She has been on this dose for only 3 weeks and will maintain this dose despite the fact that her TSH remains elevated.  We will recheck again in 4 weeks and consider dose change as needed.  The patient was advised to call immediately if she has any concerning symptoms in the interval. The patient voices understanding of current disease status and treatment options and is in agreement with the current care plan. All questions were answered. The patient knows to call the clinic with any problems, questions or concerns. We can certainly see the patient much sooner if necessary.

## 2017-12-24 ENCOUNTER — Telehealth: Payer: Self-pay | Admitting: Oncology

## 2017-12-24 NOTE — Telephone Encounter (Signed)
3 cycles already scheduled per 11/6 los - no additional appts added at the moment

## 2018-01-06 ENCOUNTER — Inpatient Hospital Stay (HOSPITAL_BASED_OUTPATIENT_CLINIC_OR_DEPARTMENT_OTHER): Admitting: Internal Medicine

## 2018-01-06 ENCOUNTER — Telehealth: Payer: Self-pay | Admitting: Internal Medicine

## 2018-01-06 ENCOUNTER — Inpatient Hospital Stay

## 2018-01-06 ENCOUNTER — Encounter: Payer: Self-pay | Admitting: Internal Medicine

## 2018-01-06 VITALS — BP 124/64 | HR 63 | Temp 98.1°F | Resp 17 | Ht 63.0 in | Wt 182.5 lb

## 2018-01-06 DIAGNOSIS — Z9221 Personal history of antineoplastic chemotherapy: Secondary | ICD-10-CM

## 2018-01-06 DIAGNOSIS — Z923 Personal history of irradiation: Secondary | ICD-10-CM

## 2018-01-06 DIAGNOSIS — Z5112 Encounter for antineoplastic immunotherapy: Secondary | ICD-10-CM

## 2018-01-06 DIAGNOSIS — E039 Hypothyroidism, unspecified: Secondary | ICD-10-CM

## 2018-01-06 DIAGNOSIS — C3411 Malignant neoplasm of upper lobe, right bronchus or lung: Secondary | ICD-10-CM

## 2018-01-06 DIAGNOSIS — Z79899 Other long term (current) drug therapy: Secondary | ICD-10-CM

## 2018-01-06 DIAGNOSIS — C3490 Malignant neoplasm of unspecified part of unspecified bronchus or lung: Secondary | ICD-10-CM

## 2018-01-06 DIAGNOSIS — I10 Essential (primary) hypertension: Secondary | ICD-10-CM

## 2018-01-06 LAB — CBC WITH DIFFERENTIAL (CANCER CENTER ONLY)
Abs Immature Granulocytes: 0.06 10*3/uL (ref 0.00–0.07)
Basophils Absolute: 0.1 10*3/uL (ref 0.0–0.1)
Basophils Relative: 1 %
Eosinophils Absolute: 0.1 10*3/uL (ref 0.0–0.5)
Eosinophils Relative: 2 %
HCT: 40.1 % (ref 36.0–46.0)
Hemoglobin: 12.6 g/dL (ref 12.0–15.0)
Immature Granulocytes: 1 %
Lymphocytes Relative: 17 %
Lymphs Abs: 1 10*3/uL (ref 0.7–4.0)
MCH: 27.6 pg (ref 26.0–34.0)
MCHC: 31.4 g/dL (ref 30.0–36.0)
MCV: 87.7 fL (ref 80.0–100.0)
Monocytes Absolute: 0.5 10*3/uL (ref 0.1–1.0)
Monocytes Relative: 9 %
Neutro Abs: 4.2 10*3/uL (ref 1.7–7.7)
Neutrophils Relative %: 70 %
Platelet Count: 302 10*3/uL (ref 150–400)
RBC: 4.57 MIL/uL (ref 3.87–5.11)
RDW: 16.8 % — ABNORMAL HIGH (ref 11.5–15.5)
WBC Count: 5.9 10*3/uL (ref 4.0–10.5)
nRBC: 0 % (ref 0.0–0.2)

## 2018-01-06 LAB — CMP (CANCER CENTER ONLY)
ALT: <6 U/L (ref 0–44)
AST: 11 U/L — ABNORMAL LOW (ref 15–41)
Albumin: 3.7 g/dL (ref 3.5–5.0)
Alkaline Phosphatase: 129 U/L — ABNORMAL HIGH (ref 38–126)
Anion gap: 9 (ref 5–15)
BUN: 15 mg/dL (ref 8–23)
CO2: 25 mmol/L (ref 22–32)
Calcium: 9.6 mg/dL (ref 8.9–10.3)
Chloride: 108 mmol/L (ref 98–111)
Creatinine: 0.91 mg/dL (ref 0.44–1.00)
GFR, Est AFR Am: >60 mL/min (ref >60)
GFR, Estimated: >60 mL/min (ref >60)
Glucose, Bld: 83 mg/dL (ref 70–99)
Potassium: 4.1 mmol/L (ref 3.5–5.1)
Sodium: 142 mmol/L (ref 135–145)
Total Bilirubin: 0.3 mg/dL (ref 0.3–1.2)
Total Protein: 7.9 g/dL (ref 6.5–8.1)

## 2018-01-06 MED ORDER — SODIUM CHLORIDE 0.9 % IV SOLN
Freq: Once | INTRAVENOUS | Status: AC
  Administered 2018-01-06: 13:00:00 via INTRAVENOUS
  Filled 2018-01-06: qty 250

## 2018-01-06 MED ORDER — SODIUM CHLORIDE 0.9 % IV SOLN
10.0000 mg/kg | Freq: Once | INTRAVENOUS | Status: AC
  Administered 2018-01-06: 740 mg via INTRAVENOUS
  Filled 2018-01-06: qty 10

## 2018-01-06 NOTE — Telephone Encounter (Signed)
Scheduled appt per 11/20 los - pt to get an updated schedule in treatment area.

## 2018-01-06 NOTE — Patient Instructions (Signed)
[facility] Discharge Instructions for Patients Receiving Chemotherapy  Today you received the following chemotherapy agents Durvalumab (IMFINZI).  To help prevent nausea and vomiting after your treatment, we encourage you to take your nausea medication as prescribed.   If you develop nausea and vomiting that is not controlled by your nausea medication, call the clinic.   BELOW ARE SYMPTOMS THAT SHOULD BE REPORTED IMMEDIATELY:  *FEVER GREATER THAN 100.5 F  *CHILLS WITH OR WITHOUT FEVER  NAUSEA AND VOMITING THAT IS NOT CONTROLLED WITH YOUR NAUSEA MEDICATION  *UNUSUAL SHORTNESS OF BREATH  *UNUSUAL BRUISING OR BLEEDING  TENDERNESS IN MOUTH AND THROAT WITH OR WITHOUT PRESENCE OF ULCERS  *URINARY PROBLEMS  *BOWEL PROBLEMS  UNUSUAL RASH Items with * indicate a potential emergency and should be followed up as soon as possible.  Feel free to call the clinic should you have any questions or concerns. The clinic phone number is [phone].  Please show the [facility] at check-in to the Emergency Department and triage nurse.

## 2018-01-06 NOTE — Progress Notes (Signed)
[facility] Telephone:(336) [phone]   Fax:(336) [phone]  OFFICE PROGRESS NOTE  [name], MD [address] [place] [address]  DIAGNOSIS: stageIIIB (T3, N2, M0)non-small cell lung cancer, adenocarcinoma diagnosed in March 2019 and presented with large right upper lobe lung mass in addition to right hilar and mediastinal lymphadenopathy.  PD-L1 0%  PRIOR THERAPY: Concurrent chemoradiation with weekly carboplatin for an AUC of 2 and paclitaxel 45 mg/m2.  First dose expected on 06/01/2017.  Status post 7 cycles.  Last dose was giving 07/14/2017.  CURRENT THERAPY:  Consolidation treatment with immunotherapy with Imfinzi (Durvalumab) 10 mg/KG every 2 weeks status post 9 cycles..  INTERVAL HISTORY: Ashley Wilson 62 y.o. female returns to the clinic today for follow-up visit.  The patient is tolerating her current treatment with Imfinzi fairly well.  She denied having any skin rash or diarrhea.  She denied having any chest pain, shortness of breath, cough or hemoptysis.  She has no weight loss or night sweats.  She has no nausea, vomiting, diarrhea or constipation.  She is actually feeling very well these days.  She is here today for evaluation before starting cycle #10.  MEDICAL HISTORY: Past Medical History:  Diagnosis Date  . Compression fracture of lumbar vertebra ([place])   . Hypertension   . NSCL ca dx'd 05/2017    ALLERGIES:  has No Known Allergies.  MEDICATIONS:  Current Outpatient Medications  Medication Sig Dispense Refill  . acetaminophen (TYLENOL) 500 MG tablet Take 1,000 mg by mouth every 6 (six) hours as needed for mild pain or moderate pain.    Marland Kitchen amLODipine (NORVASC) 10 MG tablet Take 10 mg by mouth daily.    . clotrimazole-betamethasone (LOTRISONE) cream Apply 1 application topically 2 (two) times daily. 45 g 1  . levothyroxine (SYNTHROID) 50 MCG tablet Take 1 tablet (50 mcg total) by mouth daily before breakfast. 30 tablet 1  .  lisinopril-hydrochlorothiazide (PRINZIDE,ZESTORETIC) 10-12.5 MG tablet Take 1 tablet by mouth daily.    Marland Kitchen loperamide (IMODIUM) 2 MG capsule Take 4 mg by mouth as needed for diarrhea or loose stools.    . prochlorperazine (COMPAZINE) 5 MG tablet Take 1 tablet by mouth every 6 (six) hours as needed.  0  . spironolactone (ALDACTONE) 25 MG tablet Take 25 mg by mouth daily.     No current facility-administered medications for this visit.     SURGICAL HISTORY:  Past Surgical History:  Procedure Laterality Date  . BACK SURGERY  2009   ruptured disc    REVIEW OF SYSTEMS:  A comprehensive review of systems was negative.   PHYSICAL EXAMINATION: General appearance: alert, cooperative and no distress Head: Normocephalic, without obvious abnormality, atraumatic Neck: no adenopathy, no JVD, supple, symmetrical, trachea midline and thyroid not enlarged, symmetric, no tenderness/mass/nodules Lymph nodes: Cervical, supraclavicular, and axillary nodes normal. Resp: clear to auscultation bilaterally Back: symmetric, no curvature. ROM normal. No CVA tenderness. Cardio: regular rate and rhythm, S1, S2 normal, no murmur, click, rub or gallop GI: soft, non-tender; bowel sounds normal; no masses,  no organomegaly Extremities: extremities normal, atraumatic, no cyanosis or edema  ECOG PERFORMANCE STATUS: 1 - Symptomatic but completely ambulatory  Blood pressure 124/64, pulse 63, temperature 98.1 F (36.7 C), temperature source Oral, resp. rate 17, height 5' 3"  (1.6 m), weight 182 lb 8 oz (82.8 kg), SpO2 100 %.  LABORATORY DATA: Lab Results  Component Value Date   WBC 5.9 01/06/2018   HGB  12.6 01/06/2018   HCT 40.1 01/06/2018   MCV 87.7 01/06/2018   PLT 302 01/06/2018      Chemistry      Component Value Date/Time   NA 142 01/06/2018 1125   K 4.1 01/06/2018 1125   CL 108 01/06/2018 1125   CO2 25 01/06/2018 1125   BUN 15 01/06/2018 1125   CREATININE 0.91 01/06/2018 1125      Component Value  Date/Time   CALCIUM 9.6 01/06/2018 1125   ALKPHOS 129 (H) 01/06/2018 1125   AST 11 (L) 01/06/2018 1125   ALT <6 01/06/2018 1125   BILITOT 0.3 01/06/2018 1125       RADIOGRAPHIC STUDIES: No results found.  ASSESSMENT AND PLAN: This is a very pleasant 62 years old [name] with stage IIIB non-small cell lung cancer, adenocarcinoma.   The patient underwent a course of concurrent chemoradiation with weekly carboplatin and paclitaxel status post 7 cycles.  She tolerated this course of treatment fairly well with no concerning complaints.   The patient is currently undergoing treatment with consolidation immunotherapy with Imfinzi (Durvalumab) 10 mg/KG every 2 weeks status post 9 cycles. The patient continues to tolerate this treatment fairly well with no concerning adverse effects. I recommended for her to proceed with cycle #10 today as scheduled. She will come back for follow-up visit in 2 weeks for evaluation before starting cycle #11. She was advised to call immediately if she has any concerning symptoms in the interval. The patient voices understanding of current disease status and treatment options and is in agreement with the current care plan. All questions were answered. The patient knows to call the clinic with any problems, questions or concerns. We can certainly see the patient much sooner if necessary.  I spent 10 minutes counseling the patient face to face. The total time spent in the appointment was 15 minutes.  Disclaimer: This note was dictated with voice recognition software. Similar sounding words can inadvertently be transcribed and may not be corrected upon review.

## 2018-01-20 ENCOUNTER — Inpatient Hospital Stay

## 2018-01-20 ENCOUNTER — Inpatient Hospital Stay (HOSPITAL_BASED_OUTPATIENT_CLINIC_OR_DEPARTMENT_OTHER): Admitting: Internal Medicine

## 2018-01-20 ENCOUNTER — Telehealth: Payer: Self-pay | Admitting: Internal Medicine

## 2018-01-20 ENCOUNTER — Inpatient Hospital Stay: Attending: Internal Medicine

## 2018-01-20 ENCOUNTER — Encounter: Payer: Self-pay | Admitting: Internal Medicine

## 2018-01-20 ENCOUNTER — Other Ambulatory Visit: Payer: Self-pay

## 2018-01-20 VITALS — BP 126/76 | HR 72 | Temp 98.2°F | Resp 18 | Ht 63.0 in | Wt 181.1 lb

## 2018-01-20 DIAGNOSIS — Z9221 Personal history of antineoplastic chemotherapy: Secondary | ICD-10-CM | POA: Diagnosis not present

## 2018-01-20 DIAGNOSIS — Z79899 Other long term (current) drug therapy: Secondary | ICD-10-CM

## 2018-01-20 DIAGNOSIS — Z5112 Encounter for antineoplastic immunotherapy: Secondary | ICD-10-CM | POA: Diagnosis not present

## 2018-01-20 DIAGNOSIS — Z923 Personal history of irradiation: Secondary | ICD-10-CM | POA: Insufficient documentation

## 2018-01-20 DIAGNOSIS — I10 Essential (primary) hypertension: Secondary | ICD-10-CM

## 2018-01-20 DIAGNOSIS — R7989 Other specified abnormal findings of blood chemistry: Secondary | ICD-10-CM | POA: Diagnosis not present

## 2018-01-20 DIAGNOSIS — C3411 Malignant neoplasm of upper lobe, right bronchus or lung: Secondary | ICD-10-CM

## 2018-01-20 DIAGNOSIS — E039 Hypothyroidism, unspecified: Secondary | ICD-10-CM | POA: Diagnosis not present

## 2018-01-20 DIAGNOSIS — C3490 Malignant neoplasm of unspecified part of unspecified bronchus or lung: Secondary | ICD-10-CM

## 2018-01-20 LAB — TSH: TSH: 8.227 u[IU]/mL — ABNORMAL HIGH (ref 0.308–3.960)

## 2018-01-20 LAB — CBC WITH DIFFERENTIAL (CANCER CENTER ONLY)
Abs Immature Granulocytes: 0.05 10*3/uL (ref 0.00–0.07)
BASOS PCT: 1 %
Basophils Absolute: 0.1 10*3/uL (ref 0.0–0.1)
EOS PCT: 4 %
Eosinophils Absolute: 0.3 10*3/uL (ref 0.0–0.5)
HCT: 37 % (ref 36.0–46.0)
HEMOGLOBIN: 12 g/dL (ref 12.0–15.0)
Immature Granulocytes: 1 %
LYMPHS PCT: 16 %
Lymphs Abs: 1 10*3/uL (ref 0.7–4.0)
MCH: 28 pg (ref 26.0–34.0)
MCHC: 32.4 g/dL (ref 30.0–36.0)
MCV: 86.2 fL (ref 80.0–100.0)
Monocytes Absolute: 0.6 10*3/uL (ref 0.1–1.0)
Monocytes Relative: 9 %
Neutro Abs: 4.4 10*3/uL (ref 1.7–7.7)
Neutrophils Relative %: 69 %
PLATELETS: 288 10*3/uL (ref 150–400)
RBC: 4.29 MIL/uL (ref 3.87–5.11)
RDW: 16.7 % — AB (ref 11.5–15.5)
WBC: 6.4 10*3/uL (ref 4.0–10.5)
nRBC: 0 % (ref 0.0–0.2)

## 2018-01-20 LAB — CMP (CANCER CENTER ONLY)
ALT: 7 U/L (ref 0–44)
AST: 12 U/L — AB (ref 15–41)
Albumin: 3.8 g/dL (ref 3.5–5.0)
Alkaline Phosphatase: 118 U/L (ref 38–126)
Anion gap: 10 (ref 5–15)
BILIRUBIN TOTAL: 0.2 mg/dL — AB (ref 0.3–1.2)
BUN: 32 mg/dL — ABNORMAL HIGH (ref 8–23)
CO2: 23 mmol/L (ref 22–32)
CREATININE: 1.11 mg/dL — AB (ref 0.44–1.00)
Calcium: 9.9 mg/dL (ref 8.9–10.3)
Chloride: 108 mmol/L (ref 98–111)
GFR, EST NON AFRICAN AMERICAN: 52 mL/min — AB (ref >60)
Glucose, Bld: 91 mg/dL (ref 70–99)
Potassium: 4.2 mmol/L (ref 3.5–5.1)
Sodium: 141 mmol/L (ref 135–145)
TOTAL PROTEIN: 7.9 g/dL (ref 6.5–8.1)

## 2018-01-20 MED ORDER — SODIUM CHLORIDE 0.9 % IV SOLN
Freq: Once | INTRAVENOUS | Status: AC
  Administered 2018-01-20: 10:00:00 via INTRAVENOUS
  Filled 2018-01-20: qty 250

## 2018-01-20 MED ORDER — SODIUM CHLORIDE 0.9 % IV SOLN
10.0000 mg/kg | Freq: Once | INTRAVENOUS | Status: AC
  Administered 2018-01-20: 740 mg via INTRAVENOUS
  Filled 2018-01-20: qty 10

## 2018-01-20 MED ORDER — LEVOTHYROXINE SODIUM 50 MCG PO TABS: 50.0000 ug | ORAL_TABLET | Freq: Every day | ORAL | 1 refills | Status: DC

## 2018-01-20 NOTE — Progress Notes (Signed)
[facility] Telephone:([phone]   Fax:(336) [phone]  OFFICE PROGRESS NOTE  [name], MD [address] [place] [address]  DIAGNOSIS: stageIIIB (T3, N2, M0)non-small cell lung cancer, adenocarcinoma diagnosed in March 2019 and presented with large right upper lobe lung mass in addition to right hilar and mediastinal lymphadenopathy.  PD-L1 0%  PRIOR THERAPY: Concurrent chemoradiation with weekly carboplatin for an AUC of 2 and paclitaxel 45 mg/m2.  First dose expected on 06/01/2017.  Status post 7 cycles.  Last dose was giving 07/14/2017.  CURRENT THERAPY:  Consolidation treatment with immunotherapy with Imfinzi (Durvalumab) 10 mg/KG every 2 weeks status post 10 cycles..  INTERVAL HISTORY: Ashley Wilson 62 y.o. female returns to the clinic today for follow-up visit.  The patient is feeling fine today with no concerning complaints.  She enjoyed her Thanksgiving holiday with the family.  She denied having any chest pain, shortness of breath, cough or hemoptysis.  She denied having any fever or chills.  She has no nausea, vomiting, diarrhea or constipation.  She has no skin rash.  She is here today for evaluation before starting cycle #11.  MEDICAL HISTORY: Past Medical History:  Diagnosis Date  . Compression fracture of lumbar vertebra ([place])   . Hypertension   . NSCL ca dx'd 05/2017    ALLERGIES:  has No Known Allergies.  MEDICATIONS:  Current Outpatient Medications  Medication Sig Dispense Refill  . acetaminophen (TYLENOL) 500 MG tablet Take 1,000 mg by mouth every 6 (six) hours as needed for mild pain or moderate pain.    Marland Kitchen amLODipine (NORVASC) 10 MG tablet Take 10 mg by mouth daily.    . clotrimazole-betamethasone (LOTRISONE) cream Apply 1 application topically 2 (two) times daily. 45 g 1  . levothyroxine (SYNTHROID) 50 MCG tablet Take 1 tablet (50 mcg total) by mouth daily before breakfast. 30 tablet 1  . lisinopril-hydrochlorothiazide  (PRINZIDE,ZESTORETIC) 10-12.5 MG tablet Take 1 tablet by mouth daily.    Marland Kitchen loperamide (IMODIUM) 2 MG capsule Take 4 mg by mouth as needed for diarrhea or loose stools.    . prochlorperazine (COMPAZINE) 5 MG tablet Take 1 tablet by mouth every 6 (six) hours as needed.  0  . spironolactone (ALDACTONE) 25 MG tablet Take 25 mg by mouth daily.     No current facility-administered medications for this visit.     SURGICAL HISTORY:  Past Surgical History:  Procedure Laterality Date  . BACK SURGERY  2009   ruptured disc    REVIEW OF SYSTEMS:  A comprehensive review of systems was negative.   PHYSICAL EXAMINATION: General appearance: alert, cooperative and no distress Head: Normocephalic, without obvious abnormality, atraumatic Neck: no adenopathy, no JVD, supple, symmetrical, trachea midline and thyroid not enlarged, symmetric, no tenderness/mass/nodules Lymph nodes: Cervical, supraclavicular, and axillary nodes normal. Resp: clear to auscultation bilaterally Back: symmetric, no curvature. ROM normal. No CVA tenderness. Cardio: regular rate and rhythm, S1, S2 normal, no murmur, click, rub or gallop GI: soft, non-tender; bowel sounds normal; no masses,  no organomegaly Extremities: extremities normal, atraumatic, no cyanosis or edema  ECOG PERFORMANCE STATUS: 1 - Symptomatic but completely ambulatory  Blood pressure 126/76, pulse 72, temperature 98.2 F (36.8 C), temperature source Oral, resp. rate 18, height 5' 3"  (1.6 m), weight 181 lb 1.6 oz (82.1 kg), SpO2 100 %.  LABORATORY DATA: Lab Results  Component Value Date   WBC 6.4 01/20/2018   HGB 12.0 01/20/2018   HCT  37.0 01/20/2018   MCV 86.2 01/20/2018   PLT 288 01/20/2018      Chemistry      Component Value Date/Time   NA 141 01/20/2018 0901   K 4.2 01/20/2018 0901   CL 108 01/20/2018 0901   CO2 23 01/20/2018 0901   BUN 32 (H) 01/20/2018 0901   CREATININE 1.11 (H) 01/20/2018 0901      Component Value Date/Time   CALCIUM  9.9 01/20/2018 0901   ALKPHOS 118 01/20/2018 0901   AST 12 (L) 01/20/2018 0901   ALT 7 01/20/2018 0901   BILITOT 0.2 (L) 01/20/2018 0901       RADIOGRAPHIC STUDIES: No results found.  ASSESSMENT AND PLAN: This is a very pleasant 62 years old [name] with stage IIIB non-small cell lung cancer, adenocarcinoma.   The patient underwent a course of concurrent chemoradiation with weekly carboplatin and paclitaxel status post 7 cycles.  She tolerated this course of treatment fairly well with no concerning complaints.   The patient is currently undergoing treatment with consolidation immunotherapy with Imfinzi (Durvalumab) 10 mg/KG every 2 weeks status post 10 cycles. The patient continues to tolerate this treatment well with no concerning adverse effects. I recommended for her to proceed with cycle #11 today as scheduled. I will see her back for follow-up visit in 2 weeks for evaluation before starting cycle #12. For the hypothyroidism, I gave the patient refill of her levothyroxine. The patient voices understanding of current disease status and treatment options and is in agreement with the current care plan. All questions were answered. The patient knows to call the clinic with any problems, questions or concerns. We can certainly see the patient much sooner if necessary.  I spent 10 minutes counseling the patient face to face. The total time spent in the appointment was 15 minutes.  Disclaimer: This note was dictated with voice recognition software. Similar sounding words can inadvertently be transcribed and may not be corrected upon review.

## 2018-01-20 NOTE — Patient Instructions (Signed)
[facility] Discharge Instructions for Patients Receiving Chemotherapy  Today you received the following chemotherapy agents Durvalumab (IMFINZI).  To help prevent nausea and vomiting after your treatment, we encourage you to take your nausea medication as prescribed.   If you develop nausea and vomiting that is not controlled by your nausea medication, call the clinic.   BELOW ARE SYMPTOMS THAT SHOULD BE REPORTED IMMEDIATELY:  *FEVER GREATER THAN 100.5 F  *CHILLS WITH OR WITHOUT FEVER  NAUSEA AND VOMITING THAT IS NOT CONTROLLED WITH YOUR NAUSEA MEDICATION  *UNUSUAL SHORTNESS OF BREATH  *UNUSUAL BRUISING OR BLEEDING  TENDERNESS IN MOUTH AND THROAT WITH OR WITHOUT PRESENCE OF ULCERS  *URINARY PROBLEMS  *BOWEL PROBLEMS  UNUSUAL RASH Items with * indicate a potential emergency and should be followed up as soon as possible.  Feel free to call the clinic should you have any questions or concerns. The clinic phone number is [phone].  Please show the [facility] at check-in to the Emergency Department and triage nurse.

## 2018-01-20 NOTE — Telephone Encounter (Signed)
3 cycles already scheduled per 12/4 los.

## 2018-02-02 ENCOUNTER — Other Ambulatory Visit: Payer: Self-pay | Admitting: Internal Medicine

## 2018-02-02 DIAGNOSIS — R7989 Other specified abnormal findings of blood chemistry: Secondary | ICD-10-CM

## 2018-02-03 ENCOUNTER — Inpatient Hospital Stay

## 2018-02-03 ENCOUNTER — Encounter: Payer: Self-pay | Admitting: Oncology

## 2018-02-03 ENCOUNTER — Inpatient Hospital Stay (HOSPITAL_BASED_OUTPATIENT_CLINIC_OR_DEPARTMENT_OTHER): Admitting: Oncology

## 2018-02-03 VITALS — BP 138/71 | HR 76 | Temp 97.7°F | Resp 16 | Ht 63.0 in | Wt 187.5 lb

## 2018-02-03 DIAGNOSIS — C3411 Malignant neoplasm of upper lobe, right bronchus or lung: Secondary | ICD-10-CM | POA: Diagnosis not present

## 2018-02-03 DIAGNOSIS — E032 Hypothyroidism due to medicaments and other exogenous substances: Secondary | ICD-10-CM

## 2018-02-03 DIAGNOSIS — Z5112 Encounter for antineoplastic immunotherapy: Secondary | ICD-10-CM

## 2018-02-03 DIAGNOSIS — Z79899 Other long term (current) drug therapy: Secondary | ICD-10-CM

## 2018-02-03 DIAGNOSIS — Z923 Personal history of irradiation: Secondary | ICD-10-CM

## 2018-02-03 DIAGNOSIS — C3490 Malignant neoplasm of unspecified part of unspecified bronchus or lung: Secondary | ICD-10-CM

## 2018-02-03 DIAGNOSIS — I10 Essential (primary) hypertension: Secondary | ICD-10-CM

## 2018-02-03 DIAGNOSIS — Z9221 Personal history of antineoplastic chemotherapy: Secondary | ICD-10-CM | POA: Diagnosis not present

## 2018-02-03 DIAGNOSIS — E039 Hypothyroidism, unspecified: Secondary | ICD-10-CM | POA: Insufficient documentation

## 2018-02-03 LAB — CMP (CANCER CENTER ONLY)
ALT: 10 U/L (ref 0–44)
AST: 12 U/L — ABNORMAL LOW (ref 15–41)
Albumin: 3.5 g/dL (ref 3.5–5.0)
Alkaline Phosphatase: 109 U/L (ref 38–126)
Anion gap: 9 (ref 5–15)
BUN: 14 mg/dL (ref 8–23)
CO2: 23 mmol/L (ref 22–32)
Calcium: 9.2 mg/dL (ref 8.9–10.3)
Chloride: 109 mmol/L (ref 98–111)
Creatinine: 0.94 mg/dL (ref 0.44–1.00)
GFR, Est AFR Am: >60 mL/min (ref >60)
GFR, Estimated: >60 mL/min (ref >60)
GLUCOSE: 89 mg/dL (ref 70–99)
Potassium: 4.1 mmol/L (ref 3.5–5.1)
Sodium: 141 mmol/L (ref 135–145)
Total Bilirubin: 0.3 mg/dL (ref 0.3–1.2)
Total Protein: 7.3 g/dL (ref 6.5–8.1)

## 2018-02-03 LAB — CBC WITH DIFFERENTIAL (CANCER CENTER ONLY)
Abs Immature Granulocytes: 0.03 10*3/uL (ref 0.00–0.07)
Basophils Absolute: 0 10*3/uL (ref 0.0–0.1)
Basophils Relative: 1 %
Eosinophils Absolute: 0.3 10*3/uL (ref 0.0–0.5)
Eosinophils Relative: 6 %
HEMATOCRIT: 36.6 % (ref 36.0–46.0)
Hemoglobin: 11.5 g/dL — ABNORMAL LOW (ref 12.0–15.0)
Immature Granulocytes: 1 %
Lymphocytes Relative: 18 %
Lymphs Abs: 0.9 10*3/uL (ref 0.7–4.0)
MCH: 27.6 pg (ref 26.0–34.0)
MCHC: 31.4 g/dL (ref 30.0–36.0)
MCV: 87.8 fL (ref 80.0–100.0)
Monocytes Absolute: 0.4 10*3/uL (ref 0.1–1.0)
Monocytes Relative: 9 %
Neutro Abs: 3.3 10*3/uL (ref 1.7–7.7)
Neutrophils Relative %: 65 %
Platelet Count: 239 10*3/uL (ref 150–400)
RBC: 4.17 MIL/uL (ref 3.87–5.11)
RDW: 16.8 % — ABNORMAL HIGH (ref 11.5–15.5)
WBC Count: 5 10*3/uL (ref 4.0–10.5)
nRBC: 0 % (ref 0.0–0.2)

## 2018-02-03 MED ORDER — SODIUM CHLORIDE 0.9 % IV SOLN
10.0000 mg/kg | Freq: Once | INTRAVENOUS | Status: AC
  Administered 2018-02-03: 860 mg via INTRAVENOUS
  Filled 2018-02-03: qty 10

## 2018-02-03 MED ORDER — SODIUM CHLORIDE 0.9 % IV SOLN
10.0000 mg/kg | Freq: Once | INTRAVENOUS | Status: DC
  Filled 2018-02-03: qty 14.8

## 2018-02-03 MED ORDER — SODIUM CHLORIDE 0.9 % IV SOLN
Freq: Once | INTRAVENOUS | Status: AC
  Administered 2018-02-03: 10:00:00 via INTRAVENOUS
  Filled 2018-02-03: qty 250

## 2018-02-03 NOTE — Progress Notes (Signed)
[facility] OFFICE PROGRESS NOTE  [name], [address] [place] [address]  DIAGNOSIS:stageIIIB (T3, N2, M0)non-small cell lung cancer, adenocarcinoma diagnosed in March 2019 and presented with large right upper lobe lung mass in addition to right hilar and mediastinal lymphadenopathy.  PD-L10%  PRIOR THERAPY:Concurrent chemoradiation with weekly carboplatin for an AUC of 2 and paclitaxel 45 mg/m2. First dose expected on 06/01/2017.  Status post 7 cycles.  Last dose was giving 07/14/2017.  CURRENT THERAPY: Consolidation treatment with immunotherapy with Imfinzi (Durvalumab) 10 mg/KG every 2 weeks status post 11 cycles.  INTERVAL HISTORY: BREEZY HERTENSTEIN 62 y.o. female returns for routine follow-up visit by herself.  The patient is feeling fine today and has no specific complaints.  She denies fevers and chills.  Denies chest pain, shortness of breath, cough, hemoptysis.  Denies nausea, vomiting, constipation, diarrhea.  Denies recent weight loss or night sweats.  She has been tolerating her treatment with Imfinzi fairly well.  The patient is here for evaluation prior to cycle #12 of Imfinzi.  MEDICAL HISTORY: Past Medical History:  Diagnosis Date  . Compression fracture of lumbar vertebra ([place])   . Hypertension   . NSCL ca dx'd 05/2017    ALLERGIES:  has No Known Allergies.  MEDICATIONS:  Current Outpatient Medications  Medication Sig Dispense Refill  . acetaminophen (TYLENOL) 500 MG tablet Take 1,000 mg by mouth every 6 (six) hours as needed for mild pain or moderate pain.    Marland Kitchen amLODipine (NORVASC) 10 MG tablet Take 10 mg by mouth daily.    . clotrimazole-betamethasone (LOTRISONE) cream Apply 1 application topically 2 (two) times daily. 45 g 1  . levothyroxine (SYNTHROID) 50 MCG tablet Take 1 tablet (50 mcg total) by mouth daily before breakfast. 30 tablet 1  . lisinopril-hydrochlorothiazide (PRINZIDE,ZESTORETIC) 10-12.5 MG tablet Take 1 tablet by  mouth daily.    Marland Kitchen loperamide (IMODIUM) 2 MG capsule Take 4 mg by mouth as needed for diarrhea or loose stools.    Marland Kitchen spironolactone (ALDACTONE) 25 MG tablet Take 25 mg by mouth daily.    . prochlorperazine (COMPAZINE) 5 MG tablet Take 1 tablet by mouth every 6 (six) hours as needed.  0   No current facility-administered medications for this visit.     SURGICAL HISTORY:  Past Surgical History:  Procedure Laterality Date  . BACK SURGERY  2009   ruptured disc    REVIEW OF SYSTEMS:   Review of Systems  Constitutional: Negative for appetite change, chills, fatigue, fever and unexpected weight change.  HENT:   Negative for mouth sores, nosebleeds, sore throat and trouble swallowing.   Eyes: Negative for eye problems and icterus.  Respiratory: Negative for cough, hemoptysis, shortness of breath and wheezing.   Cardiovascular: Negative for chest pain and leg swelling.  Gastrointestinal: Negative for abdominal pain, constipation, diarrhea, nausea and vomiting.  Genitourinary: Negative for bladder incontinence, difficulty urinating, dysuria, frequency and hematuria.   Musculoskeletal: Negative for back pain, gait problem, neck pain and neck stiffness.  Skin: Negative for itching and rash.  Neurological: Negative for dizziness, extremity weakness, gait problem, headaches, light-headedness and seizures.  Hematological: Negative for adenopathy. Does not bruise/bleed easily.  Psychiatric/Behavioral: Negative for confusion, depression and sleep disturbance. The patient is not nervous/anxious.     PHYSICAL EXAMINATION:  Blood pressure 138/71, pulse 76, temperature 97.7 F (36.5 C), temperature source Oral, resp. rate 16, height 5' 3"  (1.6 m), weight 187 lb 8 oz (85 kg), SpO2 99 %.  ECOG PERFORMANCE STATUS: 1 - Symptomatic but completely ambulatory  Physical Exam  Constitutional: Oriented to person, place, and time and well-developed, well-nourished, and in no distress. No distress.  HENT:   Head: Normocephalic and atraumatic.  Mouth/Throat: Oropharynx is clear and moist. No oropharyngeal exudate.  Eyes: Conjunctivae are normal. Right eye exhibits no discharge. Left eye exhibits no discharge. No scleral icterus.  Neck: Normal range of motion. Neck supple.  Cardiovascular: Normal rate, regular rhythm, normal heart sounds and intact distal pulses.   Pulmonary/Chest: Effort normal and breath sounds normal. No respiratory distress. No wheezes. No rales.  Abdominal: Soft. Bowel sounds are normal. Exhibits no distension and no mass. There is no tenderness.  Musculoskeletal: Normal range of motion. Exhibits no edema.  Lymphadenopathy:    No cervical adenopathy.  Neurological: Alert and oriented to person, place, and time. Exhibits normal muscle tone. Gait normal. Coordination normal.  Skin: Skin is warm and dry. No rash noted. Not diaphoretic. No erythema. No pallor.  Psychiatric: Mood, memory and judgment normal.  Vitals reviewed.  LABORATORY DATA: Lab Results  Component Value Date   WBC 5.0 02/03/2018   HGB 11.5 (L) 02/03/2018   HCT 36.6 02/03/2018   MCV 87.8 02/03/2018   PLT 239 02/03/2018      Chemistry      Component Value Date/Time   NA 141 01/20/2018 0901   K 4.2 01/20/2018 0901   CL 108 01/20/2018 0901   CO2 23 01/20/2018 0901   BUN 32 (H) 01/20/2018 0901   CREATININE 1.11 (H) 01/20/2018 0901      Component Value Date/Time   CALCIUM 9.9 01/20/2018 0901   ALKPHOS 118 01/20/2018 0901   AST 12 (L) 01/20/2018 0901   ALT 7 01/20/2018 0901   BILITOT 0.2 (L) 01/20/2018 0901       RADIOGRAPHIC STUDIES:  No results found.   ASSESSMENT/PLAN:  Malignant neoplasm of bronchus of right upper lobe [facility]) This is a very pleasant 62 year old [name] with stage IIIB non-small cell lung cancer, adenocarcinoma.   The patient underwent a course of concurrent chemoradiation with weekly carboplatin and paclitaxel status post 7 cycles.  She tolerated this  course of treatment fairly well with no concerning complaints.   The patient is currently undergoing treatment with consolidation immunotherapy with Imfinzi (Durvalumab) 10 mg/KG every 2 weeks status post 11 cycles. The patient continues to tolerate this treatment well with no concerning adverse effects. Recommend for the patient to proceed with cycle #12 of her treatment today as scheduled.  She will have a restaging CT scan of the chest prior to her next visit.  She will follow-up in 2 weeks for evaluation prior to cycle #13 and to review her restaging CT scan results.  For the hypothyroidism, she will continue levothyroxine  The patient voices understanding of current disease status and treatment options and is in agreement with the current care plan. All questions were answered. The patient knows to call the clinic with any problems, questions or concerns. We can certainly see the patient much sooner if necessary.   Orders Placed This Encounter  Procedures  . CT CHEST W CONTRAST    Standing Status:   Future    Standing Expiration Date:   02/04/2019    Order Specific Question:   If indicated for the ordered procedure, I authorize the administration of contrast media per Radiology protocol    Answer:   Yes    Order Specific Question:   Preferred imaging location?  Answer:   [facility]    Order Specific Question:   Radiology Contrast Protocol - do NOT remove file path    Answer:   [email or web address]    Order Specific Question:   ** REASON FOR EXAM (FREE TEXT)    Answer:   Lung cancer. Restaging.  Marland Kitchen CBC with Differential (Cancer Center Only)    Standing Status:   Standing    Number of Occurrences:   20    Standing Expiration Date:   02/04/2019  . CMP ([facility] only)    Standing Status:   Standing    Number of Occurrences:   20    Standing Expiration Date:   02/04/2019     Mikey Bussing, DNP, AGPCNP-BC, AOCNP 02/03/18

## 2018-02-03 NOTE — Assessment & Plan Note (Addendum)
This is a very pleasant 62 year old [name] with stage IIIB non-small cell lung cancer, adenocarcinoma.   The patient underwent a course of concurrent chemoradiation with weekly carboplatin and paclitaxel status post 7 cycles.  She tolerated this course of treatment fairly well with no concerning complaints.   The patient is currently undergoing treatment with consolidation immunotherapy with Imfinzi (Durvalumab) 10 mg/KG every 2 weeks status post 11 cycles. The patient continues to tolerate this treatment well with no concerning adverse effects. Recommend for the patient to proceed with cycle #12 of her treatment today as scheduled.  She will have a restaging CT scan of the chest prior to her next visit.  She will follow-up in 2 weeks for evaluation prior to cycle #13 and to review her restaging CT scan results.  For the hypothyroidism, she will continue levothyroxine  The patient voices understanding of current disease status and treatment options and is in agreement with the current care plan. All questions were answered. The patient knows to call the clinic with any problems, questions or concerns. We can certainly see the patient much sooner if necessary.

## 2018-02-03 NOTE — Patient Instructions (Signed)
[facility] Discharge Instructions for Patients Receiving Chemotherapy  Today you received the following chemotherapy agents: Durvalumab (Imfinzi)   To help prevent nausea and vomiting after your treatment, we encourage you to take your nausea medication as directed.   If you develop nausea and vomiting that is not controlled by your nausea medication, call the clinic.   BELOW ARE SYMPTOMS THAT SHOULD BE REPORTED IMMEDIATELY:  *FEVER GREATER THAN 100.5 F  *CHILLS WITH OR WITHOUT FEVER  NAUSEA AND VOMITING THAT IS NOT CONTROLLED WITH YOUR NAUSEA MEDICATION  *UNUSUAL SHORTNESS OF BREATH  *UNUSUAL BRUISING OR BLEEDING  TENDERNESS IN MOUTH AND THROAT WITH OR WITHOUT PRESENCE OF ULCERS  *URINARY PROBLEMS  *BOWEL PROBLEMS  UNUSUAL RASH Items with * indicate a potential emergency and should be followed up as soon as possible.  Feel free to call the clinic should you have any questions or concerns. The clinic phone number is [phone].  Please show the CHEMO ALERT CARD at check-in to the Emergency Department and triage nurse.   

## 2018-02-04 ENCOUNTER — Telehealth: Payer: Self-pay | Admitting: Oncology

## 2018-02-04 NOTE — Telephone Encounter (Signed)
Scheduled appt per 12/18 los - pt to get an updated schedule next visit.

## 2018-02-15 ENCOUNTER — Ambulatory Visit (HOSPITAL_COMMUNITY)
Admission: RE | Admit: 2018-02-15 | Discharge: 2018-02-15 | Disposition: A | Discharge status: Home / Self Care | Source: Ambulatory Visit | Attending: Oncology | Admitting: Oncology

## 2018-02-15 DIAGNOSIS — C3411 Malignant neoplasm of upper lobe, right bronchus or lung: Secondary | ICD-10-CM | POA: Insufficient documentation

## 2018-02-15 MED ORDER — SODIUM CHLORIDE (PF) 0.9 % IJ SOLN
INTRAMUSCULAR | Status: AC
  Filled 2018-02-15: qty 50

## 2018-02-15 MED ORDER — IOHEXOL 300 MG/ML  SOLN
75.0000 mL | Freq: Once | INTRAMUSCULAR | Status: AC | PRN
  Administered 2018-02-15: 75 mL via INTRAVENOUS

## 2018-02-18 ENCOUNTER — Encounter: Payer: Self-pay | Admitting: Internal Medicine

## 2018-02-18 ENCOUNTER — Inpatient Hospital Stay: Attending: Internal Medicine

## 2018-02-18 ENCOUNTER — Inpatient Hospital Stay

## 2018-02-18 ENCOUNTER — Inpatient Hospital Stay (HOSPITAL_BASED_OUTPATIENT_CLINIC_OR_DEPARTMENT_OTHER): Admitting: Internal Medicine

## 2018-02-18 VITALS — BP 133/67 | HR 72 | Temp 98.5°F | Resp 20 | Ht 63.0 in | Wt 188.9 lb

## 2018-02-18 DIAGNOSIS — Z79899 Other long term (current) drug therapy: Secondary | ICD-10-CM | POA: Insufficient documentation

## 2018-02-18 DIAGNOSIS — I10 Essential (primary) hypertension: Secondary | ICD-10-CM

## 2018-02-18 DIAGNOSIS — E039 Hypothyroidism, unspecified: Secondary | ICD-10-CM | POA: Insufficient documentation

## 2018-02-18 DIAGNOSIS — C3411 Malignant neoplasm of upper lobe, right bronchus or lung: Secondary | ICD-10-CM

## 2018-02-18 DIAGNOSIS — I2510 Atherosclerotic heart disease of native coronary artery without angina pectoris: Secondary | ICD-10-CM | POA: Diagnosis not present

## 2018-02-18 DIAGNOSIS — Z9221 Personal history of antineoplastic chemotherapy: Secondary | ICD-10-CM | POA: Insufficient documentation

## 2018-02-18 DIAGNOSIS — I2584 Coronary atherosclerosis due to calcified coronary lesion: Secondary | ICD-10-CM

## 2018-02-18 DIAGNOSIS — R590 Localized enlarged lymph nodes: Secondary | ICD-10-CM | POA: Diagnosis not present

## 2018-02-18 DIAGNOSIS — Z5112 Encounter for antineoplastic immunotherapy: Secondary | ICD-10-CM | POA: Diagnosis present

## 2018-02-18 DIAGNOSIS — Z87311 Personal history of (healed) other pathological fracture: Secondary | ICD-10-CM

## 2018-02-18 DIAGNOSIS — Z923 Personal history of irradiation: Secondary | ICD-10-CM | POA: Diagnosis not present

## 2018-02-18 LAB — CMP (CANCER CENTER ONLY)
ALT: 10 U/L (ref 0–44)
AST: 13 U/L — ABNORMAL LOW (ref 15–41)
Albumin: 3.8 g/dL (ref 3.5–5.0)
Alkaline Phosphatase: 117 U/L (ref 38–126)
Anion gap: 11 (ref 5–15)
BUN: 25 mg/dL — ABNORMAL HIGH (ref 8–23)
CO2: 22 mmol/L (ref 22–32)
Calcium: 9.6 mg/dL (ref 8.9–10.3)
Chloride: 108 mmol/L (ref 98–111)
Creatinine: 1.08 mg/dL — ABNORMAL HIGH (ref 0.44–1.00)
GFR, Est AFR Am: >60 mL/min (ref >60)
GFR, Estimated: 54 mL/min — ABNORMAL LOW (ref >60)
Glucose, Bld: 98 mg/dL (ref 70–99)
POTASSIUM: 4.2 mmol/L (ref 3.5–5.1)
Sodium: 141 mmol/L (ref 135–145)
Total Bilirubin: 0.4 mg/dL (ref 0.3–1.2)
Total Protein: 7.8 g/dL (ref 6.5–8.1)

## 2018-02-18 LAB — CBC WITH DIFFERENTIAL (CANCER CENTER ONLY)
Abs Immature Granulocytes: 0.08 10*3/uL — ABNORMAL HIGH (ref 0.00–0.07)
BASOS ABS: 0.1 10*3/uL (ref 0.0–0.1)
Basophils Relative: 1 %
Eosinophils Absolute: 0.3 10*3/uL (ref 0.0–0.5)
Eosinophils Relative: 4 %
HCT: 37.1 % (ref 36.0–46.0)
Hemoglobin: 11.6 g/dL — ABNORMAL LOW (ref 12.0–15.0)
Immature Granulocytes: 1 %
Lymphocytes Relative: 17 %
Lymphs Abs: 1.1 10*3/uL (ref 0.7–4.0)
MCH: 28.1 pg (ref 26.0–34.0)
MCHC: 31.3 g/dL (ref 30.0–36.0)
MCV: 89.8 fL (ref 80.0–100.0)
Monocytes Absolute: 0.6 10*3/uL (ref 0.1–1.0)
Monocytes Relative: 9 %
NEUTROS ABS: 4 10*3/uL (ref 1.7–7.7)
Neutrophils Relative %: 68 %
PLATELETS: 281 10*3/uL (ref 150–400)
RBC: 4.13 MIL/uL (ref 3.87–5.11)
RDW: 16.3 % — ABNORMAL HIGH (ref 11.5–15.5)
WBC Count: 6.1 10*3/uL (ref 4.0–10.5)
nRBC: 0 % (ref 0.0–0.2)

## 2018-02-18 LAB — TSH: TSH: 21.332 u[IU]/mL — AB (ref 0.308–3.960)

## 2018-02-18 MED ORDER — SODIUM CHLORIDE 0.9 % IV SOLN
Freq: Once | INTRAVENOUS | Status: AC
  Administered 2018-02-18: 12:00:00 via INTRAVENOUS
  Filled 2018-02-18: qty 250

## 2018-02-18 MED ORDER — SODIUM CHLORIDE 0.9 % IV SOLN
10.0000 mg/kg | Freq: Once | INTRAVENOUS | Status: AC
  Administered 2018-02-18: 860 mg via INTRAVENOUS
  Filled 2018-02-18: qty 10

## 2018-02-18 NOTE — Patient Instructions (Signed)
[facility] Discharge Instructions for Patients Receiving Chemotherapy  Today you received the following chemotherapy agent: Durvalumab (Imfinzi)  To help prevent nausea and vomiting after your treatment, we encourage you to take your nausea medication as directed.    If you develop nausea and vomiting that is not controlled by your nausea medication, call the clinic.   BELOW ARE SYMPTOMS THAT SHOULD BE REPORTED IMMEDIATELY:  *FEVER GREATER THAN 100.5 F  *CHILLS WITH OR WITHOUT FEVER  NAUSEA AND VOMITING THAT IS NOT CONTROLLED WITH YOUR NAUSEA MEDICATION  *UNUSUAL SHORTNESS OF BREATH  *UNUSUAL BRUISING OR BLEEDING  TENDERNESS IN MOUTH AND THROAT WITH OR WITHOUT PRESENCE OF ULCERS  *URINARY PROBLEMS  *BOWEL PROBLEMS  UNUSUAL RASH Items with * indicate a potential emergency and should be followed up as soon as possible.  Feel free to call the clinic should you have any questions or concerns. The clinic phone number is [phone].  Please show the [facility] at check-in to the Emergency Department and triage nurse.

## 2018-02-18 NOTE — Progress Notes (Signed)
[facility] [phone]   [phone]  OFFICE PROGRESS NOTE  [name], MD [address] [place] [address]  DIAGNOSIS: stageIIIB (T3, N2, M0)non-small cell lung cancer, adenocarcinoma diagnosed in March 2019 and presented with large right upper lobe lung mass in addition to right hilar and mediastinal lymphadenopathy.  PD-L1 0%  PRIOR THERAPY: Concurrent chemoradiation with weekly carboplatin for an AUC of 2 and paclitaxel 45 mg/m2.  First dose expected on 06/01/2017.  Status post 7 cycles.  Last dose was giving 07/14/2017.  CURRENT THERAPY:  Consolidation treatment with immunotherapy with Imfinzi (Durvalumab) 10 mg/KG every 2 weeks status post 12 cycles..  INTERVAL HISTORY: Ashley Wilson 63 y.o. female returns to the clinic today for follow-up visit.  The patient is feeling fine today with no concerning complaints.  She has been tolerating her consolidation treatment with Imfinzi fairly well.  She denied having any chest pain, shortness of breath, cough or hemoptysis.  She denied having any fever or chills.  She has no nausea, vomiting, diarrhea or constipation.  She has no headache or visual changes.  The patient had repeat CT scan of the chest performed recently and she is here for evaluation and discussion of her scan results.  MEDICAL HISTORY: Past Medical History:  Diagnosis Date  . Compression fracture of lumbar vertebra ([place])   . Hypertension   . NSCL ca dx'd 05/2017    ALLERGIES:  has No Known Allergies.  MEDICATIONS:  Current Outpatient Medications  Medication Sig Dispense Refill  . acetaminophen (TYLENOL) 500 MG tablet Take 1,000 mg by mouth every 6 (six) hours as needed for mild pain or moderate pain.    Marland Kitchen amLODipine (NORVASC) 10 MG tablet Take 10 mg by mouth daily.    . clotrimazole-betamethasone (LOTRISONE) cream Apply 1 application topically 2 (two) times daily. 45 g 1  . levothyroxine (SYNTHROID, LEVOTHROID) 50  MCG tablet TAKE 1 TABLET(50 MCG) BY MOUTH DAILY BEFORE BREAKFAST 30 tablet 1  . lisinopril-hydrochlorothiazide (PRINZIDE,ZESTORETIC) 10-12.5 MG tablet Take 1 tablet by mouth daily.    Marland Kitchen loperamide (IMODIUM) 2 MG capsule Take 4 mg by mouth as needed for diarrhea or loose stools.    Marland Kitchen spironolactone (ALDACTONE) 25 MG tablet Take 25 mg by mouth daily.    . prochlorperazine (COMPAZINE) 5 MG tablet Take 1 tablet by mouth every 6 (six) hours as needed.  0   No current facility-administered medications for this visit.     SURGICAL HISTORY:  Past Surgical History:  Procedure Laterality Date  . BACK SURGERY  2009   ruptured disc    REVIEW OF SYSTEMS:  Constitutional: negative Eyes: negative Ears, nose, mouth, throat, and face: negative Respiratory: negative Cardiovascular: negative Gastrointestinal: negative Genitourinary:negative Integument/breast: negative Hematologic/lymphatic: negative Musculoskeletal:negative Neurological: negative Behavioral/Psych: negative Endocrine: negative Allergic/Immunologic: negative   PHYSICAL EXAMINATION: General appearance: alert, cooperative and no distress Head: Normocephalic, without obvious abnormality, atraumatic Neck: no adenopathy, no JVD, supple, symmetrical, trachea midline and thyroid not enlarged, symmetric, no tenderness/mass/nodules Lymph nodes: Cervical, supraclavicular, and axillary nodes normal. Resp: clear to auscultation bilaterally Back: symmetric, no curvature. ROM normal. No CVA tenderness. Cardio: regular rate and rhythm, S1, S2 normal, no murmur, click, rub or gallop GI: soft, non-tender; bowel sounds normal; no masses,  no organomegaly Extremities: extremities normal, atraumatic, no cyanosis or edema Neurologic: Alert and oriented X 3, normal strength and tone. Normal symmetric reflexes. Normal coordination and gait  ECOG PERFORMANCE STATUS: 1 -  Symptomatic but completely ambulatory  Blood pressure 133/67, pulse 72,  temperature 98.5 F (36.9 C), temperature source Oral, resp. rate 20, height 5' 3"  (1.6 m), weight 188 lb 14.4 oz (85.7 kg), SpO2 100 %.  LABORATORY DATA: Lab Results  Component Value Date   WBC 6.1 02/18/2018   HGB 11.6 (L) 02/18/2018   HCT 37.1 02/18/2018   MCV 89.8 02/18/2018   PLT 281 02/18/2018      Chemistry      Component Value Date/Time   NA 141 02/18/2018 1015   K 4.2 02/18/2018 1015   CL 108 02/18/2018 1015   CO2 22 02/18/2018 1015   BUN 25 (H) 02/18/2018 1015   CREATININE 1.08 (H) 02/18/2018 1015      Component Value Date/Time   CALCIUM 9.6 02/18/2018 1015   ALKPHOS 117 02/18/2018 1015   AST 13 (L) 02/18/2018 1015   ALT 10 02/18/2018 1015   BILITOT 0.4 02/18/2018 1015       RADIOGRAPHIC STUDIES: Ct Chest W Contrast  Result Date: 02/15/2018 CLINICAL DATA:  63 year old female with history of non-small cell lung cancer diagnosed in April 2019 undergoing ongoing chemotherapy. Radiation therapy completed. EXAM: CT CHEST WITH CONTRAST TECHNIQUE: Multidetector CT imaging of the chest was performed during intravenous contrast administration. CONTRAST:  76m OMNIPAQUE IOHEXOL 300 MG/ML  SOLN COMPARISON:  Chest CT 11/23/2017. FINDINGS: Cardiovascular: Heart size is normal. There is no significant pericardial fluid, thickening or pericardial calcification. There is aortic atherosclerosis, as well as atherosclerosis of the great vessels of the mediastinum and the coronary arteries, including calcified atherosclerotic plaque in the left main, left anterior descending and right coronary arteries. Thickening and calcification of the aortic valve. Mild calcifications of the mitral annulus. Mediastinum/Nodes: No pathologically enlarged mediastinal or hilar lymph nodes. Small hiatal hernia. No axillary lymphadenopathy. Lungs/Pleura: The treated right upper lobe mass appears similar to the prior examination, currently measuring 3.8 x 4.9 cm (axial image 28 of series 2), most compatible  with an area of chronic postradiation mass-like fibrosis. Some patchy areas of ground-glass attenuation and septal thickening are noted in the surrounding right lung parenchyma, also likely to reflect evolving postradiation changes. No new suspicious appearing pulmonary nodules or masses are noted. No acute consolidative airspace disease. No pleural effusions. Upper Abdomen: Incompletely imaged lesion in the left upper quadrant of the abdomen measuring at least 6.2 cm in diameter, similar to prior studies. Aortic atherosclerosis. Musculoskeletal: There are no aggressive appearing lytic or blastic lesions noted in the visualized portions of the skeleton. IMPRESSION: 1. Stable appearance of the treated mass in the right upper lobe, most compatible with an evolving area of postradiation mass-like fibrosis. Continued attention on future follow-up studies is recommended. 2. Aortic atherosclerosis, in addition to left main and 2 vessel coronary artery disease. Please note that although the presence of coronary artery calcium documents the presence of coronary artery disease, the severity of this disease and any potential stenosis cannot be assessed on this non-gated CT examination. Assessment for potential risk factor modification, dietary therapy or pharmacologic therapy may be warranted, if clinically indicated. 3. There are calcifications of the aortic valve and mitral annulus. Echocardiographic correlation for evaluation of potential valvular dysfunction may be warranted if clinically indicated. 4. Small hiatal hernia. Aortic Atherosclerosis (ICD10-I70.0). Electronically Signed   By: DVinnie LangtonM.D.   On: 02/15/2018 10:20    ASSESSMENT AND PLAN: This is a very pleasant 63years old [name] with stage IIIB non-small cell lung cancer, adenocarcinoma.  The patient underwent a course of concurrent chemoradiation with weekly carboplatin and paclitaxel status post 7 cycles.  She tolerated this  course of treatment fairly well with no concerning complaints.   The patient is currently undergoing treatment with consolidation immunotherapy with Imfinzi (Durvalumab) 10 mg/KG every 2 weeks status post 12 cycles. The patient continues to tolerate her treatment well with no concerning adverse effects. She had repeat CT scan of the chest performed recently.  I personally and independently reviewed the scan images and discussed the result and showed the images to the patient today. Her scan showed a stable disease with no concerning findings for progression. I recommended for the patient to continue her current treatment with Imfinzi with the same dose every 2 weeks. For the coronary atherosclerosis, I recommended for the patient to discuss the results with her primary care physician for further evaluation and diet modification as well as referral to cardiology if needed. For the hypothyroidism, I gave the patient refill of her levothyroxine. I will see her back for follow-up visit in 2 weeks for evaluation before the next cycle of her treatment. The patient was advised to call immediately if she has any concerning symptoms in the interval. The patient voices understanding of current disease status and treatment options and is in agreement with the current care plan. All questions were answered. The patient knows to call the clinic with any problems, questions or concerns. We can certainly see the patient much sooner if necessary.  Disclaimer: This note was dictated with voice recognition software. Similar sounding words can inadvertently be transcribed and may not be corrected upon review.

## 2018-03-03 ENCOUNTER — Inpatient Hospital Stay

## 2018-03-03 ENCOUNTER — Inpatient Hospital Stay (HOSPITAL_BASED_OUTPATIENT_CLINIC_OR_DEPARTMENT_OTHER): Admitting: Internal Medicine

## 2018-03-03 ENCOUNTER — Encounter: Payer: Self-pay | Admitting: Internal Medicine

## 2018-03-03 VITALS — BP 98/73 | HR 76 | Temp 98.6°F | Resp 18 | Ht 63.0 in | Wt 187.3 lb

## 2018-03-03 DIAGNOSIS — E032 Hypothyroidism due to medicaments and other exogenous substances: Secondary | ICD-10-CM

## 2018-03-03 DIAGNOSIS — E039 Hypothyroidism, unspecified: Secondary | ICD-10-CM

## 2018-03-03 DIAGNOSIS — Z923 Personal history of irradiation: Secondary | ICD-10-CM

## 2018-03-03 DIAGNOSIS — R590 Localized enlarged lymph nodes: Secondary | ICD-10-CM | POA: Diagnosis not present

## 2018-03-03 DIAGNOSIS — C3411 Malignant neoplasm of upper lobe, right bronchus or lung: Secondary | ICD-10-CM | POA: Diagnosis not present

## 2018-03-03 DIAGNOSIS — Z5112 Encounter for antineoplastic immunotherapy: Secondary | ICD-10-CM

## 2018-03-03 DIAGNOSIS — I10 Essential (primary) hypertension: Secondary | ICD-10-CM

## 2018-03-03 DIAGNOSIS — I2510 Atherosclerotic heart disease of native coronary artery without angina pectoris: Secondary | ICD-10-CM

## 2018-03-03 DIAGNOSIS — Z87311 Personal history of (healed) other pathological fracture: Secondary | ICD-10-CM

## 2018-03-03 DIAGNOSIS — Z9221 Personal history of antineoplastic chemotherapy: Secondary | ICD-10-CM | POA: Diagnosis not present

## 2018-03-03 DIAGNOSIS — Z79899 Other long term (current) drug therapy: Secondary | ICD-10-CM

## 2018-03-03 LAB — CMP (CANCER CENTER ONLY)
ALT: 11 U/L (ref 0–44)
AST: 11 U/L — ABNORMAL LOW (ref 15–41)
Albumin: 3.7 g/dL (ref 3.5–5.0)
Alkaline Phosphatase: 121 U/L (ref 38–126)
Anion gap: 10 (ref 5–15)
BUN: 18 mg/dL (ref 8–23)
CO2: 23 mmol/L (ref 22–32)
Calcium: 9.4 mg/dL (ref 8.9–10.3)
Chloride: 108 mmol/L (ref 98–111)
Creatinine: 1.15 mg/dL — ABNORMAL HIGH (ref 0.44–1.00)
GFR, EST AFRICAN AMERICAN: 58 mL/min — AB (ref >60)
GFR, Estimated: 50 mL/min — ABNORMAL LOW (ref >60)
Glucose, Bld: 112 mg/dL — ABNORMAL HIGH (ref 70–99)
Potassium: 4.2 mmol/L (ref 3.5–5.1)
Sodium: 141 mmol/L (ref 135–145)
Total Bilirubin: 0.4 mg/dL (ref 0.3–1.2)
Total Protein: 7.7 g/dL (ref 6.5–8.1)

## 2018-03-03 LAB — CBC WITH DIFFERENTIAL (CANCER CENTER ONLY)
Abs Immature Granulocytes: 0.04 10*3/uL (ref 0.00–0.07)
Basophils Absolute: 0.1 10*3/uL (ref 0.0–0.1)
Basophils Relative: 1 %
Eosinophils Absolute: 0.2 10*3/uL (ref 0.0–0.5)
Eosinophils Relative: 3 %
HCT: 37.4 % (ref 36.0–46.0)
Hemoglobin: 11.6 g/dL — ABNORMAL LOW (ref 12.0–15.0)
Immature Granulocytes: 1 %
Lymphocytes Relative: 19 %
Lymphs Abs: 1.2 10*3/uL (ref 0.7–4.0)
MCH: 28.1 pg (ref 26.0–34.0)
MCHC: 31 g/dL (ref 30.0–36.0)
MCV: 90.6 fL (ref 80.0–100.0)
Monocytes Absolute: 0.6 10*3/uL (ref 0.1–1.0)
Monocytes Relative: 9 %
NRBC: 0 % (ref 0.0–0.2)
Neutro Abs: 4 10*3/uL (ref 1.7–7.7)
Neutrophils Relative %: 67 %
Platelet Count: 248 10*3/uL (ref 150–400)
RBC: 4.13 MIL/uL (ref 3.87–5.11)
RDW: 15.1 % (ref 11.5–15.5)
WBC Count: 6 10*3/uL (ref 4.0–10.5)

## 2018-03-03 MED ORDER — SODIUM CHLORIDE 0.9 % IV SOLN
10.0000 mg/kg | Freq: Once | INTRAVENOUS | Status: AC
  Administered 2018-03-03: 860 mg via INTRAVENOUS
  Filled 2018-03-03: qty 10

## 2018-03-03 MED ORDER — SODIUM CHLORIDE 0.9 % IV SOLN
Freq: Once | INTRAVENOUS | Status: AC
  Administered 2018-03-03: 10:00:00 via INTRAVENOUS
  Filled 2018-03-03: qty 250

## 2018-03-03 NOTE — Progress Notes (Signed)
[facility] Telephone:(336) [phone]   Fax:(336) [phone]  OFFICE PROGRESS NOTE  [name], MD [address] [place] [address]  DIAGNOSIS: stageIIIB (T3, N2, M0)non-small cell lung cancer, adenocarcinoma diagnosed in March 2019 and presented with large right upper lobe lung mass in addition to right hilar and mediastinal lymphadenopathy.  PD-L1 0%  PRIOR THERAPY: Concurrent chemoradiation with weekly carboplatin for an AUC of 2 and paclitaxel 45 mg/m2.  First dose expected on 06/01/2017.  Status post 7 cycles.  Last dose was giving 07/14/2017.  CURRENT THERAPY:  Consolidation treatment with immunotherapy with Imfinzi (Durvalumab) 10 mg/KG every 2 weeks status post 13 cycles..  INTERVAL HISTORY: Ashley Wilson 63 y.o. female returns to the clinic today for follow-up visit.  The patient is feeling fine today with no concerning complaints.  She continues to tolerate her treatment with Imfinzi fairly well.  She denied having any chest pain, shortness of breath, cough or hemoptysis.  She denied having any fever or chills.  She has no nausea, vomiting, diarrhea or constipation.  She has no headache or visual changes.  The patient is here today for evaluation before starting cycle #14 of her treatment.  MEDICAL HISTORY: Past Medical History:  Diagnosis Date  . Compression fracture of lumbar vertebra ([employer])   . Hypertension   . NSCL ca dx'd 05/2017    ALLERGIES:  has No Known Allergies.  MEDICATIONS:  Current Outpatient Medications  Medication Sig Dispense Refill  . acetaminophen (TYLENOL) 500 MG tablet Take 1,000 mg by mouth every 6 (six) hours as needed for mild pain or moderate pain.    Marland Kitchen amLODipine (NORVASC) 10 MG tablet Take 10 mg by mouth daily.    . clotrimazole-betamethasone (LOTRISONE) cream Apply 1 application topically 2 (two) times daily. 45 g 1  . levothyroxine (SYNTHROID, LEVOTHROID) 50 MCG tablet TAKE 1 TABLET(50 MCG) BY MOUTH DAILY BEFORE  BREAKFAST 30 tablet 1  . lisinopril-hydrochlorothiazide (PRINZIDE,ZESTORETIC) 10-12.5 MG tablet Take 1 tablet by mouth daily.    Marland Kitchen loperamide (IMODIUM) 2 MG capsule Take 4 mg by mouth as needed for diarrhea or loose stools.    . prochlorperazine (COMPAZINE) 5 MG tablet Take 1 tablet by mouth every 6 (six) hours as needed.  0  . spironolactone (ALDACTONE) 25 MG tablet Take 25 mg by mouth daily.     No current facility-administered medications for this visit.     SURGICAL HISTORY:  Past Surgical History:  Procedure Laterality Date  . BACK SURGERY  2009   ruptured disc    REVIEW OF SYSTEMS:  A comprehensive review of systems was negative.   PHYSICAL EXAMINATION: General appearance: alert, cooperative and no distress Head: Normocephalic, without obvious abnormality, atraumatic Neck: no adenopathy, no JVD, supple, symmetrical, trachea midline and thyroid not enlarged, symmetric, no tenderness/mass/nodules Lymph nodes: Cervical, supraclavicular, and axillary nodes normal. Resp: clear to auscultation bilaterally Back: symmetric, no curvature. ROM normal. No CVA tenderness. Cardio: regular rate and rhythm, S1, S2 normal, no murmur, click, rub or gallop GI: soft, non-tender; bowel sounds normal; no masses,  no organomegaly Extremities: extremities normal, atraumatic, no cyanosis or edema  ECOG PERFORMANCE STATUS: 0 - Asymptomatic  Blood pressure 98/73, pulse 76, temperature 98.6 F (37 C), temperature source Oral, resp. rate 18, height 5' 3"  (1.6 m), weight 187 lb 4.8 oz (85 kg), SpO2 100 %.  LABORATORY DATA: Lab Results  Component Value Date   WBC 6.0 03/03/2018   HGB 11.6 (  L) 03/03/2018   HCT 37.4 03/03/2018   MCV 90.6 03/03/2018   PLT 248 03/03/2018      Chemistry      Component Value Date/Time   NA 141 03/03/2018 0835   K 4.2 03/03/2018 0835   CL 108 03/03/2018 0835   CO2 23 03/03/2018 0835   BUN 18 03/03/2018 0835   CREATININE 1.15 (H) 03/03/2018 0835      Component  Value Date/Time   CALCIUM 9.4 03/03/2018 0835   ALKPHOS 121 03/03/2018 0835   AST 11 (L) 03/03/2018 0835   ALT 11 03/03/2018 0835   BILITOT 0.4 03/03/2018 0835       RADIOGRAPHIC STUDIES: Ct Chest W Contrast  Result Date: 02/15/2018 CLINICAL DATA:  63 year old female with history of non-small cell lung cancer diagnosed in April 2019 undergoing ongoing chemotherapy. Radiation therapy completed. EXAM: CT CHEST WITH CONTRAST TECHNIQUE: Multidetector CT imaging of the chest was performed during intravenous contrast administration. CONTRAST:  62m OMNIPAQUE IOHEXOL 300 MG/ML  SOLN COMPARISON:  Chest CT 11/23/2017. FINDINGS: Cardiovascular: Heart size is normal. There is no significant pericardial fluid, thickening or pericardial calcification. There is aortic atherosclerosis, as well as atherosclerosis of the great vessels of the mediastinum and the coronary arteries, including calcified atherosclerotic plaque in the left main, left anterior descending and right coronary arteries. Thickening and calcification of the aortic valve. Mild calcifications of the mitral annulus. Mediastinum/Nodes: No pathologically enlarged mediastinal or hilar lymph nodes. Small hiatal hernia. No axillary lymphadenopathy. Lungs/Pleura: The treated right upper lobe mass appears similar to the prior examination, currently measuring 3.8 x 4.9 cm (axial image 28 of series 2), most compatible with an area of chronic postradiation mass-like fibrosis. Some patchy areas of ground-glass attenuation and septal thickening are noted in the surrounding right lung parenchyma, also likely to reflect evolving postradiation changes. No new suspicious appearing pulmonary nodules or masses are noted. No acute consolidative airspace disease. No pleural effusions. Upper Abdomen: Incompletely imaged lesion in the left upper quadrant of the abdomen measuring at least 6.2 cm in diameter, similar to prior studies. Aortic atherosclerosis. Musculoskeletal:  There are no aggressive appearing lytic or blastic lesions noted in the visualized portions of the skeleton. IMPRESSION: 1. Stable appearance of the treated mass in the right upper lobe, most compatible with an evolving area of postradiation mass-like fibrosis. Continued attention on future follow-up studies is recommended. 2. Aortic atherosclerosis, in addition to left main and 2 vessel coronary artery disease. Please note that although the presence of coronary artery calcium documents the presence of coronary artery disease, the severity of this disease and any potential stenosis cannot be assessed on this non-gated CT examination. Assessment for potential risk factor modification, dietary therapy or pharmacologic therapy may be warranted, if clinically indicated. 3. There are calcifications of the aortic valve and mitral annulus. Echocardiographic correlation for evaluation of potential valvular dysfunction may be warranted if clinically indicated. 4. Small hiatal hernia. Aortic Atherosclerosis (ICD10-I70.0). Electronically Signed   By: DVinnie LangtonM.D.   On: 02/15/2018 10:20    ASSESSMENT AND PLAN: This is a very pleasant 63years old [name] with stage IIIB non-small cell lung cancer, adenocarcinoma.   The patient underwent a course of concurrent chemoradiation with weekly carboplatin and paclitaxel status post 7 cycles.  She tolerated this course of treatment fairly well with no concerning complaints.   The patient is currently undergoing treatment with consolidation immunotherapy with Imfinzi (Durvalumab) 10 mg/KG every 2 weeks status post 13 cycles. The  patient has been tolerating this treatment well with no concerning complaints. I recommended for her to proceed with cycle #14 today as scheduled. I will see her back for follow-up visit in 2 weeks for evaluation before starting cycle #15. She was advised to call immediately if she has any concerning symptoms in the  interval. The patient voices understanding of current disease status and treatment options and is in agreement with the current care plan. All questions were answered. The patient knows to call the clinic with any problems, questions or concerns. We can certainly see the patient much sooner if necessary.  Disclaimer: This note was dictated with voice recognition software. Similar sounding words can inadvertently be transcribed and may not be corrected upon review.

## 2018-03-03 NOTE — Patient Instructions (Signed)
[facility] Discharge Instructions for Patients Receiving Chemotherapy  Today you received the following chemotherapy agents: Imfinzi.  To help prevent nausea and vomiting after your treatment, we encourage you to take your nausea medication as directed.   If you develop nausea and vomiting that is not controlled by your nausea medication, call the clinic.   BELOW ARE SYMPTOMS THAT SHOULD BE REPORTED IMMEDIATELY:  *FEVER GREATER THAN 100.5 F  *CHILLS WITH OR WITHOUT FEVER  NAUSEA AND VOMITING THAT IS NOT CONTROLLED WITH YOUR NAUSEA MEDICATION  *UNUSUAL SHORTNESS OF BREATH  *UNUSUAL BRUISING OR BLEEDING  TENDERNESS IN MOUTH AND THROAT WITH OR WITHOUT PRESENCE OF ULCERS  *URINARY PROBLEMS  *BOWEL PROBLEMS  UNUSUAL RASH Items with * indicate a potential emergency and should be followed up as soon as possible.  Feel free to call the clinic should you have any questions or concerns. The clinic phone number is [phone].  Please show the CHEMO ALERT CARD at check-in to the Emergency Department and triage nurse.   

## 2018-03-04 ENCOUNTER — Telehealth: Payer: Self-pay | Admitting: Internal Medicine

## 2018-03-04 NOTE — Telephone Encounter (Signed)
Added additional cycles per 1/15 los - pt to get an updated schedule next visit.

## 2018-03-05 ENCOUNTER — Other Ambulatory Visit: Payer: Self-pay

## 2018-03-05 ENCOUNTER — Encounter (HOSPITAL_COMMUNITY): Payer: Self-pay

## 2018-03-05 ENCOUNTER — Emergency Department (HOSPITAL_COMMUNITY)
Admission: EM | Admit: 2018-03-05 | Discharge: 2018-03-05 | Disposition: A | Discharge status: Home / Self Care | Attending: Emergency Medicine | Admitting: Emergency Medicine

## 2018-03-05 DIAGNOSIS — Z9221 Personal history of antineoplastic chemotherapy: Secondary | ICD-10-CM | POA: Diagnosis not present

## 2018-03-05 DIAGNOSIS — Z87891 Personal history of nicotine dependence: Secondary | ICD-10-CM | POA: Diagnosis not present

## 2018-03-05 DIAGNOSIS — Z85118 Personal history of other malignant neoplasm of bronchus and lung: Secondary | ICD-10-CM | POA: Diagnosis not present

## 2018-03-05 DIAGNOSIS — I10 Essential (primary) hypertension: Secondary | ICD-10-CM | POA: Diagnosis not present

## 2018-03-05 DIAGNOSIS — R220 Localized swelling, mass and lump, head: Secondary | ICD-10-CM | POA: Diagnosis not present

## 2018-03-05 DIAGNOSIS — E039 Hypothyroidism, unspecified: Secondary | ICD-10-CM | POA: Diagnosis not present

## 2018-03-05 DIAGNOSIS — T783XXA Angioneurotic edema, initial encounter: Secondary | ICD-10-CM | POA: Diagnosis not present

## 2018-03-05 DIAGNOSIS — Z79899 Other long term (current) drug therapy: Secondary | ICD-10-CM | POA: Diagnosis not present

## 2018-03-05 MED ORDER — PREDNISONE 20 MG PO TABS: ORAL_TABLET | ORAL | 0 refills | Status: DC

## 2018-03-05 MED ORDER — DIPHENHYDRAMINE HCL 50 MG/ML IJ SOLN
25.0000 mg | Freq: Once | INTRAMUSCULAR | Status: AC
  Administered 2018-03-05: 25 mg via INTRAVENOUS
  Filled 2018-03-05: qty 1

## 2018-03-05 MED ORDER — FAMOTIDINE IN NACL 20-0.9 MG/50ML-% IV SOLN
20.0000 mg | Freq: Once | INTRAVENOUS | Status: AC
  Administered 2018-03-05: 20 mg via INTRAVENOUS
  Filled 2018-03-05: qty 50

## 2018-03-05 MED ORDER — RANITIDINE HCL 150 MG PO TABS: 150.0000 mg | ORAL_TABLET | Freq: Two times a day (BID) | ORAL | 0 refills | Status: DC

## 2018-03-05 MED ORDER — HYDROCHLOROTHIAZIDE 25 MG PO TABS: 25.0000 mg | ORAL_TABLET | Freq: Every day | ORAL | 0 refills | Status: DC

## 2018-03-05 MED ORDER — METHYLPREDNISOLONE SODIUM SUCC 125 MG IJ SOLR
125.0000 mg | Freq: Once | INTRAMUSCULAR | Status: AC
  Administered 2018-03-05: 125 mg via INTRAVENOUS
  Filled 2018-03-05: qty 2

## 2018-03-05 MED ORDER — DIPHENHYDRAMINE HCL 25 MG PO TABS: 25.0000 mg | ORAL_TABLET | ORAL | 0 refills | Status: DC | PRN

## 2018-03-05 NOTE — ED Provider Notes (Addendum)
[facility] DEPT Provider Note   CSN: [id number] Arrival date & time: 03/05/18  1654     History   Chief Complaint Chief Complaint  Patient presents with  . Oral Swelling    HPI Ashley Wilson is a 63 y.o. female with a PMHx of HTN, hypothyroidism, [place] lung cancer, and other conditions listed below, who presents to the ED with complaints of left lip swelling that began around 11:10 AM.  Patient states that she had taken her thyroid pill and drink 2 cups of coffee when she started noticing that her left lip was swelling.  She tried rinsing her mouth out with peroxide and taking Tylenol which did not help.  No known aggravating factors.  She recently started using [place] soap 5 days ago on her face, but this has not changed.  She denies any changes in medications, detergents, lotions, perfumes, make-up, cosmetics, foods, animal or plant contact, or any other exposures that are new or different recently.  She denies having any dental pain, drooling, trismus, difficulty swallowing or breathing, wheezing, chest pain, shortness of breath, abdominal pain, nausea, vomiting, diarrhea, new or worsening numbness or tingling, focal weakness, or any other complaints at this time.  Of note, she takes lisinopril-HCTZ which she has been on for several years.  She took it today after the swelling had already started.  She received Imfinzi (chemo) on 03/03/18, this was the same chemo she's been on for several cycles, no changes in this recently.   The history is provided by the patient and medical records. No language interpreter was used.    Past Medical History:  Diagnosis Date  . Compression fracture of lumbar vertebra ([place])   . Hypertension   . NSCL ca dx'd 05/2017    Patient Active Problem List   Diagnosis Date Noted  . Coronary atherosclerosis due to calcified coronary lesion 02/18/2018  . Hypothyroidism 02/03/2018  . Encounter for antineoplastic immunotherapy 10/01/2017   . Pain of left hand 07/29/2017  . Malignant neoplasm of bronchus of right upper lobe ([place]) 05/22/2017  . Goals of care, counseling/discussion 05/22/2017  . Encounter for antineoplastic chemotherapy 05/22/2017    Past Surgical History:  Procedure Laterality Date  . BACK SURGERY  2009   ruptured disc     OB History   No obstetric history on file.      Home Medications    Prior to Admission medications   Medication Sig Start Date End Date Taking? Authorizing Provider  acetaminophen (TYLENOL) 500 MG tablet Take 1,000 mg by mouth every 6 (six) hours as needed for mild pain or moderate pain.    [provider]  amLODipine (NORVASC) 10 MG tablet Take 10 mg by mouth daily.    [provider]  clotrimazole-betamethasone (LOTRISONE) cream Apply 1 application topically 2 (two) times daily. 07/14/17   Tanner, Lyndon Code., PA-C  levothyroxine (SYNTHROID, LEVOTHROID) 50 MCG tablet TAKE 1 TABLET(50 MCG) BY MOUTH DAILY BEFORE BREAKFAST 02/03/18   Curt Bears, MD  lisinopril-hydrochlorothiazide (PRINZIDE,ZESTORETIC) 10-12.5 MG tablet Take 1 tablet by mouth daily.    [provider]  loperamide (IMODIUM) 2 MG capsule Take 4 mg by mouth as needed for diarrhea or loose stools.    [provider]  prochlorperazine (COMPAZINE) 5 MG tablet Take 1 tablet by mouth every 6 (six) hours as needed. 06/02/17   [provider]  spironolactone (ALDACTONE) 25 MG tablet Take 25 mg by mouth daily.    [provider]  Family History Family History  Problem Relation Age of Onset  . Hypertension Mother   . CAD Mother   . Heart attack Mother   . Diabetes Father   . CAD Father   . Bone cancer Brother     Social History Social History   Tobacco Use  . Smoking status: Former Smoker    Years: 35.00    Types: Cigarettes    Start date: 1978    Last attempt to quit: 04/28/2017    Years since quitting: 0.8  . Smokeless tobacco: Never Used  Substance Use  Topics  . Alcohol use: Yes    Alcohol/week: 2.0 - 3.0 standard drinks    Types: 2 - 3 Cans of beer per week    Comment: occasionally   . Drug use: Never     Allergies   Patient has no known allergies.   Review of Systems Review of Systems  HENT: Positive for facial swelling. Negative for dental problem and trouble swallowing.   Respiratory: Negative for shortness of breath and wheezing.   Cardiovascular: Negative for chest pain.  Gastrointestinal: Negative for abdominal pain, diarrhea, nausea and vomiting.  Skin: Negative for rash.  Allergic/Immunologic: Positive for immunocompromised state (on chemo).  Neurological: Negative for weakness and numbness.  Psychiatric/Behavioral: Negative for confusion.   All other systems reviewed and are negative for acute change except as noted in the HPI.    Physical Exam Updated Vital Signs BP 122/69 (BP Location: Left Arm)   Pulse 93   Temp 98.9 F (37.2 C) (Oral)   Resp 18   SpO2 100%   Physical Exam Vitals signs and nursing note reviewed.  Constitutional:      General: She is not in acute distress.    Appearance: Normal appearance. She is well-developed. She is not toxic-appearing.     Comments: Afebrile, nontoxic, NAD  HENT:     Head: Normocephalic and atraumatic.     Mouth/Throat:     Mouth: Mucous membranes are moist. Angioedema present.     Dentition: No dental tenderness or dental abscesses.     Tongue: No lesions. Tongue does not protrude in midline.     Pharynx: Oropharynx is clear. Uvula midline. No pharyngeal swelling, oropharyngeal exudate, posterior oropharyngeal erythema or uvula swelling.     Tonsils: No tonsillar exudate or tonsillar abscesses. Swelling: 0 on the right. 0 on the left.     Comments: Mild swelling to L upper and lower lips, no tongue swelling. No overlying skin changes. No dental abscess or tenderness. Oropharynx clear and moist, without uvular swelling or deviation, no trismus or drooling, no  tonsillar swelling or erythema, no exudates.  No PTA.  Eyes:     General:        Right eye: No discharge.        Left eye: No discharge.     Conjunctiva/sclera: Conjunctivae normal.  Neck:     Musculoskeletal: Normal range of motion and neck supple.  Cardiovascular:     Rate and Rhythm: Normal rate and regular rhythm.     Pulses: Normal pulses.     Heart sounds: Normal heart sounds, S1 normal and S2 normal. No murmur. No friction rub. No gallop.   Pulmonary:     Effort: Pulmonary effort is normal. No respiratory distress.     Breath sounds: Normal breath sounds. No decreased breath sounds, wheezing, rhonchi or rales.  Abdominal:     General: Bowel sounds are normal. There is no distension.  Palpations: Abdomen is soft. Abdomen is not rigid.     Tenderness: There is no abdominal tenderness. There is no right CVA tenderness, left CVA tenderness, guarding or rebound. Negative signs include Murphy's sign and McBurney's sign.  Musculoskeletal: Normal range of motion.  Skin:    General: Skin is warm and dry.     Findings: No rash.  Neurological:     Mental Status: She is alert and oriented to person, place, and time.     Sensory: Sensation is intact. No sensory deficit.     Motor: Motor function is intact.  Psychiatric:        Mood and Affect: Mood and affect normal.        Behavior: Behavior normal.      ED Treatments / Results  Labs (all labs ordered are listed, but only abnormal results are displayed) Labs Reviewed - No data to display  EKG None  Radiology No results found.  Procedures Procedures (including critical care time)  Medications Ordered in ED Medications  methylPREDNISolone sodium succinate (SOLU-MEDROL) 125 mg/2 mL injection 125 mg (125 mg Intravenous Given 03/05/18 1812)  diphenhydrAMINE (BENADRYL) injection 25 mg (25 mg Intravenous Given 03/05/18 1811)  famotidine (PEPCID) IVPB 20 mg premix (0 mg Intravenous Stopped 03/05/18 2007)     Initial  Impression / Assessment and Plan / ED Course  I have reviewed the triage vital signs and the nursing notes.  Pertinent labs & imaging results that were available during my care of the patient were reviewed by me and considered in my medical decision making (see chart for details).     63 y.o. female here with L upper and lower lip swelling since 11:10am. No new changes in meds or products, recently started using [place] soap a few days ago but nothing new today. On exam, mild L upper and lower lip swelling, no tenderness, no overlying skin changes, no dental abscess or tenderness, oropharynx clear. Appears to be likely allergic reaction vs angioedema from lisinopril. Will give solumedrol, benadryl, and pepcid, and reassess.   8:33 PM Pt feeling better and without any worsening/progressing symptoms, lip swelling improved. I feel pt is safe to go home at this time. Will send home with benadryl/zantac/prednisone, will switch lisinopril-HCTZ to HCTZ alone (increasing to 25mg  since we're stopping the lisinopril component). Advised avoidance of allergens, stop using [place] soap and any other triggers. F/up with PCP in 3-5 days for recheck. Discussed case with my attending Dr. Roderic Palau who agrees with plan. I explained the diagnosis and have given explicit precautions to return to the ER including for any other new or worsening symptoms. The patient understands and accepts the medical plan as it's been dictated and I have answered their questions. Discharge instructions concerning home care and prescriptions have been given. The patient is STABLE and is discharged to home in good condition.    Final Clinical Impressions(s) / ED Diagnoses   Final diagnoses:  Lip swelling  Angioedema of lips, initial encounter    ED Discharge Orders         Ordered    hydrochlorothiazide (HYDRODIURIL) 25 MG tablet  Daily     03/05/18 2032    predniSONE (DELTASONE) 20 MG tablet     03/05/18 2032    ranitidine (ZANTAC)  150 MG tablet  2 times daily     03/05/18 2032    diphenhydrAMINE (BENADRYL) 25 MG tablet  Every 4 hours PRN     03/05/18 2032  [address], [place], [place] 03/05/18 2035    Milton Ferguson, MD 03/05/18 [phone]

## 2018-03-05 NOTE — Discharge Instructions (Addendum)
Take Benadryl and zantac as directed, taking your next dose of zantac tonight with dinner. Take prednisone as directed until completed, starting tomorrow since you received today's dose here.  Stop taking your Lisinopril-Hydrochlorothiazide pill and start taking Hydrochlorothiazide that is prescribed today. Continue your other usual home medications. Get plenty of rest and drink plenty of fluids. Avoid any known triggers. Please followup with your primary doctor in 3-5 days for recheck of symptoms and for discussion of your diagnoses and further evaluation after today's visit. Return to the ER for changes or worsening symptoms

## 2018-03-05 NOTE — ED Triage Notes (Signed)
Pt reports L sided lip swelling that started at 1110a this morning. She states that she took all her normal medications today and had 2 cups of coffee. Nothing new or out of the ordinary. Last chemo was on Wednesday. She also endorses a slight headache that she thinks may be r/t stress. Airway patent. No SOB or tongue swelling at this time.

## 2018-03-18 ENCOUNTER — Inpatient Hospital Stay

## 2018-03-18 ENCOUNTER — Encounter: Payer: Self-pay | Admitting: Internal Medicine

## 2018-03-18 ENCOUNTER — Inpatient Hospital Stay (HOSPITAL_BASED_OUTPATIENT_CLINIC_OR_DEPARTMENT_OTHER): Admitting: Internal Medicine

## 2018-03-18 VITALS — BP 126/62 | HR 69 | Temp 98.4°F | Resp 18 | Ht 63.0 in | Wt 186.6 lb

## 2018-03-18 DIAGNOSIS — R590 Localized enlarged lymph nodes: Secondary | ICD-10-CM | POA: Diagnosis not present

## 2018-03-18 DIAGNOSIS — Z5112 Encounter for antineoplastic immunotherapy: Secondary | ICD-10-CM

## 2018-03-18 DIAGNOSIS — E039 Hypothyroidism, unspecified: Secondary | ICD-10-CM

## 2018-03-18 DIAGNOSIS — C3411 Malignant neoplasm of upper lobe, right bronchus or lung: Secondary | ICD-10-CM

## 2018-03-18 DIAGNOSIS — Z79899 Other long term (current) drug therapy: Secondary | ICD-10-CM

## 2018-03-18 DIAGNOSIS — Z923 Personal history of irradiation: Secondary | ICD-10-CM | POA: Diagnosis not present

## 2018-03-18 DIAGNOSIS — I2510 Atherosclerotic heart disease of native coronary artery without angina pectoris: Secondary | ICD-10-CM

## 2018-03-18 DIAGNOSIS — Z9221 Personal history of antineoplastic chemotherapy: Secondary | ICD-10-CM

## 2018-03-18 DIAGNOSIS — I10 Essential (primary) hypertension: Secondary | ICD-10-CM

## 2018-03-18 DIAGNOSIS — Z87311 Personal history of (healed) other pathological fracture: Secondary | ICD-10-CM

## 2018-03-18 LAB — CBC WITH DIFFERENTIAL (CANCER CENTER ONLY)
Abs Immature Granulocytes: 0.07 10*3/uL (ref 0.00–0.07)
Basophils Absolute: 0 10*3/uL (ref 0.0–0.1)
Basophils Relative: 0 %
Eosinophils Absolute: 0.1 10*3/uL (ref 0.0–0.5)
Eosinophils Relative: 1 %
HCT: 38.3 % (ref 36.0–46.0)
Hemoglobin: 11.9 g/dL — ABNORMAL LOW (ref 12.0–15.0)
Immature Granulocytes: 1 %
LYMPHS ABS: 1.2 10*3/uL (ref 0.7–4.0)
LYMPHS PCT: 12 %
MCH: 28 pg (ref 26.0–34.0)
MCHC: 31.1 g/dL (ref 30.0–36.0)
MCV: 90.1 fL (ref 80.0–100.0)
Monocytes Absolute: 0.6 10*3/uL (ref 0.1–1.0)
Monocytes Relative: 6 %
NRBC: 0 % (ref 0.0–0.2)
Neutro Abs: 8.3 10*3/uL — ABNORMAL HIGH (ref 1.7–7.7)
Neutrophils Relative %: 80 %
Platelet Count: 268 10*3/uL (ref 150–400)
RBC: 4.25 MIL/uL (ref 3.87–5.11)
RDW: 14.3 % (ref 11.5–15.5)
WBC Count: 10.4 10*3/uL (ref 4.0–10.5)

## 2018-03-18 LAB — CMP (CANCER CENTER ONLY)
ALT: 16 U/L (ref 0–44)
AST: 15 U/L (ref 15–41)
Albumin: 3.6 g/dL (ref 3.5–5.0)
Alkaline Phosphatase: 124 U/L (ref 38–126)
Anion gap: 9 (ref 5–15)
BUN: 20 mg/dL (ref 8–23)
CO2: 25 mmol/L (ref 22–32)
Calcium: 9.9 mg/dL (ref 8.9–10.3)
Chloride: 107 mmol/L (ref 98–111)
Creatinine: 1.05 mg/dL — ABNORMAL HIGH (ref 0.44–1.00)
GFR, Est AFR Am: >60 mL/min (ref >60)
GFR, Estimated: 56 mL/min — ABNORMAL LOW (ref >60)
Glucose, Bld: 101 mg/dL — ABNORMAL HIGH (ref 70–99)
Potassium: 4.7 mmol/L (ref 3.5–5.1)
SODIUM: 141 mmol/L (ref 135–145)
Total Bilirubin: 0.3 mg/dL (ref 0.3–1.2)
Total Protein: 7.8 g/dL (ref 6.5–8.1)

## 2018-03-18 LAB — TSH: TSH: 3.166 u[IU]/mL (ref 0.308–3.960)

## 2018-03-18 MED ORDER — SODIUM CHLORIDE 0.9 % IV SOLN
10.0000 mg/kg | Freq: Once | INTRAVENOUS | Status: AC
  Administered 2018-03-18: 860 mg via INTRAVENOUS
  Filled 2018-03-18: qty 10

## 2018-03-18 MED ORDER — SODIUM CHLORIDE 0.9 % IV SOLN
Freq: Once | INTRAVENOUS | Status: AC
  Administered 2018-03-18: 13:00:00 via INTRAVENOUS
  Filled 2018-03-18: qty 250

## 2018-03-18 NOTE — Patient Instructions (Signed)
[facility] Discharge Instructions for Patients Receiving Chemotherapy  Today you received the following chemotherapy agents: Imfinzi.  To help prevent nausea and vomiting after your treatment, we encourage you to take your nausea medication as directed.   If you develop nausea and vomiting that is not controlled by your nausea medication, call the clinic.   BELOW ARE SYMPTOMS THAT SHOULD BE REPORTED IMMEDIATELY:  *FEVER GREATER THAN 100.5 F  *CHILLS WITH OR WITHOUT FEVER  NAUSEA AND VOMITING THAT IS NOT CONTROLLED WITH YOUR NAUSEA MEDICATION  *UNUSUAL SHORTNESS OF BREATH  *UNUSUAL BRUISING OR BLEEDING  TENDERNESS IN MOUTH AND THROAT WITH OR WITHOUT PRESENCE OF ULCERS  *URINARY PROBLEMS  *BOWEL PROBLEMS  UNUSUAL RASH Items with * indicate a potential emergency and should be followed up as soon as possible.  Feel free to call the clinic should you have any questions or concerns. The clinic phone number is [phone].  Please show the CHEMO ALERT CARD at check-in to the Emergency Department and triage nurse.   

## 2018-03-18 NOTE — Progress Notes (Signed)
[facility] [phone]   [phone]  OFFICE PROGRESS NOTE  [name], MD [address] [place] [address]  DIAGNOSIS: stageIIIB (T3, N2, M0)non-small cell lung cancer, adenocarcinoma diagnosed in March 2019 and presented with large right upper lobe lung mass in addition to right hilar and mediastinal lymphadenopathy.  PD-L1 0%  PRIOR THERAPY: Concurrent chemoradiation with weekly carboplatin for an AUC of 2 and paclitaxel 45 mg/m2.  First dose expected on 06/01/2017.  Status post 7 cycles.  Last dose was giving 07/14/2017.  CURRENT THERAPY:  Consolidation treatment with immunotherapy with Imfinzi (Durvalumab) 10 mg/KG every 2 weeks status post 14 cycles..  INTERVAL HISTORY: Ashley Wilson 63 y.o. female returns to the clinic today for follow-up visit.  The patient is feeling fine with no concerning complaints.  She was seen at the emergency department recently with swollen lips and tongue after treatment with ACE inhibitor.  This was discontinued.  The patient was treated with steroids and antihistamines.  She is feeling much better today.  She denied having any chest pain, shortness of breath, cough or hemoptysis.  She denied having any fever or chills.  She has no nausea, vomiting, diarrhea or constipation.  She continues to tolerate her treatment with Imfinzi fairly well.  The patient is here today for evaluation before starting cycle #15.   MEDICAL HISTORY: Past Medical History:  Diagnosis Date  . Compression fracture of lumbar vertebra ([place])   . Hypertension   . NSCL ca dx'd 05/2017    ALLERGIES:  has No Known Allergies.  MEDICATIONS:  Current Outpatient Medications  Medication Sig Dispense Refill  . acetaminophen (TYLENOL) 500 MG tablet Take 1,000 mg by mouth every 6 (six) hours as needed for mild pain or moderate pain.    Marland Kitchen amLODipine (NORVASC) 10 MG tablet Take 10 mg by mouth daily.    . clotrimazole-betamethasone  (LOTRISONE) cream Apply 1 application topically 2 (two) times daily. (Patient not taking: Reported on 03/05/2018) 45 g 1  . diphenhydrAMINE (BENADRYL) 25 MG tablet Take 1 tablet (25 mg total) by mouth every 4 (four) hours as needed for itching or allergies. 20 tablet 0  . hydrochlorothiazide (HYDRODIURIL) 25 MG tablet Take 1 tablet (25 mg total) by mouth daily. 30 tablet 0  . levothyroxine (SYNTHROID, LEVOTHROID) 50 MCG tablet TAKE 1 TABLET(50 MCG) BY MOUTH DAILY BEFORE BREAKFAST (Patient taking differently: Take 50 mcg by mouth daily before breakfast. ) 30 tablet 1  . lisinopril-hydrochlorothiazide (PRINZIDE,ZESTORETIC) 10-12.5 MG tablet Take 1 tablet by mouth daily.    Marland Kitchen loperamide (IMODIUM) 2 MG capsule Take 4 mg by mouth as needed for diarrhea or loose stools.    . predniSONE (DELTASONE) 20 MG tablet 3 tabs po daily x 4 days STARTING 03/06/18 12 tablet 0  . prochlorperazine (COMPAZINE) 5 MG tablet Take 1 tablet by mouth every 6 (six) hours as needed.  0  . ranitidine (ZANTAC) 150 MG tablet Take 1 tablet (150 mg total) by mouth 2 (two) times daily. STARTING TONIGHT and taking x4 days after 10 tablet 0  . rosuvastatin (CRESTOR) 20 MG tablet Take 20 mg by mouth daily.    Marland Kitchen spironolactone (ALDACTONE) 25 MG tablet Take 25 mg by mouth daily.     No current facility-administered medications for this visit.     SURGICAL HISTORY:  Past Surgical History:  Procedure Laterality Date  . BACK SURGERY  2009   ruptured disc  REVIEW OF SYSTEMS:  A comprehensive review of systems was negative.   PHYSICAL EXAMINATION: General appearance: alert, cooperative and no distress Head: Normocephalic, without obvious abnormality, atraumatic Neck: no adenopathy, no JVD, supple, symmetrical, trachea midline and thyroid not enlarged, symmetric, no tenderness/mass/nodules Lymph nodes: Cervical, supraclavicular, and axillary nodes normal. Resp: clear to auscultation bilaterally Back: symmetric, no curvature. ROM  normal. No CVA tenderness. Cardio: regular rate and rhythm, S1, S2 normal, no murmur, click, rub or gallop GI: soft, non-tender; bowel sounds normal; no masses,  no organomegaly Extremities: extremities normal, atraumatic, no cyanosis or edema  ECOG PERFORMANCE STATUS: 0 - Asymptomatic  Blood pressure 126/62, pulse 69, temperature 98.4 F (36.9 C), temperature source Oral, resp. rate 18, height 5' 3"  (1.6 m), weight 186 lb 9.6 oz (84.6 kg), SpO2 100 %.  LABORATORY DATA: Lab Results  Component Value Date   WBC 10.4 03/18/2018   HGB 11.9 (L) 03/18/2018   HCT 38.3 03/18/2018   MCV 90.1 03/18/2018   PLT 268 03/18/2018      Chemistry      Component Value Date/Time   NA 141 03/03/2018 0835   K 4.2 03/03/2018 0835   CL 108 03/03/2018 0835   CO2 23 03/03/2018 0835   BUN 18 03/03/2018 0835   CREATININE 1.15 (H) 03/03/2018 0835      Component Value Date/Time   CALCIUM 9.4 03/03/2018 0835   ALKPHOS 121 03/03/2018 0835   AST 11 (L) 03/03/2018 0835   ALT 11 03/03/2018 0835   BILITOT 0.4 03/03/2018 0835       RADIOGRAPHIC STUDIES: No results found.  ASSESSMENT AND PLAN: This is a very pleasant 63 years old [name] with stage IIIB non-small cell lung cancer, adenocarcinoma.   The patient underwent a course of concurrent chemoradiation with weekly carboplatin and paclitaxel status post 7 cycles.  She tolerated this course of treatment fairly well with no concerning complaints.   The patient is currently undergoing treatment with consolidation immunotherapy with Imfinzi (Durvalumab) 10 mg/KG every 2 weeks status post 14 cycles. The patient continues to tolerate her treatment well with no concerning adverse effects. I recommended for her to proceed with cycle #15 today as scheduled. I will see the patient back for follow-up visit in 2 weeks for evaluation before the next cycle of her treatment. She was advised to call immediately if she has any concerning symptoms in  the interval. The patient voices understanding of current disease status and treatment options and is in agreement with the current care plan. All questions were answered. The patient knows to call the clinic with any problems, questions or concerns. We can certainly see the patient much sooner if necessary.  Disclaimer: This note was dictated with voice recognition software. Similar sounding words can inadvertently be transcribed and may not be corrected upon review.

## 2018-03-31 ENCOUNTER — Inpatient Hospital Stay (HOSPITAL_BASED_OUTPATIENT_CLINIC_OR_DEPARTMENT_OTHER): Admitting: Internal Medicine

## 2018-03-31 ENCOUNTER — Encounter: Payer: Self-pay | Admitting: Internal Medicine

## 2018-03-31 ENCOUNTER — Inpatient Hospital Stay

## 2018-03-31 ENCOUNTER — Inpatient Hospital Stay: Attending: Internal Medicine

## 2018-03-31 VITALS — BP 114/62 | HR 79 | Temp 98.7°F | Resp 18 | Ht 63.0 in | Wt 190.0 lb

## 2018-03-31 DIAGNOSIS — Z79899 Other long term (current) drug therapy: Secondary | ICD-10-CM | POA: Insufficient documentation

## 2018-03-31 DIAGNOSIS — I10 Essential (primary) hypertension: Secondary | ICD-10-CM | POA: Insufficient documentation

## 2018-03-31 DIAGNOSIS — Z5112 Encounter for antineoplastic immunotherapy: Secondary | ICD-10-CM

## 2018-03-31 DIAGNOSIS — C3411 Malignant neoplasm of upper lobe, right bronchus or lung: Secondary | ICD-10-CM

## 2018-03-31 DIAGNOSIS — C771 Secondary and unspecified malignant neoplasm of intrathoracic lymph nodes: Secondary | ICD-10-CM | POA: Diagnosis not present

## 2018-03-31 LAB — CMP (CANCER CENTER ONLY)
ALT: 16 U/L (ref 0–44)
AST: 19 U/L (ref 15–41)
Albumin: 3.6 g/dL (ref 3.5–5.0)
Alkaline Phosphatase: 120 U/L (ref 38–126)
Anion gap: 10 (ref 5–15)
BUN: 18 mg/dL (ref 8–23)
CO2: 23 mmol/L (ref 22–32)
Calcium: 9.9 mg/dL (ref 8.9–10.3)
Chloride: 107 mmol/L (ref 98–111)
Creatinine: 1.1 mg/dL — ABNORMAL HIGH (ref 0.44–1.00)
GFR, Est AFR Am: >60 mL/min (ref >60)
GFR, Estimated: 53 mL/min — ABNORMAL LOW (ref >60)
Glucose, Bld: 99 mg/dL (ref 70–99)
Potassium: 4.2 mmol/L (ref 3.5–5.1)
Sodium: 140 mmol/L (ref 135–145)
Total Bilirubin: 0.2 mg/dL — ABNORMAL LOW (ref 0.3–1.2)
Total Protein: 7.9 g/dL (ref 6.5–8.1)

## 2018-03-31 LAB — CBC WITH DIFFERENTIAL (CANCER CENTER ONLY)
Abs Immature Granulocytes: 0.05 10*3/uL (ref 0.00–0.07)
Basophils Absolute: 0.1 10*3/uL (ref 0.0–0.1)
Basophils Relative: 1 %
EOS ABS: 0.2 10*3/uL (ref 0.0–0.5)
Eosinophils Relative: 2 %
HEMATOCRIT: 39.2 % (ref 36.0–46.0)
Hemoglobin: 12.4 g/dL (ref 12.0–15.0)
Immature Granulocytes: 1 %
Lymphocytes Relative: 14 %
Lymphs Abs: 1 10*3/uL (ref 0.7–4.0)
MCH: 28.2 pg (ref 26.0–34.0)
MCHC: 31.6 g/dL (ref 30.0–36.0)
MCV: 89.1 fL (ref 80.0–100.0)
Monocytes Absolute: 0.7 10*3/uL (ref 0.1–1.0)
Monocytes Relative: 9 %
Neutro Abs: 5.1 10*3/uL (ref 1.7–7.7)
Neutrophils Relative %: 73 %
Platelet Count: 284 10*3/uL (ref 150–400)
RBC: 4.4 MIL/uL (ref 3.87–5.11)
RDW: 13.5 % (ref 11.5–15.5)
WBC Count: 7.1 10*3/uL (ref 4.0–10.5)
nRBC: 0 % (ref 0.0–0.2)

## 2018-03-31 MED ORDER — SODIUM CHLORIDE 0.9 % IV SOLN
10.0000 mg/kg | Freq: Once | INTRAVENOUS | Status: AC
  Administered 2018-03-31: 860 mg via INTRAVENOUS
  Filled 2018-03-31: qty 10

## 2018-03-31 MED ORDER — SODIUM CHLORIDE 0.9 % IV SOLN
Freq: Once | INTRAVENOUS | Status: AC
  Administered 2018-03-31: 13:00:00 via INTRAVENOUS
  Filled 2018-03-31: qty 250

## 2018-03-31 NOTE — Progress Notes (Signed)
[name] [phone]   Fax:(336) [phone]  OFFICE PROGRESS NOTE  [name], MD [address] [place] [address]  DIAGNOSIS: stageIIIB (T3, N2, M0)non-small cell lung cancer, adenocarcinoma diagnosed in March 2019 and presented with large right upper lobe lung mass in addition to right hilar and mediastinal lymphadenopathy.  PD-L1 0%  PRIOR THERAPY: Concurrent chemoradiation with weekly carboplatin for an AUC of 2 and paclitaxel 45 mg/m2.  First dose expected on 06/01/2017.  Status post 7 cycles.  Last dose was giving 07/14/2017.  CURRENT THERAPY:  Consolidation treatment with immunotherapy with Imfinzi (Durvalumab) 10 mg/KG every 2 weeks status post 15 cycles..  INTERVAL HISTORY: Ashley Wilson 63 y.o. female returns to the clinic today for follow-up visit.  The patient is feeling fine today with no concerning complaints except for pain at the radiation port site which improved.  She denied having any other chest pain, shortness of breath, cough or hemoptysis.  She denied having any fever or chills.  She has no nausea, vomiting, diarrhea or constipation.  She continues to tolerate her treatment with Imfinzi fairly well.  She is here for evaluation before starting cycle #16.   MEDICAL HISTORY: Past Medical History:  Diagnosis Date  . Compression fracture of lumbar vertebra ([place])   . Hypertension   . NSCL ca dx'd 05/2017    ALLERGIES:  is allergic to lisinopril-hydrochlorothiazide.  MEDICATIONS:  Current Outpatient Medications  Medication Sig Dispense Refill  . acetaminophen (TYLENOL) 500 MG tablet Take 1,000 mg by mouth every 6 (six) hours as needed for mild pain or moderate pain.    Marland Kitchen amLODipine (NORVASC) 10 MG tablet Take 10 mg by mouth daily.    . clotrimazole-betamethasone (LOTRISONE) cream Apply 1 application topically 2 (two) times daily. 45 g 1  . diphenhydrAMINE (BENADRYL) 25 MG tablet Take 1 tablet (25 mg total) by mouth every  4 (four) hours as needed for itching or allergies. 20 tablet 0  . hydrochlorothiazide (HYDRODIURIL) 25 MG tablet Take 1 tablet (25 mg total) by mouth daily. 30 tablet 0  . levothyroxine (SYNTHROID, LEVOTHROID) 50 MCG tablet TAKE 1 TABLET(50 MCG) BY MOUTH DAILY BEFORE BREAKFAST (Patient taking differently: Take 50 mcg by mouth daily before breakfast. ) 30 tablet 1  . loperamide (IMODIUM) 2 MG capsule Take 4 mg by mouth as needed for diarrhea or loose stools.    . predniSONE (DELTASONE) 20 MG tablet 3 tabs po daily x 4 days STARTING 03/06/18 12 tablet 0  . prochlorperazine (COMPAZINE) 5 MG tablet Take 1 tablet by mouth every 6 (six) hours as needed.  0  . ranitidine (ZANTAC) 150 MG tablet Take 1 tablet (150 mg total) by mouth 2 (two) times daily. STARTING TONIGHT and taking x4 days after 10 tablet 0  . rosuvastatin (CRESTOR) 20 MG tablet Take 20 mg by mouth daily.    Marland Kitchen spironolactone (ALDACTONE) 25 MG tablet Take 25 mg by mouth daily.     No current facility-administered medications for this visit.     SURGICAL HISTORY:  Past Surgical History:  Procedure Laterality Date  . BACK SURGERY  2009   ruptured disc    REVIEW OF SYSTEMS:  A comprehensive review of systems was negative.   PHYSICAL EXAMINATION: General appearance: alert, cooperative and no distress Head: Normocephalic, without obvious abnormality, atraumatic Neck: no adenopathy, no JVD, supple, symmetrical, trachea midline and thyroid not enlarged, symmetric, no tenderness/mass/nodules Lymph nodes: Cervical, supraclavicular, and axillary  nodes normal. Resp: clear to auscultation bilaterally Back: symmetric, no curvature. ROM normal. No CVA tenderness. Cardio: regular rate and rhythm, S1, S2 normal, no murmur, click, rub or gallop GI: soft, non-tender; bowel sounds normal; no masses,  no organomegaly Extremities: extremities normal, atraumatic, no cyanosis or edema  ECOG PERFORMANCE STATUS: 0 - Asymptomatic  Blood pressure 114/62,  pulse 79, temperature 98.7 F (37.1 C), temperature source Oral, resp. rate 18, height 5' 3"  (1.6 m), weight 190 lb (86.2 kg), SpO2 97 %.  LABORATORY DATA: Lab Results  Component Value Date   WBC 7.1 03/31/2018   HGB 12.4 03/31/2018   HCT 39.2 03/31/2018   MCV 89.1 03/31/2018   PLT 284 03/31/2018      Chemistry      Component Value Date/Time   NA 140 03/31/2018 1119   K 4.2 03/31/2018 1119   CL 107 03/31/2018 1119   CO2 23 03/31/2018 1119   BUN 18 03/31/2018 1119   CREATININE 1.10 (H) 03/31/2018 1119      Component Value Date/Time   CALCIUM 9.9 03/31/2018 1119   ALKPHOS 120 03/31/2018 1119   AST 19 03/31/2018 1119   ALT 16 03/31/2018 1119   BILITOT 0.2 (L) 03/31/2018 1119       RADIOGRAPHIC STUDIES: No results found.  ASSESSMENT AND PLAN: This is a very pleasant 63 years old [name] with stage IIIB non-small cell lung cancer, adenocarcinoma.   The patient underwent a course of concurrent chemoradiation with weekly carboplatin and paclitaxel status post 7 cycles.  She tolerated this course of treatment fairly well with no concerning complaints.   The patient is currently undergoing treatment with consolidation immunotherapy with Imfinzi (Durvalumab) 10 mg/KG every 2 weeks status post 15 cycles. The patient has been tolerating this treatment well. I recommended for her to proceed with cycle #16 today as scheduled. I will see her back for follow-up visit in 2 weeks for evaluation before starting cycle #17. She was advised to call immediately if she has any concerning symptoms in the interval. The patient voices understanding of current disease status and treatment options and is in agreement with the current care plan. All questions were answered. The patient knows to call the clinic with any problems, questions or concerns. We can certainly see the patient much sooner if necessary.  Disclaimer: This note was dictated with voice recognition software. Similar  sounding words can inadvertently be transcribed and may not be corrected upon review.

## 2018-03-31 NOTE — Patient Instructions (Signed)
[facility] Discharge Instructions for Patients Receiving Chemotherapy  Today you received the following chemotherapy agents: Imfinzi.  To help prevent nausea and vomiting after your treatment, we encourage you to take your nausea medication as directed.   If you develop nausea and vomiting that is not controlled by your nausea medication, call the clinic.   BELOW ARE SYMPTOMS THAT SHOULD BE REPORTED IMMEDIATELY:  *FEVER GREATER THAN 100.5 F  *CHILLS WITH OR WITHOUT FEVER  NAUSEA AND VOMITING THAT IS NOT CONTROLLED WITH YOUR NAUSEA MEDICATION  *UNUSUAL SHORTNESS OF BREATH  *UNUSUAL BRUISING OR BLEEDING  TENDERNESS IN MOUTH AND THROAT WITH OR WITHOUT PRESENCE OF ULCERS  *URINARY PROBLEMS  *BOWEL PROBLEMS  UNUSUAL RASH Items with * indicate a potential emergency and should be followed up as soon as possible.  Feel free to call the clinic should you have any questions or concerns. The clinic phone number is [phone].  Please show the CHEMO ALERT CARD at check-in to the Emergency Department and triage nurse.   

## 2018-04-01 ENCOUNTER — Telehealth: Payer: Self-pay | Admitting: Internal Medicine

## 2018-04-01 NOTE — Telephone Encounter (Signed)
Scheduled appt per 2/12 los - pt to get an updated schedule next visit.

## 2018-04-13 ENCOUNTER — Other Ambulatory Visit (HOSPITAL_COMMUNITY)
Admission: RE | Admit: 2018-04-13 | Discharge: 2018-04-13 | Disposition: A | Discharge status: Home / Self Care | Source: Ambulatory Visit | Attending: Family Medicine | Admitting: Family Medicine

## 2018-04-13 ENCOUNTER — Other Ambulatory Visit: Payer: Self-pay | Admitting: Family Medicine

## 2018-04-13 DIAGNOSIS — N898 Other specified noninflammatory disorders of vagina: Secondary | ICD-10-CM | POA: Insufficient documentation

## 2018-04-14 ENCOUNTER — Inpatient Hospital Stay

## 2018-04-14 ENCOUNTER — Encounter: Payer: Self-pay | Admitting: Internal Medicine

## 2018-04-14 ENCOUNTER — Inpatient Hospital Stay (HOSPITAL_BASED_OUTPATIENT_CLINIC_OR_DEPARTMENT_OTHER): Admitting: Internal Medicine

## 2018-04-14 VITALS — BP 114/69 | HR 76 | Temp 99.0°F | Resp 18 | Ht 63.0 in | Wt 188.9 lb

## 2018-04-14 DIAGNOSIS — C3411 Malignant neoplasm of upper lobe, right bronchus or lung: Secondary | ICD-10-CM

## 2018-04-14 DIAGNOSIS — Z5112 Encounter for antineoplastic immunotherapy: Secondary | ICD-10-CM

## 2018-04-14 DIAGNOSIS — C771 Secondary and unspecified malignant neoplasm of intrathoracic lymph nodes: Secondary | ICD-10-CM

## 2018-04-14 LAB — CMP (CANCER CENTER ONLY)
ALT: 14 U/L (ref 0–44)
AST: 15 U/L (ref 15–41)
Albumin: 3.6 g/dL (ref 3.5–5.0)
Alkaline Phosphatase: 121 U/L (ref 38–126)
Anion gap: 11 (ref 5–15)
BUN: 14 mg/dL (ref 8–23)
CO2: 22 mmol/L (ref 22–32)
Calcium: 9.5 mg/dL (ref 8.9–10.3)
Chloride: 106 mmol/L (ref 98–111)
Creatinine: 0.95 mg/dL (ref 0.44–1.00)
Glucose, Bld: 102 mg/dL — ABNORMAL HIGH (ref 70–99)
Potassium: 4 mmol/L (ref 3.5–5.1)
Sodium: 139 mmol/L (ref 135–145)
Total Bilirubin: 0.3 mg/dL (ref 0.3–1.2)
Total Protein: 8.1 g/dL (ref 6.5–8.1)

## 2018-04-14 LAB — CBC WITH DIFFERENTIAL (CANCER CENTER ONLY)
Abs Immature Granulocytes: 0.07 10*3/uL (ref 0.00–0.07)
Basophils Absolute: 0.1 10*3/uL (ref 0.0–0.1)
Basophils Relative: 1 %
Eosinophils Absolute: 0.1 10*3/uL (ref 0.0–0.5)
Eosinophils Relative: 2 %
HCT: 39.4 % (ref 36.0–46.0)
HEMOGLOBIN: 12.3 g/dL (ref 12.0–15.0)
Immature Granulocytes: 1 %
Lymphocytes Relative: 12 %
Lymphs Abs: 1 10*3/uL (ref 0.7–4.0)
MCH: 27.8 pg (ref 26.0–34.0)
MCHC: 31.2 g/dL (ref 30.0–36.0)
MCV: 89.1 fL (ref 80.0–100.0)
Monocytes Absolute: 0.7 10*3/uL (ref 0.1–1.0)
Monocytes Relative: 9 %
Neutro Abs: 6.3 10*3/uL (ref 1.7–7.7)
Neutrophils Relative %: 75 %
Platelet Count: 239 10*3/uL (ref 150–400)
RBC: 4.42 MIL/uL (ref 3.87–5.11)
RDW: 13.5 % (ref 11.5–15.5)
WBC: 8.3 10*3/uL (ref 4.0–10.5)
nRBC: 0 % (ref 0.0–0.2)

## 2018-04-14 LAB — TSH: TSH: 1.453 u[IU]/mL (ref 0.308–3.960)

## 2018-04-14 MED ORDER — SODIUM CHLORIDE 0.9 % IV SOLN
10.0000 mg/kg | Freq: Once | INTRAVENOUS | Status: AC
  Administered 2018-04-14: 860 mg via INTRAVENOUS
  Filled 2018-04-14: qty 10

## 2018-04-14 MED ORDER — SODIUM CHLORIDE 0.9 % IV SOLN
Freq: Once | INTRAVENOUS | Status: AC
  Administered 2018-04-14: 13:00:00 via INTRAVENOUS
  Filled 2018-04-14: qty 250

## 2018-04-14 NOTE — Progress Notes (Signed)
[place] Telephone:([phone]   Fax:(336) [phone]  OFFICE PROGRESS NOTE  [name], MD [address] [place] [address]  DIAGNOSIS: stageIIIB (T3, N2, M0)non-small cell lung cancer, adenocarcinoma diagnosed in March 2019 and presented with large right upper lobe lung mass in addition to right hilar and mediastinal lymphadenopathy.  PD-L1 0%  PRIOR THERAPY: Concurrent chemoradiation with weekly carboplatin for an AUC of 2 and paclitaxel 45 mg/m2.  First dose expected on 06/01/2017.  Status post 7 cycles.  Last dose was giving 07/14/2017.  CURRENT THERAPY:  Consolidation treatment with immunotherapy with Imfinzi (Durvalumab) 10 mg/KG every 2 weeks status post 16 cycles..  INTERVAL HISTORY: Ashley Wilson 63 y.o. female returns to the clinic today for follow-up visit.  The patient is feeling fine today with no concerning complaints.  She is tolerating her treatment with Imfinzi fairly well.  She denied having any chest pain, shortness of breath, cough or hemoptysis.  She denied having any fever or chills.  She has no nausea, vomiting, diarrhea or constipation.  She is here today for evaluation before starting cycle #17.   MEDICAL HISTORY: Past Medical History:  Diagnosis Date  . Compression fracture of lumbar vertebra ([place])   . Hypertension   . NSCL ca dx'd 05/2017    ALLERGIES:  is allergic to lisinopril-hydrochlorothiazide.  MEDICATIONS:  Current Outpatient Medications  Medication Sig Dispense Refill  . acetaminophen (TYLENOL) 500 MG tablet Take 1,000 mg by mouth every 6 (six) hours as needed for mild pain or moderate pain.    Marland Kitchen amLODipine (NORVASC) 10 MG tablet Take 10 mg by mouth daily.    . clotrimazole-betamethasone (LOTRISONE) cream Apply 1 application topically 2 (two) times daily. 45 g 1  . diphenhydrAMINE (BENADRYL) 25 MG tablet Take 1 tablet (25 mg total) by mouth every 4 (four) hours as needed for itching or allergies. 20 tablet 0    . hydrochlorothiazide (HYDRODIURIL) 25 MG tablet Take 1 tablet (25 mg total) by mouth daily. 30 tablet 0  . levothyroxine (SYNTHROID, LEVOTHROID) 50 MCG tablet TAKE 1 TABLET(50 MCG) BY MOUTH DAILY BEFORE BREAKFAST (Patient taking differently: Take 50 mcg by mouth daily before breakfast. ) 30 tablet 1  . loperamide (IMODIUM) 2 MG capsule Take 4 mg by mouth as needed for diarrhea or loose stools.    . predniSONE (DELTASONE) 20 MG tablet 3 tabs po daily x 4 days STARTING 03/06/18 12 tablet 0  . prochlorperazine (COMPAZINE) 5 MG tablet Take 1 tablet by mouth every 6 (six) hours as needed.  0  . ranitidine (ZANTAC) 150 MG tablet Take 1 tablet (150 mg total) by mouth 2 (two) times daily. STARTING TONIGHT and taking x4 days after 10 tablet 0  . rosuvastatin (CRESTOR) 20 MG tablet Take 20 mg by mouth daily.    Marland Kitchen spironolactone (ALDACTONE) 25 MG tablet Take 25 mg by mouth daily.     No current facility-administered medications for this visit.     SURGICAL HISTORY:  Past Surgical History:  Procedure Laterality Date  . BACK SURGERY  2009   ruptured disc    REVIEW OF SYSTEMS:  A comprehensive review of systems was negative.   PHYSICAL EXAMINATION: General appearance: alert, cooperative and no distress Head: Normocephalic, without obvious abnormality, atraumatic Neck: no adenopathy, no JVD, supple, symmetrical, trachea midline and thyroid not enlarged, symmetric, no tenderness/mass/nodules Lymph nodes: Cervical, supraclavicular, and axillary nodes normal. Resp: clear to auscultation bilaterally Back: symmetric, no  curvature. ROM normal. No CVA tenderness. Cardio: regular rate and rhythm, S1, S2 normal, no murmur, click, rub or gallop GI: soft, non-tender; bowel sounds normal; no masses,  no organomegaly Extremities: extremities normal, atraumatic, no cyanosis or edema  ECOG PERFORMANCE STATUS: 0 - Asymptomatic  Blood pressure 114/69, pulse 76, temperature 99 F (37.2 C), temperature source  Oral, resp. rate 18, height 5' 3"  (1.6 m), weight 188 lb 14.4 oz (85.7 kg), SpO2 98 %.  LABORATORY DATA: Lab Results  Component Value Date   WBC 7.1 03/31/2018   HGB 12.4 03/31/2018   HCT 39.2 03/31/2018   MCV 89.1 03/31/2018   PLT 284 03/31/2018      Chemistry      Component Value Date/Time   NA 140 03/31/2018 1119   K 4.2 03/31/2018 1119   CL 107 03/31/2018 1119   CO2 23 03/31/2018 1119   BUN 18 03/31/2018 1119   CREATININE 1.10 (H) 03/31/2018 1119      Component Value Date/Time   CALCIUM 9.9 03/31/2018 1119   ALKPHOS 120 03/31/2018 1119   AST 19 03/31/2018 1119   ALT 16 03/31/2018 1119   BILITOT 0.2 (L) 03/31/2018 1119       RADIOGRAPHIC STUDIES: No results found.  ASSESSMENT AND PLAN: This is a very pleasant 63 years old [name] with stage IIIB non-small cell lung cancer, adenocarcinoma.   The patient underwent a course of concurrent chemoradiation with weekly carboplatin and paclitaxel status post 7 cycles.  She tolerated this course of treatment fairly well with no concerning complaints.   The patient is currently undergoing treatment with consolidation immunotherapy with Imfinzi (Durvalumab) 10 mg/KG every 2 weeks status post 16 cycles. The patient continues to tolerate this treatment well with no concerning adverse effects. I recommended for her to proceed with cycle #17 today as scheduled. I will see the patient back for follow-up visit in 2 weeks for evaluation before starting the next cycle of her treatment. The patient was advised to call immediately if she has any concerning symptoms in the interval. The patient voices understanding of current disease status and treatment options and is in agreement with the current care plan. All questions were answered. The patient knows to call the clinic with any problems, questions or concerns. We can certainly see the patient much sooner if necessary.  Disclaimer: This note was dictated with voice  recognition software. Similar sounding words can inadvertently be transcribed and may not be corrected upon review.

## 2018-04-14 NOTE — Patient Instructions (Signed)
[facility] Discharge Instructions for Patients Receiving Chemotherapy  Today you received the following chemotherapy agents:  Durvalumab (Imfinzi)  To help prevent nausea and vomiting after your treatment, we encourage you to take your nausea medication as prescribed.   If you develop nausea and vomiting that is not controlled by your nausea medication, call the clinic.   BELOW ARE SYMPTOMS THAT SHOULD BE REPORTED IMMEDIATELY:  *FEVER GREATER THAN 100.5 F  *CHILLS WITH OR WITHOUT FEVER  NAUSEA AND VOMITING THAT IS NOT CONTROLLED WITH YOUR NAUSEA MEDICATION  *UNUSUAL SHORTNESS OF BREATH  *UNUSUAL BRUISING OR BLEEDING  TENDERNESS IN MOUTH AND THROAT WITH OR WITHOUT PRESENCE OF ULCERS  *URINARY PROBLEMS  *BOWEL PROBLEMS  UNUSUAL RASH Items with * indicate a potential emergency and should be followed up as soon as possible.  Feel free to call the clinic should you have any questions or concerns. The clinic phone number is [phone].  Please show the CHEMO ALERT CARD at check-in to the Emergency Department and triage nurse.   

## 2018-04-15 LAB — URINE CYTOLOGY ANCILLARY ONLY
BACTERIAL VAGINITIS: NEGATIVE
Candida vaginitis: NEGATIVE
Chlamydia: NEGATIVE
Neisseria Gonorrhea: NEGATIVE
Trichomonas: NEGATIVE

## 2018-04-28 ENCOUNTER — Other Ambulatory Visit: Payer: Self-pay

## 2018-04-28 ENCOUNTER — Inpatient Hospital Stay: Attending: Internal Medicine

## 2018-04-28 ENCOUNTER — Inpatient Hospital Stay (HOSPITAL_BASED_OUTPATIENT_CLINIC_OR_DEPARTMENT_OTHER): Admitting: Internal Medicine

## 2018-04-28 ENCOUNTER — Inpatient Hospital Stay

## 2018-04-28 ENCOUNTER — Encounter: Payer: Self-pay | Admitting: Internal Medicine

## 2018-04-28 ENCOUNTER — Telehealth: Payer: Self-pay | Admitting: Internal Medicine

## 2018-04-28 VITALS — BP 112/66 | HR 89 | Temp 98.5°F | Resp 18 | Ht 63.0 in | Wt 190.0 lb

## 2018-04-28 DIAGNOSIS — Z923 Personal history of irradiation: Secondary | ICD-10-CM | POA: Diagnosis not present

## 2018-04-28 DIAGNOSIS — Z79899 Other long term (current) drug therapy: Secondary | ICD-10-CM | POA: Diagnosis not present

## 2018-04-28 DIAGNOSIS — I700 Atherosclerosis of aorta: Secondary | ICD-10-CM | POA: Diagnosis not present

## 2018-04-28 DIAGNOSIS — Z5112 Encounter for antineoplastic immunotherapy: Secondary | ICD-10-CM

## 2018-04-28 DIAGNOSIS — C3411 Malignant neoplasm of upper lobe, right bronchus or lung: Secondary | ICD-10-CM | POA: Insufficient documentation

## 2018-04-28 DIAGNOSIS — I10 Essential (primary) hypertension: Secondary | ICD-10-CM | POA: Insufficient documentation

## 2018-04-28 DIAGNOSIS — C3490 Malignant neoplasm of unspecified part of unspecified bronchus or lung: Secondary | ICD-10-CM

## 2018-04-28 DIAGNOSIS — Z9221 Personal history of antineoplastic chemotherapy: Secondary | ICD-10-CM | POA: Diagnosis not present

## 2018-04-28 DIAGNOSIS — Z7952 Long term (current) use of systemic steroids: Secondary | ICD-10-CM | POA: Diagnosis not present

## 2018-04-28 DIAGNOSIS — I2510 Atherosclerotic heart disease of native coronary artery without angina pectoris: Secondary | ICD-10-CM | POA: Insufficient documentation

## 2018-04-28 LAB — CBC WITH DIFFERENTIAL (CANCER CENTER ONLY)
Abs Immature Granulocytes: 0.08 10*3/uL — ABNORMAL HIGH (ref 0.00–0.07)
Basophils Absolute: 0.1 10*3/uL (ref 0.0–0.1)
Basophils Relative: 1 %
Eosinophils Absolute: 0.2 10*3/uL (ref 0.0–0.5)
Eosinophils Relative: 3 %
HCT: 38.6 % (ref 36.0–46.0)
HEMOGLOBIN: 12.1 g/dL (ref 12.0–15.0)
Immature Granulocytes: 1 %
LYMPHS PCT: 14 %
Lymphs Abs: 1 10*3/uL (ref 0.7–4.0)
MCH: 27.7 pg (ref 26.0–34.0)
MCHC: 31.3 g/dL (ref 30.0–36.0)
MCV: 88.3 fL (ref 80.0–100.0)
Monocytes Absolute: 0.5 10*3/uL (ref 0.1–1.0)
Monocytes Relative: 7 %
Neutro Abs: 5.5 10*3/uL (ref 1.7–7.7)
Neutrophils Relative %: 74 %
Platelet Count: 315 10*3/uL (ref 150–400)
RBC: 4.37 MIL/uL (ref 3.87–5.11)
RDW: 13.3 % (ref 11.5–15.5)
WBC: 7.3 10*3/uL (ref 4.0–10.5)
nRBC: 0 % (ref 0.0–0.2)

## 2018-04-28 LAB — CMP (CANCER CENTER ONLY)
ALK PHOS: 110 U/L (ref 38–126)
ALT: 13 U/L (ref 0–44)
ANION GAP: 14 (ref 5–15)
AST: 14 U/L — ABNORMAL LOW (ref 15–41)
Albumin: 3.5 g/dL (ref 3.5–5.0)
BUN: 15 mg/dL (ref 8–23)
CO2: 22 mmol/L (ref 22–32)
Calcium: 9.6 mg/dL (ref 8.9–10.3)
Chloride: 107 mmol/L (ref 98–111)
Creatinine: 1.15 mg/dL — ABNORMAL HIGH (ref 0.44–1.00)
GFR, Est AFR Am: 58 mL/min — ABNORMAL LOW (ref >60)
GFR, Estimated: 50 mL/min — ABNORMAL LOW (ref >60)
Glucose, Bld: 123 mg/dL — ABNORMAL HIGH (ref 70–99)
Potassium: 4.1 mmol/L (ref 3.5–5.1)
Sodium: 143 mmol/L (ref 135–145)
Total Bilirubin: 0.4 mg/dL (ref 0.3–1.2)
Total Protein: 8.1 g/dL (ref 6.5–8.1)

## 2018-04-28 MED ORDER — SODIUM CHLORIDE 0.9 % IV SOLN
10.0000 mg/kg | Freq: Once | INTRAVENOUS | Status: AC
  Administered 2018-04-28: 860 mg via INTRAVENOUS
  Filled 2018-04-28: qty 7.2

## 2018-04-28 MED ORDER — SODIUM CHLORIDE 0.9 % IV SOLN
Freq: Once | INTRAVENOUS | Status: AC
  Administered 2018-04-28: 11:00:00 via INTRAVENOUS
  Filled 2018-04-28: qty 250

## 2018-04-28 NOTE — Progress Notes (Signed)
[place] [phone]) [phone]   Fax:(336) [phone]  OFFICE PROGRESS NOTE  [name], MD [address] [place] [address]  DIAGNOSIS: stageIIIB (T3, N2, M0)non-small cell lung cancer, adenocarcinoma diagnosed in March 2019 and presented with large right upper lobe lung mass in addition to right hilar and mediastinal lymphadenopathy.  PD-L1 0%  PRIOR THERAPY: Concurrent chemoradiation with weekly carboplatin for an AUC of 2 and paclitaxel 45 mg/m2.  First dose expected on 06/01/2017.  Status post 7 cycles.  Last dose was giving 07/14/2017.  CURRENT THERAPY:  Consolidation treatment with immunotherapy with Imfinzi (Durvalumab) 10 mg/KG every 2 weeks status post 17 cycles..  INTERVAL HISTORY: Ashley Wilson 63 y.o. female returns to the clinic today for follow-up visit accompanied by her granddaughter.  The patient is feeling fine today with no concerning complaints.  She denied having any chest pain, shortness of breath, cough or hemoptysis.  She denied having any nausea, vomiting, diarrhea or constipation.  She has no headache or visual changes.  She has been tolerating her consolidation treatment with Imfinzi fairly well.  She is here for evaluation before starting cycle #18.   MEDICAL HISTORY: Past Medical History:  Diagnosis Date  . Compression fracture of lumbar vertebra ([place])   . Hypertension   . NSCL ca dx'd 05/2017    ALLERGIES:  is allergic to lisinopril-hydrochlorothiazide.  MEDICATIONS:  Current Outpatient Medications  Medication Sig Dispense Refill  . acetaminophen (TYLENOL) 500 MG tablet Take 1,000 mg by mouth every 6 (six) hours as needed for mild pain or moderate pain.    Marland Kitchen amLODipine (NORVASC) 10 MG tablet Take 10 mg by mouth daily.    . clotrimazole-betamethasone (LOTRISONE) cream Apply 1 application topically 2 (two) times daily. 45 g 1  . diphenhydrAMINE (BENADRYL) 25 MG tablet Take 1 tablet (25 mg total) by mouth every 4  (four) hours as needed for itching or allergies. 20 tablet 0  . hydrochlorothiazide (HYDRODIURIL) 25 MG tablet Take 1 tablet (25 mg total) by mouth daily. 30 tablet 0  . levothyroxine (SYNTHROID, LEVOTHROID) 50 MCG tablet TAKE 1 TABLET(50 MCG) BY MOUTH DAILY BEFORE BREAKFAST (Patient taking differently: Take 50 mcg by mouth daily before breakfast. ) 30 tablet 1  . loperamide (IMODIUM) 2 MG capsule Take 4 mg by mouth as needed for diarrhea or loose stools.    . predniSONE (DELTASONE) 20 MG tablet 3 tabs po daily x 4 days STARTING 03/06/18 12 tablet 0  . prochlorperazine (COMPAZINE) 5 MG tablet Take 1 tablet by mouth every 6 (six) hours as needed.  0  . ranitidine (ZANTAC) 150 MG tablet Take 1 tablet (150 mg total) by mouth 2 (two) times daily. STARTING TONIGHT and taking x4 days after 10 tablet 0  . rosuvastatin (CRESTOR) 20 MG tablet Take 20 mg by mouth daily.    Marland Kitchen spironolactone (ALDACTONE) 25 MG tablet Take 25 mg by mouth daily.     No current facility-administered medications for this visit.     SURGICAL HISTORY:  Past Surgical History:  Procedure Laterality Date  . BACK SURGERY  2009   ruptured disc    REVIEW OF SYSTEMS:  A comprehensive review of systems was negative.   PHYSICAL EXAMINATION: General appearance: alert, cooperative and no distress Head: Normocephalic, without obvious abnormality, atraumatic Neck: no adenopathy, no JVD, supple, symmetrical, trachea midline and thyroid not enlarged, symmetric, no tenderness/mass/nodules Lymph nodes: Cervical, supraclavicular, and axillary nodes normal. Resp: clear to  auscultation bilaterally Back: symmetric, no curvature. ROM normal. No CVA tenderness. Cardio: regular rate and rhythm, S1, S2 normal, no murmur, click, rub or gallop GI: soft, non-tender; bowel sounds normal; no masses,  no organomegaly Extremities: extremities normal, atraumatic, no cyanosis or edema  ECOG PERFORMANCE STATUS: 0 - Asymptomatic  Blood pressure 112/66,  pulse 89, temperature 98.5 F (36.9 C), temperature source Oral, resp. rate 18, height 5' 3"  (1.6 m), weight 190 lb (86.2 kg), SpO2 98 %.  LABORATORY DATA: Lab Results  Component Value Date   WBC 7.3 04/28/2018   HGB 12.1 04/28/2018   HCT 38.6 04/28/2018   MCV 88.3 04/28/2018   PLT 315 04/28/2018      Chemistry      Component Value Date/Time   NA 143 04/28/2018 0940   K 4.1 04/28/2018 0940   CL 107 04/28/2018 0940   CO2 22 04/28/2018 0940   BUN 15 04/28/2018 0940   CREATININE 1.15 (H) 04/28/2018 0940      Component Value Date/Time   CALCIUM 9.6 04/28/2018 0940   ALKPHOS 110 04/28/2018 0940   AST 14 (L) 04/28/2018 0940   ALT 13 04/28/2018 0940   BILITOT 0.4 04/28/2018 0940       RADIOGRAPHIC STUDIES: No results found.  ASSESSMENT AND PLAN: This is a very pleasant 63 years old [name] with stage IIIB non-small cell lung cancer, adenocarcinoma.   The patient underwent a course of concurrent chemoradiation with weekly carboplatin and paclitaxel status post 7 cycles.  She tolerated this course of treatment fairly well with no concerning complaints.   The patient is currently undergoing treatment with consolidation immunotherapy with Imfinzi (Durvalumab) 10 mg/KG every 2 weeks status post 17 cycles. I recommended for the patient to proceed with cycle #18 today as scheduled. I will see her back for follow-up visit in 2 weeks for evaluation after repeating CT scan of the chest for restaging of her disease. The patient was advised to call immediately if she has any concerning symptoms in the interval. The patient voices understanding of current disease status and treatment options and is in agreement with the current care plan. All questions were answered. The patient knows to call the clinic with any problems, questions or concerns. We can certainly see the patient much sooner if necessary.  Disclaimer: This note was dictated with voice recognition software.  Similar sounding words can inadvertently be transcribed and may not be corrected upon review.

## 2018-04-28 NOTE — Telephone Encounter (Signed)
Scheduled appt per 3/11 los - pt to get an updated next visit - additional appt added

## 2018-04-28 NOTE — Patient Instructions (Signed)
[facility] Discharge Instructions for Patients Receiving Chemotherapy  Today you received the following chemotherapy agents Durvalumab (IMFINZI).  To help prevent nausea and vomiting after your treatment, we encourage you to take your nausea medication as prescribed.   If you develop nausea and vomiting that is not controlled by your nausea medication, call the clinic.   BELOW ARE SYMPTOMS THAT SHOULD BE REPORTED IMMEDIATELY:  *FEVER GREATER THAN 100.5 F  *CHILLS WITH OR WITHOUT FEVER  NAUSEA AND VOMITING THAT IS NOT CONTROLLED WITH YOUR NAUSEA MEDICATION  *UNUSUAL SHORTNESS OF BREATH  *UNUSUAL BRUISING OR BLEEDING  TENDERNESS IN MOUTH AND THROAT WITH OR WITHOUT PRESENCE OF ULCERS  *URINARY PROBLEMS  *BOWEL PROBLEMS  UNUSUAL RASH Items with * indicate a potential emergency and should be followed up as soon as possible.  Feel free to call the clinic should you have any questions or concerns. The clinic phone number is [phone].  Please show the [facility] at check-in to the Emergency Department and triage nurse.

## 2018-05-10 ENCOUNTER — Encounter (HOSPITAL_COMMUNITY): Payer: Self-pay

## 2018-05-10 ENCOUNTER — Other Ambulatory Visit: Payer: Self-pay

## 2018-05-10 ENCOUNTER — Ambulatory Visit (HOSPITAL_COMMUNITY)
Admission: RE | Admit: 2018-05-10 | Discharge: 2018-05-10 | Disposition: A | Discharge status: Home / Self Care | Source: Ambulatory Visit | Attending: Internal Medicine | Admitting: Internal Medicine

## 2018-05-10 DIAGNOSIS — C3490 Malignant neoplasm of unspecified part of unspecified bronchus or lung: Secondary | ICD-10-CM | POA: Diagnosis not present

## 2018-05-10 MED ORDER — SODIUM CHLORIDE (PF) 0.9 % IJ SOLN
INTRAMUSCULAR | Status: AC
  Filled 2018-05-10: qty 50

## 2018-05-10 MED ORDER — IOHEXOL 300 MG/ML  SOLN
75.0000 mL | Freq: Once | INTRAMUSCULAR | Status: AC | PRN
  Administered 2018-05-10: 75 mL via INTRAVENOUS

## 2018-05-12 ENCOUNTER — Encounter: Payer: Self-pay | Admitting: Internal Medicine

## 2018-05-12 ENCOUNTER — Inpatient Hospital Stay

## 2018-05-12 ENCOUNTER — Other Ambulatory Visit: Payer: Self-pay

## 2018-05-12 ENCOUNTER — Inpatient Hospital Stay (HOSPITAL_BASED_OUTPATIENT_CLINIC_OR_DEPARTMENT_OTHER): Admitting: Internal Medicine

## 2018-05-12 VITALS — BP 120/63 | HR 83 | Temp 97.8°F | Resp 18 | Ht 63.0 in | Wt 195.3 lb

## 2018-05-12 DIAGNOSIS — Z7952 Long term (current) use of systemic steroids: Secondary | ICD-10-CM

## 2018-05-12 DIAGNOSIS — E032 Hypothyroidism due to medicaments and other exogenous substances: Secondary | ICD-10-CM

## 2018-05-12 DIAGNOSIS — Z923 Personal history of irradiation: Secondary | ICD-10-CM

## 2018-05-12 DIAGNOSIS — Z9221 Personal history of antineoplastic chemotherapy: Secondary | ICD-10-CM | POA: Diagnosis not present

## 2018-05-12 DIAGNOSIS — I10 Essential (primary) hypertension: Secondary | ICD-10-CM | POA: Diagnosis not present

## 2018-05-12 DIAGNOSIS — Z5112 Encounter for antineoplastic immunotherapy: Secondary | ICD-10-CM | POA: Diagnosis not present

## 2018-05-12 DIAGNOSIS — C3411 Malignant neoplasm of upper lobe, right bronchus or lung: Secondary | ICD-10-CM

## 2018-05-12 DIAGNOSIS — Z79899 Other long term (current) drug therapy: Secondary | ICD-10-CM

## 2018-05-12 DIAGNOSIS — I700 Atherosclerosis of aorta: Secondary | ICD-10-CM

## 2018-05-12 DIAGNOSIS — I2510 Atherosclerotic heart disease of native coronary artery without angina pectoris: Secondary | ICD-10-CM

## 2018-05-12 LAB — CMP (CANCER CENTER ONLY)
ALT: 15 U/L (ref 0–44)
AST: 16 U/L (ref 15–41)
Albumin: 3.7 g/dL (ref 3.5–5.0)
Alkaline Phosphatase: 114 U/L (ref 38–126)
Anion gap: 11 (ref 5–15)
BILIRUBIN TOTAL: 0.3 mg/dL (ref 0.3–1.2)
BUN: 21 mg/dL (ref 8–23)
CO2: 22 mmol/L (ref 22–32)
Calcium: 9.3 mg/dL (ref 8.9–10.3)
Chloride: 106 mmol/L (ref 98–111)
Creatinine: 1.15 mg/dL — ABNORMAL HIGH (ref 0.44–1.00)
GFR, Est AFR Am: 58 mL/min — ABNORMAL LOW (ref >60)
GFR, Estimated: 50 mL/min — ABNORMAL LOW (ref >60)
GLUCOSE: 106 mg/dL — AB (ref 70–99)
Potassium: 4.5 mmol/L (ref 3.5–5.1)
Sodium: 139 mmol/L (ref 135–145)
TOTAL PROTEIN: 8.1 g/dL (ref 6.5–8.1)

## 2018-05-12 LAB — CBC WITH DIFFERENTIAL (CANCER CENTER ONLY)
Abs Immature Granulocytes: 0.05 10*3/uL (ref 0.00–0.07)
Basophils Absolute: 0.1 10*3/uL (ref 0.0–0.1)
Basophils Relative: 1 %
Eosinophils Absolute: 0.2 10*3/uL (ref 0.0–0.5)
Eosinophils Relative: 3 %
HEMATOCRIT: 38 % (ref 36.0–46.0)
Hemoglobin: 12.1 g/dL (ref 12.0–15.0)
Immature Granulocytes: 1 %
Lymphocytes Relative: 21 %
Lymphs Abs: 1.6 10*3/uL (ref 0.7–4.0)
MCH: 27.8 pg (ref 26.0–34.0)
MCHC: 31.8 g/dL (ref 30.0–36.0)
MCV: 87.4 fL (ref 80.0–100.0)
Monocytes Absolute: 0.6 10*3/uL (ref 0.1–1.0)
Monocytes Relative: 9 %
Neutro Abs: 4.7 10*3/uL (ref 1.7–7.7)
Neutrophils Relative %: 65 %
Platelet Count: 251 10*3/uL (ref 150–400)
RBC: 4.35 MIL/uL (ref 3.87–5.11)
RDW: 13.7 % (ref 11.5–15.5)
WBC Count: 7.2 10*3/uL (ref 4.0–10.5)
nRBC: 0 % (ref 0.0–0.2)

## 2018-05-12 LAB — TSH: TSH: 6.917 u[IU]/mL — ABNORMAL HIGH (ref 0.308–3.960)

## 2018-05-12 MED ORDER — SODIUM CHLORIDE 0.9 % IV SOLN
Freq: Once | INTRAVENOUS | Status: AC
  Administered 2018-05-12: 11:00:00 via INTRAVENOUS
  Filled 2018-05-12: qty 250

## 2018-05-12 MED ORDER — SODIUM CHLORIDE 0.9 % IV SOLN
10.0000 mg/kg | Freq: Once | INTRAVENOUS | Status: AC
  Administered 2018-05-12: 860 mg via INTRAVENOUS
  Filled 2018-05-12: qty 7.2

## 2018-05-12 NOTE — Patient Instructions (Signed)
[facility] Discharge Instructions for Patients Receiving Chemotherapy  Today you received the following chemotherapy agents Durvalumab (IMFINZI).  To help prevent nausea and vomiting after your treatment, we encourage you to take your nausea medication as prescribed.   If you develop nausea and vomiting that is not controlled by your nausea medication, call the clinic.   BELOW ARE SYMPTOMS THAT SHOULD BE REPORTED IMMEDIATELY:  *FEVER GREATER THAN 100.5 F  *CHILLS WITH OR WITHOUT FEVER  NAUSEA AND VOMITING THAT IS NOT CONTROLLED WITH YOUR NAUSEA MEDICATION  *UNUSUAL SHORTNESS OF BREATH  *UNUSUAL BRUISING OR BLEEDING  TENDERNESS IN MOUTH AND THROAT WITH OR WITHOUT PRESENCE OF ULCERS  *URINARY PROBLEMS  *BOWEL PROBLEMS  UNUSUAL RASH Items with * indicate a potential emergency and should be followed up as soon as possible.  Feel free to call the clinic should you have any questions or concerns. The clinic phone number is [phone].  Please show the [facility] at check-in to the Emergency Department and triage nurse.

## 2018-05-12 NOTE — Progress Notes (Signed)
[name] [phone]) [phone]   Fax:(336) [phone]  OFFICE PROGRESS NOTE  [name], MD [address] [place] [address]  DIAGNOSIS: stageIIIB (T3, N2, M0)non-small cell lung cancer, adenocarcinoma diagnosed in March 2019 and presented with large right upper lobe lung mass in addition to right hilar and mediastinal lymphadenopathy.  PD-L1 0%  PRIOR THERAPY: Concurrent chemoradiation with weekly carboplatin for an AUC of 2 and paclitaxel 45 mg/m2.  First dose expected on 06/01/2017.  Status post 7 cycles.  Last dose was giving 07/14/2017.  CURRENT THERAPY:  Consolidation treatment with immunotherapy with Imfinzi (Durvalumab) 10 mg/KG every 2 weeks status post 18 cycles..  INTERVAL HISTORY: Ashley Wilson 63 y.o. female returns to the clinic today for follow-up visit.  The patient is feeling fine today with no concerning complaints.  She denied having any chest pain, shortness of breath, cough or hemoptysis.  She denied having any fever or chills.  She has no nausea, vomiting, diarrhea or constipation.  She denied having any headache or visual changes.  She has been tolerating her treatment with Imfinzi fairly well.  The patient had repeat CT scan of the chest performed recently and she is here for evaluation and discussion of her scan results.   MEDICAL HISTORY: Past Medical History:  Diagnosis Date   Compression fracture of lumbar vertebra ([place])    Hypertension    NSCL ca dx'd 05/2017    ALLERGIES:  is allergic to lisinopril-hydrochlorothiazide.  MEDICATIONS:  Current Outpatient Medications  Medication Sig Dispense Refill   acetaminophen (TYLENOL) 500 MG tablet Take 1,000 mg by mouth every 6 (six) hours as needed for mild pain or moderate pain.     amLODipine (NORVASC) 10 MG tablet Take 10 mg by mouth daily.     clotrimazole-betamethasone (LOTRISONE) cream Apply 1 application topically 2 (two) times daily. 45 g 1   diphenhydrAMINE  (BENADRYL) 25 MG tablet Take 1 tablet (25 mg total) by mouth every 4 (four) hours as needed for itching or allergies. 20 tablet 0   hydrochlorothiazide (HYDRODIURIL) 25 MG tablet Take 1 tablet (25 mg total) by mouth daily. 30 tablet 0   levothyroxine (SYNTHROID, LEVOTHROID) 50 MCG tablet TAKE 1 TABLET(50 MCG) BY MOUTH DAILY BEFORE BREAKFAST (Patient taking differently: Take 50 mcg by mouth daily before breakfast. ) 30 tablet 1   loperamide (IMODIUM) 2 MG capsule Take 4 mg by mouth as needed for diarrhea or loose stools.     predniSONE (DELTASONE) 20 MG tablet 3 tabs po daily x 4 days STARTING 03/06/18 12 tablet 0   prochlorperazine (COMPAZINE) 5 MG tablet Take 1 tablet by mouth every 6 (six) hours as needed.  0   ranitidine (ZANTAC) 150 MG tablet Take 1 tablet (150 mg total) by mouth 2 (two) times daily. STARTING TONIGHT and taking x4 days after 10 tablet 0   rosuvastatin (CRESTOR) 20 MG tablet Take 20 mg by mouth daily.     spironolactone (ALDACTONE) 25 MG tablet Take 25 mg by mouth daily.     No current facility-administered medications for this visit.     SURGICAL HISTORY:  Past Surgical History:  Procedure Laterality Date   BACK SURGERY  2009   ruptured disc    REVIEW OF SYSTEMS:  Constitutional: negative Eyes: negative Ears, nose, mouth, throat, and face: negative Respiratory: negative Cardiovascular: negative Gastrointestinal: negative Genitourinary:negative Integument/breast: negative Hematologic/lymphatic: negative Musculoskeletal:negative Neurological: negative Behavioral/Psych: negative Endocrine: negative Allergic/Immunologic: negative   PHYSICAL  EXAMINATION: General appearance: alert, cooperative and no distress Head: Normocephalic, without obvious abnormality, atraumatic Neck: no adenopathy, no JVD, supple, symmetrical, trachea midline and thyroid not enlarged, symmetric, no tenderness/mass/nodules Lymph nodes: Cervical, supraclavicular, and axillary nodes  normal. Resp: clear to auscultation bilaterally Back: symmetric, no curvature. ROM normal. No CVA tenderness. Cardio: regular rate and rhythm, S1, S2 normal, no murmur, click, rub or gallop GI: soft, non-tender; bowel sounds normal; no masses,  no organomegaly Extremities: extremities normal, atraumatic, no cyanosis or edema Neurologic: Alert and oriented X 3, normal strength and tone. Normal symmetric reflexes. Normal coordination and gait  ECOG PERFORMANCE STATUS: 0 - Asymptomatic  Blood pressure 120/63, pulse 83, temperature 97.8 F (36.6 C), temperature source Oral, resp. rate 18, height 5' 3"  (1.6 m), weight 195 lb 4.8 oz (88.6 kg), SpO2 100 %.  LABORATORY DATA: Lab Results  Component Value Date   WBC 7.2 05/12/2018   HGB 12.1 05/12/2018   HCT 38.0 05/12/2018   MCV 87.4 05/12/2018   PLT 251 05/12/2018      Chemistry      Component Value Date/Time   NA 143 04/28/2018 0940   K 4.1 04/28/2018 0940   CL 107 04/28/2018 0940   CO2 22 04/28/2018 0940   BUN 15 04/28/2018 0940   CREATININE 1.15 (H) 04/28/2018 0940      Component Value Date/Time   CALCIUM 9.6 04/28/2018 0940   ALKPHOS 110 04/28/2018 0940   AST 14 (L) 04/28/2018 0940   ALT 13 04/28/2018 0940   BILITOT 0.4 04/28/2018 0940       RADIOGRAPHIC STUDIES: Ct Chest W Contrast  Result Date: 05/10/2018 CLINICAL DATA:  63 year old female with history of lung cancer diagnosed in 2019 status post chemotherapy and radiation therapy now complete. No current chest complaints. EXAM: CT CHEST WITH CONTRAST TECHNIQUE: Multidetector CT imaging of the chest was performed during intravenous contrast administration. CONTRAST:  73m OMNIPAQUE IOHEXOL 300 MG/ML  SOLN COMPARISON:  Chest CT 02/15/2018. FINDINGS: Cardiovascular: Heart size is normal. There is no significant pericardial fluid, thickening or pericardial calcification. There is aortic atherosclerosis, as well as atherosclerosis of the great vessels of the mediastinum and  the coronary arteries, including calcified atherosclerotic plaque in the left main, left anterior descending and right coronary arteries. Mediastinum/Nodes: No pathologically enlarged mediastinal or hilar lymph nodes. Esophagus is unremarkable in appearance. No axillary lymphadenopathy. Lungs/Pleura: The previously treated mass in the lateral aspect of the right upper lobe near the apex appears similar to the prior study, currently measuring 3.7 x 5.0 cm (axial image 34 of series 2) as compared with 3.8 x 4.9 cm on the prior examination. There are some surrounding areas of septal thickening and scattered areas of patchy ground-glass attenuation in adjacent portions of the upper right lung, most compatible with evolving postradiation changes. No definite new suspicious appearing pulmonary nodules or masses are noted. No acute consolidative airspace disease. No pleural effusions. Upper Abdomen: Incompletely imaged exophytic low-attenuation lesion in the anterior aspect of the upper pole of the left kidney measuring approximately 6.8 cm, similar to the prior study, not characterized but strongly favored to represent an exophytic cyst. Musculoskeletal: There are no aggressive appearing lytic or blastic lesions noted in the visualized portions of the skeleton. IMPRESSION: 1. Stable size and appearance of treated mass in the right upper lobe near the apex. The stability of this lesion is reassuring, however, close attention on follow-up studies is recommended 2. No hilar or mediastinal lymphadenopathy noted at this time. 3.  Aortic atherosclerosis, in addition to left main and 2 vessel coronary artery disease. Please note that although the presence of coronary artery calcium documents the presence of coronary artery disease, the severity of this disease and any potential stenosis cannot be assessed on this non-gated CT examination. Assessment for potential risk factor modification, dietary therapy or pharmacologic therapy  may be warranted, if clinically indicated. Aortic Atherosclerosis (ICD10-I70.0). Electronically Signed   By: Vinnie Langton M.D.   On: 05/10/2018 08:08    ASSESSMENT AND PLAN: This is a very pleasant 63 years old [name] with stage IIIB non-small cell lung cancer, adenocarcinoma.   The patient underwent a course of concurrent chemoradiation with weekly carboplatin and paclitaxel status post 7 cycles.  She tolerated this course of treatment fairly well with no concerning complaints.   The patient is currently undergoing treatment with consolidation immunotherapy with Imfinzi (Durvalumab) 10 mg/KG every 2 weeks status post 18 cycles. The patient has been tolerating this treatment well with no concerning adverse effects. She had repeat CT scan of the chest performed recently.  I personally and independently reviewed the scan images and discussed the result and showed the images to the patient today. Her scan showed no concerning findings for disease progression. I recommended for her to proceed with cycle #19 today as scheduled. She will come back for follow-up visit in 2 weeks for evaluation before the next cycle of her treatment. The patient was advised to call immediately if she has any concerning symptoms in the interval. The patient voices understanding of current disease status and treatment options and is in agreement with the current care plan. All questions were answered. The patient knows to call the clinic with any problems, questions or concerns. We can certainly see the patient much sooner if necessary.  Disclaimer: This note was dictated with voice recognition software. Similar sounding words can inadvertently be transcribed and may not be corrected upon review.

## 2018-05-26 ENCOUNTER — Inpatient Hospital Stay: Attending: Internal Medicine

## 2018-05-26 ENCOUNTER — Inpatient Hospital Stay

## 2018-05-26 ENCOUNTER — Other Ambulatory Visit: Payer: Self-pay

## 2018-05-26 ENCOUNTER — Inpatient Hospital Stay (HOSPITAL_BASED_OUTPATIENT_CLINIC_OR_DEPARTMENT_OTHER): Admitting: Internal Medicine

## 2018-05-26 ENCOUNTER — Encounter: Payer: Self-pay | Admitting: Internal Medicine

## 2018-05-26 VITALS — BP 115/65 | HR 82 | Temp 98.5°F | Resp 18 | Ht 63.0 in | Wt 194.9 lb

## 2018-05-26 DIAGNOSIS — C3411 Malignant neoplasm of upper lobe, right bronchus or lung: Secondary | ICD-10-CM

## 2018-05-26 DIAGNOSIS — Z5112 Encounter for antineoplastic immunotherapy: Secondary | ICD-10-CM | POA: Diagnosis present

## 2018-05-26 DIAGNOSIS — I10 Essential (primary) hypertension: Secondary | ICD-10-CM | POA: Insufficient documentation

## 2018-05-26 DIAGNOSIS — Z79899 Other long term (current) drug therapy: Secondary | ICD-10-CM | POA: Insufficient documentation

## 2018-05-26 LAB — CMP (CANCER CENTER ONLY)
ALT: 13 U/L (ref 0–44)
AST: 16 U/L (ref 15–41)
Albumin: 3.7 g/dL (ref 3.5–5.0)
Alkaline Phosphatase: 121 U/L (ref 38–126)
Anion gap: 11 (ref 5–15)
BUN: 22 mg/dL (ref 8–23)
CO2: 21 mmol/L — ABNORMAL LOW (ref 22–32)
Calcium: 9.4 mg/dL (ref 8.9–10.3)
Chloride: 108 mmol/L (ref 98–111)
Creatinine: 1.1 mg/dL — ABNORMAL HIGH (ref 0.44–1.00)
GFR, Est AFR Am: >60 mL/min (ref >60)
GFR, Estimated: 53 mL/min — ABNORMAL LOW (ref >60)
Glucose, Bld: 110 mg/dL — ABNORMAL HIGH (ref 70–99)
Potassium: 4.5 mmol/L (ref 3.5–5.1)
Sodium: 140 mmol/L (ref 135–145)
Total Bilirubin: 0.3 mg/dL (ref 0.3–1.2)
Total Protein: 8 g/dL (ref 6.5–8.1)

## 2018-05-26 LAB — CBC WITH DIFFERENTIAL (CANCER CENTER ONLY)
Abs Immature Granulocytes: 0.04 10*3/uL (ref 0.00–0.07)
Basophils Absolute: 0.1 10*3/uL (ref 0.0–0.1)
Basophils Relative: 1 %
Eosinophils Absolute: 0.2 10*3/uL (ref 0.0–0.5)
Eosinophils Relative: 4 %
HCT: 39.5 % (ref 36.0–46.0)
Hemoglobin: 12.3 g/dL (ref 12.0–15.0)
Immature Granulocytes: 1 %
Lymphocytes Relative: 19 %
Lymphs Abs: 1.2 10*3/uL (ref 0.7–4.0)
MCH: 27.4 pg (ref 26.0–34.0)
MCHC: 31.1 g/dL (ref 30.0–36.0)
MCV: 88 fL (ref 80.0–100.0)
Monocytes Absolute: 0.6 10*3/uL (ref 0.1–1.0)
Monocytes Relative: 10 %
Neutro Abs: 4.1 10*3/uL (ref 1.7–7.7)
Neutrophils Relative %: 65 %
Platelet Count: 262 10*3/uL (ref 150–400)
RBC: 4.49 MIL/uL (ref 3.87–5.11)
RDW: 14.2 % (ref 11.5–15.5)
WBC Count: 6.2 10*3/uL (ref 4.0–10.5)
nRBC: 0 % (ref 0.0–0.2)

## 2018-05-26 MED ORDER — SODIUM CHLORIDE 0.9 % IV SOLN
10.0000 mg/kg | Freq: Once | INTRAVENOUS | Status: AC
  Administered 2018-05-26: 860 mg via INTRAVENOUS
  Filled 2018-05-26: qty 10

## 2018-05-26 MED ORDER — SODIUM CHLORIDE 0.9 % IV SOLN
Freq: Once | INTRAVENOUS | Status: AC
  Administered 2018-05-26: 11:00:00 via INTRAVENOUS
  Filled 2018-05-26: qty 250

## 2018-05-26 NOTE — Patient Instructions (Signed)
[facility] Discharge Instructions for Patients Receiving Chemotherapy  Today you received the following chemotherapy agents: Imfinzi.  To help prevent nausea and vomiting after your treatment, we encourage you to take your nausea medication as directed.   If you develop nausea and vomiting that is not controlled by your nausea medication, call the clinic.   BELOW ARE SYMPTOMS THAT SHOULD BE REPORTED IMMEDIATELY:  *FEVER GREATER THAN 100.5 F  *CHILLS WITH OR WITHOUT FEVER  NAUSEA AND VOMITING THAT IS NOT CONTROLLED WITH YOUR NAUSEA MEDICATION  *UNUSUAL SHORTNESS OF BREATH  *UNUSUAL BRUISING OR BLEEDING  TENDERNESS IN MOUTH AND THROAT WITH OR WITHOUT PRESENCE OF ULCERS  *URINARY PROBLEMS  *BOWEL PROBLEMS  UNUSUAL RASH Items with * indicate a potential emergency and should be followed up as soon as possible.  Feel free to call the clinic should you have any questions or concerns. The clinic phone number is [phone].  Please show the CHEMO ALERT CARD at check-in to the Emergency Department and triage nurse.   

## 2018-05-26 NOTE — Progress Notes (Signed)
[facility] Telephone:(336) [phone]   Fax:(336) [phone]  OFFICE PROGRESS NOTE  [name], MD [address] [place] [address]  DIAGNOSIS: stageIIIB (T3, N2, M0)non-small cell lung cancer, adenocarcinoma diagnosed in March 2019 and presented with large right upper lobe lung mass in addition to right hilar and mediastinal lymphadenopathy.  PD-L1 0%  PRIOR THERAPY: Concurrent chemoradiation with weekly carboplatin for an AUC of 2 and paclitaxel 45 mg/m2.  First dose expected on 06/01/2017.  Status post 7 cycles.  Last dose was giving 07/14/2017.  CURRENT THERAPY:  Consolidation treatment with immunotherapy with Imfinzi (Durvalumab) 10 mg/KG every 2 weeks status post 19 cycles..  INTERVAL HISTORY: Ashley Wilson 63 y.o. female returns to the clinic today for follow-up visit.  The patient is feeling fine today with no concerning complaints.  She continues to tolerate her treatment with Imfinzi fairly well.  She denied having any current chest pain, shortness of breath, cough or hemoptysis.  She denied having any fever or chills.  She has no nausea, vomiting, diarrhea or constipation.  She denied having any headache or visual changes.  The patient is here today for evaluation before starting cycle #20 of her treatment.   MEDICAL HISTORY: Past Medical History:  Diagnosis Date   Compression fracture of lumbar vertebra ([place])    Hypertension    NSCL ca dx'd 05/2017    ALLERGIES:  is allergic to lisinopril-hydrochlorothiazide.  MEDICATIONS:  Current Outpatient Medications  Medication Sig Dispense Refill   acetaminophen (TYLENOL) 500 MG tablet Take 1,000 mg by mouth every 6 (six) hours as needed for mild pain or moderate pain.     amLODipine (NORVASC) 10 MG tablet Take 10 mg by mouth daily.     clotrimazole-betamethasone (LOTRISONE) cream Apply 1 application topically 2 (two) times daily. 45 g 1   diphenhydrAMINE (BENADRYL) 25 MG tablet Take 1 tablet  (25 mg total) by mouth every 4 (four) hours as needed for itching or allergies. 20 tablet 0   hydrochlorothiazide (HYDRODIURIL) 25 MG tablet Take 1 tablet (25 mg total) by mouth daily. 30 tablet 0   levothyroxine (SYNTHROID, LEVOTHROID) 50 MCG tablet TAKE 1 TABLET(50 MCG) BY MOUTH DAILY BEFORE BREAKFAST (Patient taking differently: Take 50 mcg by mouth daily before breakfast. ) 30 tablet 1   loperamide (IMODIUM) 2 MG capsule Take 4 mg by mouth as needed for diarrhea or loose stools.     predniSONE (DELTASONE) 20 MG tablet 3 tabs po daily x 4 days STARTING 03/06/18 12 tablet 0   prochlorperazine (COMPAZINE) 5 MG tablet Take 1 tablet by mouth every 6 (six) hours as needed.  0   ranitidine (ZANTAC) 150 MG tablet Take 1 tablet (150 mg total) by mouth 2 (two) times daily. STARTING TONIGHT and taking x4 days after 10 tablet 0   rosuvastatin (CRESTOR) 20 MG tablet Take 20 mg by mouth daily.     spironolactone (ALDACTONE) 25 MG tablet Take 25 mg by mouth daily.     No current facility-administered medications for this visit.     SURGICAL HISTORY:  Past Surgical History:  Procedure Laterality Date   BACK SURGERY  2009   ruptured disc    REVIEW OF SYSTEMS:  A comprehensive review of systems was negative.   PHYSICAL EXAMINATION: General appearance: alert, cooperative and no distress Head: Normocephalic, without obvious abnormality, atraumatic Neck: no adenopathy, no JVD, supple, symmetrical, trachea midline and thyroid not enlarged, symmetric, no tenderness/mass/nodules Lymph nodes:  Cervical, supraclavicular, and axillary nodes normal. Resp: clear to auscultation bilaterally Back: symmetric, no curvature. ROM normal. No CVA tenderness. Cardio: regular rate and rhythm, S1, S2 normal, no murmur, click, rub or gallop GI: soft, non-tender; bowel sounds normal; no masses,  no organomegaly Extremities: extremities normal, atraumatic, no cyanosis or edema  ECOG PERFORMANCE STATUS: 0 -  Asymptomatic  Blood pressure 115/65, pulse 82, temperature 98.5 F (36.9 C), temperature source Oral, resp. rate 18, height 5' 3"  (1.6 m), weight 194 lb 14.4 oz (88.4 kg), SpO2 100 %.  LABORATORY DATA: Lab Results  Component Value Date   WBC 6.2 05/26/2018   HGB 12.3 05/26/2018   HCT 39.5 05/26/2018   MCV 88.0 05/26/2018   PLT 262 05/26/2018      Chemistry      Component Value Date/Time   NA 139 05/12/2018 0841   K 4.5 05/12/2018 0841   CL 106 05/12/2018 0841   CO2 22 05/12/2018 0841   BUN 21 05/12/2018 0841   CREATININE 1.15 (H) 05/12/2018 0841      Component Value Date/Time   CALCIUM 9.3 05/12/2018 0841   ALKPHOS 114 05/12/2018 0841   AST 16 05/12/2018 0841   ALT 15 05/12/2018 0841   BILITOT 0.3 05/12/2018 0841       RADIOGRAPHIC STUDIES: Ct Chest W Contrast  Result Date: 05/10/2018 CLINICAL DATA:  63 year old female with history of lung cancer diagnosed in 2019 status post chemotherapy and radiation therapy now complete. No current chest complaints. EXAM: CT CHEST WITH CONTRAST TECHNIQUE: Multidetector CT imaging of the chest was performed during intravenous contrast administration. CONTRAST:  56m OMNIPAQUE IOHEXOL 300 MG/ML  SOLN COMPARISON:  Chest CT 02/15/2018. FINDINGS: Cardiovascular: Heart size is normal. There is no significant pericardial fluid, thickening or pericardial calcification. There is aortic atherosclerosis, as well as atherosclerosis of the great vessels of the mediastinum and the coronary arteries, including calcified atherosclerotic plaque in the left main, left anterior descending and right coronary arteries. Mediastinum/Nodes: No pathologically enlarged mediastinal or hilar lymph nodes. Esophagus is unremarkable in appearance. No axillary lymphadenopathy. Lungs/Pleura: The previously treated mass in the lateral aspect of the right upper lobe near the apex appears similar to the prior study, currently measuring 3.7 x 5.0 cm (axial image 34 of series 2)  as compared with 3.8 x 4.9 cm on the prior examination. There are some surrounding areas of septal thickening and scattered areas of patchy ground-glass attenuation in adjacent portions of the upper right lung, most compatible with evolving postradiation changes. No definite new suspicious appearing pulmonary nodules or masses are noted. No acute consolidative airspace disease. No pleural effusions. Upper Abdomen: Incompletely imaged exophytic low-attenuation lesion in the anterior aspect of the upper pole of the left kidney measuring approximately 6.8 cm, similar to the prior study, not characterized but strongly favored to represent an exophytic cyst. Musculoskeletal: There are no aggressive appearing lytic or blastic lesions noted in the visualized portions of the skeleton. IMPRESSION: 1. Stable size and appearance of treated mass in the right upper lobe near the apex. The stability of this lesion is reassuring, however, close attention on follow-up studies is recommended 2. No hilar or mediastinal lymphadenopathy noted at this time. 3. Aortic atherosclerosis, in addition to left main and 2 vessel coronary artery disease. Please note that although the presence of coronary artery calcium documents the presence of coronary artery disease, the severity of this disease and any potential stenosis cannot be assessed on this non-gated CT examination. Assessment for potential  risk factor modification, dietary therapy or pharmacologic therapy may be warranted, if clinically indicated. Aortic Atherosclerosis (ICD10-I70.0). Electronically Signed   By: Vinnie Langton M.D.   On: 05/10/2018 08:08    ASSESSMENT AND PLAN: This is a very pleasant 63 years old [name] with stage IIIB non-small cell lung cancer, adenocarcinoma.   The patient underwent a course of concurrent chemoradiation with weekly carboplatin and paclitaxel status post 7 cycles.  She tolerated this course of treatment fairly well with no  concerning complaints.   The patient is currently undergoing treatment with consolidation immunotherapy with Imfinzi (Durvalumab) 10 mg/KG every 2 weeks status post 19 cycles. The patient continues to tolerate this treatment well with no concerning adverse effects. I recommended for her to proceed with cycle #20 today as scheduled. I will see her back for follow-up visit in 2 weeks for evaluation before the next cycle of her treatment. She was advised to call immediately if she has any concerning symptoms in the interval. The patient voices understanding of current disease status and treatment options and is in agreement with the current care plan. All questions were answered. The patient knows to call the clinic with any problems, questions or concerns. We can certainly see the patient much sooner if necessary.  Disclaimer: This note was dictated with voice recognition software. Similar sounding words can inadvertently be transcribed and may not be corrected upon review.

## 2018-05-28 ENCOUNTER — Telehealth: Payer: Self-pay | Admitting: Internal Medicine

## 2018-05-28 NOTE — Telephone Encounter (Signed)
Rescheuled appt per MD request, moved 4/22 to 4/23.

## 2018-06-07 ENCOUNTER — Telehealth: Payer: Self-pay | Admitting: Internal Medicine

## 2018-06-07 NOTE — Telephone Encounter (Signed)
Moved 4/23 appointments from afternoon to morning. Spoke with patient.

## 2018-06-09 ENCOUNTER — Ambulatory Visit: Admitting: Internal Medicine

## 2018-06-09 ENCOUNTER — Ambulatory Visit

## 2018-06-09 ENCOUNTER — Other Ambulatory Visit

## 2018-06-10 ENCOUNTER — Inpatient Hospital Stay

## 2018-06-10 ENCOUNTER — Other Ambulatory Visit: Payer: Self-pay

## 2018-06-10 ENCOUNTER — Inpatient Hospital Stay (HOSPITAL_BASED_OUTPATIENT_CLINIC_OR_DEPARTMENT_OTHER): Admitting: Internal Medicine

## 2018-06-10 ENCOUNTER — Encounter: Payer: Self-pay | Admitting: Internal Medicine

## 2018-06-10 VITALS — BP 119/61 | HR 77 | Temp 98.7°F | Resp 18 | Ht 63.0 in | Wt 198.2 lb

## 2018-06-10 DIAGNOSIS — C3411 Malignant neoplasm of upper lobe, right bronchus or lung: Secondary | ICD-10-CM

## 2018-06-10 DIAGNOSIS — Z5112 Encounter for antineoplastic immunotherapy: Secondary | ICD-10-CM | POA: Diagnosis not present

## 2018-06-10 DIAGNOSIS — I10 Essential (primary) hypertension: Secondary | ICD-10-CM

## 2018-06-10 DIAGNOSIS — Z79899 Other long term (current) drug therapy: Secondary | ICD-10-CM | POA: Diagnosis not present

## 2018-06-10 LAB — CMP (CANCER CENTER ONLY)
ALT: 11 U/L (ref 0–44)
AST: 13 U/L — ABNORMAL LOW (ref 15–41)
Albumin: 3.7 g/dL (ref 3.5–5.0)
Alkaline Phosphatase: 129 U/L — ABNORMAL HIGH (ref 38–126)
Anion gap: 13 (ref 5–15)
BUN: 27 mg/dL — ABNORMAL HIGH (ref 8–23)
CO2: 20 mmol/L — ABNORMAL LOW (ref 22–32)
Calcium: 9.8 mg/dL (ref 8.9–10.3)
Chloride: 109 mmol/L (ref 98–111)
Creatinine: 1.12 mg/dL — ABNORMAL HIGH (ref 0.44–1.00)
GFR, Est AFR Am: >60 mL/min (ref >60)
GFR, Estimated: 52 mL/min — ABNORMAL LOW (ref >60)
Glucose, Bld: 109 mg/dL — ABNORMAL HIGH (ref 70–99)
Potassium: 4.1 mmol/L (ref 3.5–5.1)
Sodium: 142 mmol/L (ref 135–145)
Total Bilirubin: 0.3 mg/dL (ref 0.3–1.2)
Total Protein: 8.1 g/dL (ref 6.5–8.1)

## 2018-06-10 LAB — CBC WITH DIFFERENTIAL (CANCER CENTER ONLY)
Abs Immature Granulocytes: 0.05 10*3/uL (ref 0.00–0.07)
Basophils Absolute: 0.1 10*3/uL (ref 0.0–0.1)
Basophils Relative: 1 %
Eosinophils Absolute: 0.2 10*3/uL (ref 0.0–0.5)
Eosinophils Relative: 3 %
HCT: 39.6 % (ref 36.0–46.0)
Hemoglobin: 12.5 g/dL (ref 12.0–15.0)
Immature Granulocytes: 1 %
Lymphocytes Relative: 16 %
Lymphs Abs: 1.1 10*3/uL (ref 0.7–4.0)
MCH: 27.2 pg (ref 26.0–34.0)
MCHC: 31.6 g/dL (ref 30.0–36.0)
MCV: 86.3 fL (ref 80.0–100.0)
Monocytes Absolute: 0.6 10*3/uL (ref 0.1–1.0)
Monocytes Relative: 9 %
Neutro Abs: 5.1 10*3/uL (ref 1.7–7.7)
Neutrophils Relative %: 70 %
Platelet Count: 255 10*3/uL (ref 150–400)
RBC: 4.59 MIL/uL (ref 3.87–5.11)
RDW: 14.6 % (ref 11.5–15.5)
WBC Count: 7.1 10*3/uL (ref 4.0–10.5)
nRBC: 0 % (ref 0.0–0.2)

## 2018-06-10 LAB — TSH: TSH: 3.842 u[IU]/mL (ref 0.308–3.960)

## 2018-06-10 MED ORDER — SODIUM CHLORIDE 0.9 % IV SOLN
Freq: Once | INTRAVENOUS | Status: AC
  Administered 2018-06-10: 11:00:00 via INTRAVENOUS
  Filled 2018-06-10: qty 250

## 2018-06-10 MED ORDER — SODIUM CHLORIDE 0.9 % IV SOLN
10.0000 mg/kg | Freq: Once | INTRAVENOUS | Status: AC
  Administered 2018-06-10: 860 mg via INTRAVENOUS
  Filled 2018-06-10: qty 10

## 2018-06-10 NOTE — Progress Notes (Signed)
[place] [phone]   Fax:(336) [phone]  OFFICE PROGRESS NOTE  [name], MD [address] [place] [address]  DIAGNOSIS: stageIIIB (T3, N2, M0)non-small cell lung cancer, adenocarcinoma diagnosed in March 2019 and presented with large right upper lobe lung mass in addition to right hilar and mediastinal lymphadenopathy.  PD-L1 0%  PRIOR THERAPY: Concurrent chemoradiation with weekly carboplatin for an AUC of 2 and paclitaxel 45 mg/m2.  First dose expected on 06/01/2017.  Status post 7 cycles.  Last dose was giving 07/14/2017.  CURRENT THERAPY:  Consolidation treatment with immunotherapy with Imfinzi (Durvalumab) 10 mg/KG every 2 weeks status post 20 cycles..  INTERVAL HISTORY: Ashley Wilson 63 y.o. female returns to the clinic today for follow-up visit.  The patient is feeling fine today with no concerning complaints.  She denied having any chest pain, shortness of breath, cough or hemoptysis.  She has no recent weight loss or night sweats.  She denied having any nausea, vomiting, diarrhea or constipation.  She has no fever or chills.  She continues to tolerate her treatment with Imfinzi fairly well.  She is here for evaluation before starting cycle #21.  MEDICAL HISTORY: Past Medical History:  Diagnosis Date  . Compression fracture of lumbar vertebra ([place])   . Hypertension   . NSCL ca dx'd 05/2017    ALLERGIES:  is allergic to lisinopril-hydrochlorothiazide.  MEDICATIONS:  Current Outpatient Medications  Medication Sig Dispense Refill  . acetaminophen (TYLENOL) 500 MG tablet Take 1,000 mg by mouth every 6 (six) hours as needed for mild pain or moderate pain.    Marland Kitchen amLODipine (NORVASC) 10 MG tablet Take 10 mg by mouth daily.    . clotrimazole-betamethasone (LOTRISONE) cream Apply 1 application topically 2 (two) times daily. 45 g 1  . diphenhydrAMINE (BENADRYL) 25 MG tablet Take 1 tablet (25 mg total) by mouth every 4 (four) hours as  needed for itching or allergies. 20 tablet 0  . hydrochlorothiazide (HYDRODIURIL) 25 MG tablet Take 1 tablet (25 mg total) by mouth daily. 30 tablet 0  . levothyroxine (SYNTHROID, LEVOTHROID) 50 MCG tablet TAKE 1 TABLET(50 MCG) BY MOUTH DAILY BEFORE BREAKFAST (Patient taking differently: Take 50 mcg by mouth daily before breakfast. ) 30 tablet 1  . loperamide (IMODIUM) 2 MG capsule Take 4 mg by mouth as needed for diarrhea or loose stools.    . predniSONE (DELTASONE) 20 MG tablet 3 tabs po daily x 4 days STARTING 03/06/18 12 tablet 0  . prochlorperazine (COMPAZINE) 5 MG tablet Take 1 tablet by mouth every 6 (six) hours as needed.  0  . ranitidine (ZANTAC) 150 MG tablet Take 1 tablet (150 mg total) by mouth 2 (two) times daily. STARTING TONIGHT and taking x4 days after 10 tablet 0  . rosuvastatin (CRESTOR) 20 MG tablet Take 20 mg by mouth daily.    Marland Kitchen spironolactone (ALDACTONE) 25 MG tablet Take 25 mg by mouth daily.     No current facility-administered medications for this visit.     SURGICAL HISTORY:  Past Surgical History:  Procedure Laterality Date  . BACK SURGERY  2009   ruptured disc    REVIEW OF SYSTEMS:  A comprehensive review of systems was negative.   PHYSICAL EXAMINATION: General appearance: alert, cooperative and no distress Head: Normocephalic, without obvious abnormality, atraumatic Neck: no adenopathy, no JVD, supple, symmetrical, trachea midline and thyroid not enlarged, symmetric, no tenderness/mass/nodules Lymph nodes: Cervical, supraclavicular, and axillary nodes normal.  Resp: clear to auscultation bilaterally Back: symmetric, no curvature. ROM normal. No CVA tenderness. Cardio: regular rate and rhythm, S1, S2 normal, no murmur, click, rub or gallop GI: soft, non-tender; bowel sounds normal; no masses,  no organomegaly Extremities: extremities normal, atraumatic, no cyanosis or edema  ECOG PERFORMANCE STATUS: 0 - Asymptomatic  Blood pressure 119/61, pulse 77,  temperature 98.7 F (37.1 C), temperature source Oral, resp. rate 18, height 5' 3"  (1.6 m), weight 198 lb 3.2 oz (89.9 kg), SpO2 100 %.  LABORATORY DATA: Lab Results  Component Value Date   WBC 7.1 06/10/2018   HGB 12.5 06/10/2018   HCT 39.6 06/10/2018   MCV 86.3 06/10/2018   PLT 255 06/10/2018      Chemistry      Component Value Date/Time   NA 140 05/26/2018 0922   K 4.5 05/26/2018 0922   CL 108 05/26/2018 0922   CO2 21 (L) 05/26/2018 0922   BUN 22 05/26/2018 0922   CREATININE 1.10 (H) 05/26/2018 0922      Component Value Date/Time   CALCIUM 9.4 05/26/2018 0922   ALKPHOS 121 05/26/2018 0922   AST 16 05/26/2018 0922   ALT 13 05/26/2018 0922   BILITOT 0.3 05/26/2018 0922       RADIOGRAPHIC STUDIES: No results found.  ASSESSMENT AND PLAN: This is a very pleasant 63 years old [name] with stage IIIB non-small cell lung cancer, adenocarcinoma.   The patient underwent a course of concurrent chemoradiation with weekly carboplatin and paclitaxel status post 7 cycles.  She tolerated this course of treatment fairly well with no concerning complaints.   The patient is currently undergoing treatment with consolidation immunotherapy with Imfinzi (Durvalumab) 10 mg/KG every 2 weeks status post 20 cycles. The patient continues to tolerate her treatment with Imfinzi well with no concerning adverse effects. I recommended for her to proceed with cycle #21 today as a schedule. I will see her back for follow-up visit in 2 weeks for evaluation before the next cycle of her treatment. She was advised to call immediately if she has any concerning symptoms in the interval. The patient voices understanding of current disease status and treatment options and is in agreement with the current care plan. All questions were answered. The patient knows to call the clinic with any problems, questions or concerns. We can certainly see the patient much sooner if necessary.  Disclaimer:  This note was dictated with voice recognition software. Similar sounding words can inadvertently be transcribed and may not be corrected upon review.

## 2018-06-10 NOTE — Patient Instructions (Signed)
[facility] Discharge Instructions for Patients Receiving Chemotherapy  Today you received the following chemotherapy agents: Imfinzi.  To help prevent nausea and vomiting after your treatment, we encourage you to take your nausea medication as directed.   If you develop nausea and vomiting that is not controlled by your nausea medication, call the clinic.   BELOW ARE SYMPTOMS THAT SHOULD BE REPORTED IMMEDIATELY:  *FEVER GREATER THAN 100.5 F  *CHILLS WITH OR WITHOUT FEVER  NAUSEA AND VOMITING THAT IS NOT CONTROLLED WITH YOUR NAUSEA MEDICATION  *UNUSUAL SHORTNESS OF BREATH  *UNUSUAL BRUISING OR BLEEDING  TENDERNESS IN MOUTH AND THROAT WITH OR WITHOUT PRESENCE OF ULCERS  *URINARY PROBLEMS  *BOWEL PROBLEMS  UNUSUAL RASH Items with * indicate a potential emergency and should be followed up as soon as possible.  Feel free to call the clinic should you have any questions or concerns. The clinic phone number is [phone].  Please show the CHEMO ALERT CARD at check-in to the Emergency Department and triage nurse.   

## 2018-06-11 ENCOUNTER — Telehealth: Payer: Self-pay | Admitting: Internal Medicine

## 2018-06-11 NOTE — Telephone Encounter (Signed)
Tried to reach regarding schedule °

## 2018-06-23 ENCOUNTER — Encounter: Payer: Self-pay | Admitting: Internal Medicine

## 2018-06-23 ENCOUNTER — Telehealth: Payer: Self-pay | Admitting: Internal Medicine

## 2018-06-23 ENCOUNTER — Inpatient Hospital Stay

## 2018-06-23 ENCOUNTER — Inpatient Hospital Stay: Attending: Internal Medicine

## 2018-06-23 ENCOUNTER — Inpatient Hospital Stay (HOSPITAL_BASED_OUTPATIENT_CLINIC_OR_DEPARTMENT_OTHER): Admitting: Internal Medicine

## 2018-06-23 ENCOUNTER — Other Ambulatory Visit: Payer: Self-pay

## 2018-06-23 VITALS — BP 129/69 | HR 84 | Temp 98.0°F | Resp 18 | Ht 63.0 in | Wt 196.3 lb

## 2018-06-23 DIAGNOSIS — Z7952 Long term (current) use of systemic steroids: Secondary | ICD-10-CM | POA: Insufficient documentation

## 2018-06-23 DIAGNOSIS — I2510 Atherosclerotic heart disease of native coronary artery without angina pectoris: Secondary | ICD-10-CM | POA: Insufficient documentation

## 2018-06-23 DIAGNOSIS — C3411 Malignant neoplasm of upper lobe, right bronchus or lung: Secondary | ICD-10-CM

## 2018-06-23 DIAGNOSIS — I700 Atherosclerosis of aorta: Secondary | ICD-10-CM | POA: Insufficient documentation

## 2018-06-23 DIAGNOSIS — Z923 Personal history of irradiation: Secondary | ICD-10-CM | POA: Insufficient documentation

## 2018-06-23 DIAGNOSIS — Z5112 Encounter for antineoplastic immunotherapy: Secondary | ICD-10-CM

## 2018-06-23 DIAGNOSIS — I10 Essential (primary) hypertension: Secondary | ICD-10-CM | POA: Diagnosis not present

## 2018-06-23 DIAGNOSIS — Z79899 Other long term (current) drug therapy: Secondary | ICD-10-CM

## 2018-06-23 DIAGNOSIS — Z9221 Personal history of antineoplastic chemotherapy: Secondary | ICD-10-CM | POA: Diagnosis not present

## 2018-06-23 LAB — CMP (CANCER CENTER ONLY)
ALT: 13 U/L (ref 0–44)
AST: 15 U/L (ref 15–41)
Albumin: 3.7 g/dL (ref 3.5–5.0)
Alkaline Phosphatase: 122 U/L (ref 38–126)
Anion gap: 13 (ref 5–15)
BUN: 28 mg/dL — ABNORMAL HIGH (ref 8–23)
CO2: 21 mmol/L — ABNORMAL LOW (ref 22–32)
Calcium: 9.6 mg/dL (ref 8.9–10.3)
Chloride: 108 mmol/L (ref 98–111)
Creatinine: 1.21 mg/dL — ABNORMAL HIGH (ref 0.44–1.00)
GFR, Est AFR Am: 55 mL/min — ABNORMAL LOW (ref >60)
GFR, Estimated: 47 mL/min — ABNORMAL LOW (ref >60)
Glucose, Bld: 99 mg/dL (ref 70–99)
Potassium: 4.2 mmol/L (ref 3.5–5.1)
Sodium: 142 mmol/L (ref 135–145)
Total Bilirubin: 0.3 mg/dL (ref 0.3–1.2)
Total Protein: 8.1 g/dL (ref 6.5–8.1)

## 2018-06-23 LAB — CBC WITH DIFFERENTIAL (CANCER CENTER ONLY)
Abs Immature Granulocytes: 0.05 10*3/uL (ref 0.00–0.07)
Basophils Absolute: 0.1 10*3/uL (ref 0.0–0.1)
Basophils Relative: 1 %
Eosinophils Absolute: 0.1 10*3/uL (ref 0.0–0.5)
Eosinophils Relative: 2 %
HCT: 38.4 % (ref 36.0–46.0)
Hemoglobin: 12.1 g/dL (ref 12.0–15.0)
Immature Granulocytes: 1 %
Lymphocytes Relative: 13 %
Lymphs Abs: 1 10*3/uL (ref 0.7–4.0)
MCH: 27.4 pg (ref 26.0–34.0)
MCHC: 31.5 g/dL (ref 30.0–36.0)
MCV: 87.1 fL (ref 80.0–100.0)
Monocytes Absolute: 0.8 10*3/uL (ref 0.1–1.0)
Monocytes Relative: 10 %
Neutro Abs: 5.7 10*3/uL (ref 1.7–7.7)
Neutrophils Relative %: 73 %
Platelet Count: 265 10*3/uL (ref 150–400)
RBC: 4.41 MIL/uL (ref 3.87–5.11)
RDW: 14.8 % (ref 11.5–15.5)
WBC Count: 7.7 10*3/uL (ref 4.0–10.5)
nRBC: 0 % (ref 0.0–0.2)

## 2018-06-23 MED ORDER — SODIUM CHLORIDE 0.9 % IV SOLN
Freq: Once | INTRAVENOUS | Status: AC
  Administered 2018-06-23: 13:00:00 via INTRAVENOUS
  Filled 2018-06-23: qty 250

## 2018-06-23 MED ORDER — SODIUM CHLORIDE 0.9 % IV SOLN
10.0000 mg/kg | Freq: Once | INTRAVENOUS | Status: AC
  Administered 2018-06-23: 860 mg via INTRAVENOUS
  Filled 2018-06-23: qty 10

## 2018-06-23 NOTE — Patient Instructions (Signed)
[facility] Discharge Instructions for Patients Receiving Chemotherapy  Today you received the following chemotherapy agents: Imfinzi  To help prevent nausea and vomiting after your treatment, we encourage you to take your nausea medication as directed.   If you develop nausea and vomiting that is not controlled by your nausea medication, call the clinic.   BELOW ARE SYMPTOMS THAT SHOULD BE REPORTED IMMEDIATELY:  *FEVER GREATER THAN 100.5 F  *CHILLS WITH OR WITHOUT FEVER  NAUSEA AND VOMITING THAT IS NOT CONTROLLED WITH YOUR NAUSEA MEDICATION  *UNUSUAL SHORTNESS OF BREATH  *UNUSUAL BRUISING OR BLEEDING  TENDERNESS IN MOUTH AND THROAT WITH OR WITHOUT PRESENCE OF ULCERS  *URINARY PROBLEMS  *BOWEL PROBLEMS  UNUSUAL RASH Items with * indicate a potential emergency and should be followed up as soon as possible.  Feel free to call the clinic should you have any questions or concerns. The clinic phone number is [phone].  Please show the [place] at check-in to the Emergency Department and triage nurse.  Coronavirus (COVID-19) Are you at risk?  Are you at risk for the Coronavirus (COVID-19)?  To be considered HIGH RISK for Coronavirus (COVID-19), you have to meet the following criteria:  . Traveled to [place], [place], [place], [place] or [place]; or in the [place] to [place], [place], [place], or [place]; and have fever, cough, and shortness of breath within the last 2 weeks of travel OR . Been in close contact with a person diagnosed with COVID-19 within the last 2 weeks and have fever, cough, and shortness of breath . IF YOU DO NOT MEET THESE CRITERIA, YOU ARE CONSIDERED LOW RISK FOR COVID-19.  What to do if you are HIGH RISK for COVID-19?  Marland Kitchen If you are having a medical emergency, call 911. . Seek medical care right away. Before you go to a doctor's office, urgent care or emergency department, call ahead and tell them about your recent  travel, contact with someone diagnosed with COVID-19, and your symptoms. You should receive instructions from your physician's office regarding next steps of care.  . When you arrive at healthcare provider, tell the healthcare staff immediately you have returned from visiting [place], [place], [place], [place] or [place]; or traveled in the [place] to [place], [place], [place], or [place]; in the last two weeks or you have been in close contact with a person diagnosed with COVID-19 in the last 2 weeks.   . Tell the health care staff about your symptoms: fever, cough and shortness of breath. . After you have been seen by a medical provider, you will be either: o Tested for (COVID-19) and discharged home on quarantine except to seek medical care if symptoms worsen, and asked to  - Stay home and avoid contact with others until you get your results (4-5 days)  - Avoid travel on public transportation if possible (such as bus, train, or airplane) or o Sent to the Emergency Department by EMS for evaluation, COVID-19 testing, and possible admission depending on your condition and test results.  What to do if you are LOW RISK for COVID-19?  Reduce your risk of any infection by using the same precautions used for avoiding the common cold or flu:  Marland Kitchen Wash your hands often with soap and warm water for at least 20 seconds.  If soap and water are not readily available, use an alcohol-based hand sanitizer with at least 60% alcohol.  . If coughing or sneezing,  cover your mouth and nose by coughing or sneezing into the elbow areas of your shirt or coat, into a tissue or into your sleeve (not your hands). . Avoid shaking hands with others and consider head nods or verbal greetings only. . Avoid touching your eyes, nose, or mouth with unwashed hands.  . Avoid close contact with people who are sick. . Avoid places or events with large numbers of people in one location, like concerts or sporting  events. . Carefully consider travel plans you have or are making. . If you are planning any travel outside or inside the [place], visit the CDC's Travelers' Health webpage for the latest health notices. . If you have some symptoms but not all symptoms, continue to monitor at home and seek medical attention if your symptoms worsen. . If you are having a medical emergency, call 911.   [place] / e-Visit: [email or web address]         [facility]: [facility]: [phone]                   [facility]: [phone]

## 2018-06-23 NOTE — Progress Notes (Signed)
[place] Telephone:(336) [phone]   Fax:(336) [phone]  OFFICE PROGRESS NOTE  [name], MD [address] [place] [address]  DIAGNOSIS: stageIIIB (T3, N2, M0)non-small cell lung cancer, adenocarcinoma diagnosed in March 2019 and presented with large right upper lobe lung mass in addition to right hilar and mediastinal lymphadenopathy.  PD-L1 0%  PRIOR THERAPY: Concurrent chemoradiation with weekly carboplatin for an AUC of 2 and paclitaxel 45 mg/m2.  First dose expected on 06/01/2017.  Status post 7 cycles.  Last dose was giving 07/14/2017.  CURRENT THERAPY:  Consolidation treatment with immunotherapy with Imfinzi (Durvalumab) 10 mg/KG every 2 weeks status post 21 cycles..  INTERVAL HISTORY: Ashley Wilson 63 y.o. female returns to the clinic today for follow-up visit.  The patient is feeling fine today with no concerning complaints.  She denied having any fever or chills.  She has no nausea, vomiting, diarrhea or constipation.  She denied having any headache or visual changes.  She has no weight loss or night sweats.  She continues to tolerate her treatment with Imfinzi fairly well.  The patient is here today for evaluation before starting cycle #22.  MEDICAL HISTORY: Past Medical History:  Diagnosis Date   Compression fracture of lumbar vertebra ([place])    Hypertension    NSCL ca dx'd 05/2017    ALLERGIES:  is allergic to lisinopril-hydrochlorothiazide.  MEDICATIONS:  Current Outpatient Medications  Medication Sig Dispense Refill   acetaminophen (TYLENOL) 500 MG tablet Take 1,000 mg by mouth every 6 (six) hours as needed for mild pain or moderate pain.     amLODipine (NORVASC) 10 MG tablet Take 10 mg by mouth daily.     clotrimazole-betamethasone (LOTRISONE) cream Apply 1 application topically 2 (two) times daily. 45 g 1   diphenhydrAMINE (BENADRYL) 25 MG tablet Take 1 tablet (25 mg total) by mouth every 4 (four) hours as needed for itching  or allergies. 20 tablet 0   hydrochlorothiazide (HYDRODIURIL) 25 MG tablet Take 1 tablet (25 mg total) by mouth daily. 30 tablet 0   levothyroxine (SYNTHROID, LEVOTHROID) 50 MCG tablet TAKE 1 TABLET(50 MCG) BY MOUTH DAILY BEFORE BREAKFAST (Patient taking differently: Take 50 mcg by mouth daily before breakfast. ) 30 tablet 1   loperamide (IMODIUM) 2 MG capsule Take 4 mg by mouth as needed for diarrhea or loose stools.     prochlorperazine (COMPAZINE) 5 MG tablet Take 1 tablet by mouth every 6 (six) hours as needed.  0   ranitidine (ZANTAC) 150 MG tablet Take 1 tablet (150 mg total) by mouth 2 (two) times daily. STARTING TONIGHT and taking x4 days after 10 tablet 0   rosuvastatin (CRESTOR) 20 MG tablet Take 20 mg by mouth daily.     spironolactone (ALDACTONE) 25 MG tablet Take 25 mg by mouth daily.     No current facility-administered medications for this visit.     SURGICAL HISTORY:  Past Surgical History:  Procedure Laterality Date   BACK SURGERY  2009   ruptured disc    REVIEW OF SYSTEMS:  A comprehensive review of systems was negative.   PHYSICAL EXAMINATION: General appearance: alert, cooperative and no distress Head: Normocephalic, without obvious abnormality, atraumatic Neck: no adenopathy, no JVD, supple, symmetrical, trachea midline and thyroid not enlarged, symmetric, no tenderness/mass/nodules Lymph nodes: Cervical, supraclavicular, and axillary nodes normal. Resp: clear to auscultation bilaterally Back: symmetric, no curvature. ROM normal. No CVA tenderness. Cardio: regular rate and rhythm, S1, S2 normal,  no murmur, click, rub or gallop GI: soft, non-tender; bowel sounds normal; no masses,  no organomegaly Extremities: extremities normal, atraumatic, no cyanosis or edema  ECOG PERFORMANCE STATUS: 0 - Asymptomatic  Blood pressure 129/69, pulse 84, temperature 98 F (36.7 C), temperature source Oral, resp. rate 18, height 5' 3"  (1.6 m), weight 196 lb 4.8 oz (89 kg),  SpO2 99 %.  LABORATORY DATA: Lab Results  Component Value Date   WBC 7.7 06/23/2018   HGB 12.1 06/23/2018   HCT 38.4 06/23/2018   MCV 87.1 06/23/2018   PLT 265 06/23/2018      Chemistry      Component Value Date/Time   NA 142 06/10/2018 0851   K 4.1 06/10/2018 0851   CL 109 06/10/2018 0851   CO2 20 (L) 06/10/2018 0851   BUN 27 (H) 06/10/2018 0851   CREATININE 1.12 (H) 06/10/2018 0851      Component Value Date/Time   CALCIUM 9.8 06/10/2018 0851   ALKPHOS 129 (H) 06/10/2018 0851   AST 13 (L) 06/10/2018 0851   ALT 11 06/10/2018 0851   BILITOT 0.3 06/10/2018 0851       RADIOGRAPHIC STUDIES: No results found.  ASSESSMENT AND PLAN: This is a very pleasant 63 years old [name] with stage IIIB non-small cell lung cancer, adenocarcinoma.   The patient underwent a course of concurrent chemoradiation with weekly carboplatin and paclitaxel status post 7 cycles.  She tolerated this course of treatment fairly well with no concerning complaints.   The patient is currently undergoing treatment with consolidation immunotherapy with Imfinzi (Durvalumab) 10 mg/KG every 2 weeks status post 21 cycles. The patient continues to tolerate her treatment well with no concerning adverse effects. I will see her back for follow-up visit in 2 weeks for evaluation before the next cycle of her treatment. She was advised to call immediately if she has any concerning symptoms in the interval. The patient voices understanding of current disease status and treatment options and is in agreement with the current care plan. All questions were answered. The patient knows to call the clinic with any problems, questions or concerns. We can certainly see the patient much sooner if necessary.  Disclaimer: This note was dictated with voice recognition software. Similar sounding words can inadvertently be transcribed and may not be corrected upon review.

## 2018-06-23 NOTE — Telephone Encounter (Signed)
Added additional cycles per 5/6 los - pt to get an updated schedule next visit.

## 2018-07-08 ENCOUNTER — Inpatient Hospital Stay

## 2018-07-08 ENCOUNTER — Inpatient Hospital Stay (HOSPITAL_BASED_OUTPATIENT_CLINIC_OR_DEPARTMENT_OTHER): Admitting: Physician Assistant

## 2018-07-08 ENCOUNTER — Other Ambulatory Visit: Payer: Self-pay

## 2018-07-08 ENCOUNTER — Encounter: Payer: Self-pay | Admitting: Physician Assistant

## 2018-07-08 VITALS — BP 133/67 | HR 86 | Temp 98.6°F | Resp 18 | Ht 63.0 in | Wt 200.3 lb

## 2018-07-08 DIAGNOSIS — I10 Essential (primary) hypertension: Secondary | ICD-10-CM | POA: Diagnosis not present

## 2018-07-08 DIAGNOSIS — C3411 Malignant neoplasm of upper lobe, right bronchus or lung: Secondary | ICD-10-CM

## 2018-07-08 DIAGNOSIS — Z9221 Personal history of antineoplastic chemotherapy: Secondary | ICD-10-CM | POA: Diagnosis not present

## 2018-07-08 DIAGNOSIS — I700 Atherosclerosis of aorta: Secondary | ICD-10-CM

## 2018-07-08 DIAGNOSIS — Z79899 Other long term (current) drug therapy: Secondary | ICD-10-CM

## 2018-07-08 DIAGNOSIS — Z5112 Encounter for antineoplastic immunotherapy: Secondary | ICD-10-CM | POA: Diagnosis not present

## 2018-07-08 DIAGNOSIS — Z923 Personal history of irradiation: Secondary | ICD-10-CM | POA: Diagnosis not present

## 2018-07-08 DIAGNOSIS — Z7952 Long term (current) use of systemic steroids: Secondary | ICD-10-CM

## 2018-07-08 DIAGNOSIS — I2510 Atherosclerotic heart disease of native coronary artery without angina pectoris: Secondary | ICD-10-CM

## 2018-07-08 LAB — CBC WITH DIFFERENTIAL (CANCER CENTER ONLY)
Abs Immature Granulocytes: 0.06 10*3/uL (ref 0.00–0.07)
Basophils Absolute: 0.1 10*3/uL (ref 0.0–0.1)
Basophils Relative: 1 %
Eosinophils Absolute: 0.2 10*3/uL (ref 0.0–0.5)
Eosinophils Relative: 2 %
HCT: 37.8 % (ref 36.0–46.0)
Hemoglobin: 11.9 g/dL — ABNORMAL LOW (ref 12.0–15.0)
Immature Granulocytes: 1 %
Lymphocytes Relative: 13 %
Lymphs Abs: 1.1 10*3/uL (ref 0.7–4.0)
MCH: 27.4 pg (ref 26.0–34.0)
MCHC: 31.5 g/dL (ref 30.0–36.0)
MCV: 87.1 fL (ref 80.0–100.0)
Monocytes Absolute: 0.7 10*3/uL (ref 0.1–1.0)
Monocytes Relative: 8 %
Neutro Abs: 6.5 10*3/uL (ref 1.7–7.7)
Neutrophils Relative %: 75 %
Platelet Count: 273 10*3/uL (ref 150–400)
RBC: 4.34 MIL/uL (ref 3.87–5.11)
RDW: 15 % (ref 11.5–15.5)
WBC Count: 8.6 10*3/uL (ref 4.0–10.5)
nRBC: 0 % (ref 0.0–0.2)

## 2018-07-08 LAB — CMP (CANCER CENTER ONLY)
ALT: 14 U/L (ref 0–44)
AST: 14 U/L — ABNORMAL LOW (ref 15–41)
Albumin: 3.7 g/dL (ref 3.5–5.0)
Alkaline Phosphatase: 116 U/L (ref 38–126)
Anion gap: 10 (ref 5–15)
BUN: 21 mg/dL (ref 8–23)
CO2: 21 mmol/L — ABNORMAL LOW (ref 22–32)
Calcium: 9.8 mg/dL (ref 8.9–10.3)
Chloride: 108 mmol/L (ref 98–111)
Creatinine: 1.1 mg/dL — ABNORMAL HIGH (ref 0.44–1.00)
GFR, Est AFR Am: >60 mL/min (ref >60)
GFR, Estimated: 53 mL/min — ABNORMAL LOW (ref >60)
Glucose, Bld: 113 mg/dL — ABNORMAL HIGH (ref 70–99)
Potassium: 4 mmol/L (ref 3.5–5.1)
Sodium: 139 mmol/L (ref 135–145)
Total Bilirubin: 0.3 mg/dL (ref 0.3–1.2)
Total Protein: 7.9 g/dL (ref 6.5–8.1)

## 2018-07-08 LAB — TSH: TSH: 4.385 u[IU]/mL — ABNORMAL HIGH (ref 0.308–3.960)

## 2018-07-08 MED ORDER — SODIUM CHLORIDE 0.9 % IV SOLN
Freq: Once | INTRAVENOUS | Status: AC
  Administered 2018-07-08: 10:00:00 via INTRAVENOUS
  Filled 2018-07-08: qty 250

## 2018-07-08 MED ORDER — SODIUM CHLORIDE 0.9 % IV SOLN
10.0000 mg/kg | Freq: Once | INTRAVENOUS | Status: AC
  Administered 2018-07-08: 860 mg via INTRAVENOUS
  Filled 2018-07-08: qty 10

## 2018-07-08 NOTE — Progress Notes (Signed)
[place] OFFICE PROGRESS NOTE  [name], [address] [place] [address]  DIAGNOSIS: StageIIIB (T3, N2, M0)non-small cell lung cancer, adenocarcinoma diagnosed in March 2019 and presented with large right upper lobe lung mass in addition to right hilar and mediastinal lymphadenopathy.  PD-L10%  PRIOR THERAPY: Concurrent chemoradiation with weekly carboplatin for an AUC of 2 and paclitaxel 45 mg/m2. First dose expected on 06/01/2017.  Status post 7 cycles.  Last dose was giving 07/14/2017  CURRENT THERAPY: Consolidation treatment with immunotherapy with Imfinzi (Durvalumab) 10 mg/KG every 2 weeks status post 22 cycles.  INTERVAL HISTORY: Ashley Wilson 63 y.o. female returns to the clinic for a follow up visit. The patient is feeling well today without any concerning complaints. She is tolerating her last treatment well without any adverse effects. She denies any fever, chills, night sweats, or weight loss.  Denies any chest pain, shortness of breath, cough, or hemoptysis.  She denies any headaches or visual changes.  She denies any nausea, vomiting, diarrhea, or constipation.  She denies any rashes or skin changes.  She is here today for evaluation before starting cycle #23 of her immunotherapy today as scheduled.  MEDICAL HISTORY: Past Medical History:  Diagnosis Date  . Compression fracture of lumbar vertebra ([place])   . Hypertension   . NSCL ca dx'd 05/2017    ALLERGIES:  is allergic to lisinopril-hydrochlorothiazide.  MEDICATIONS:  Current Outpatient Medications  Medication Sig Dispense Refill  . amLODipine (NORVASC) 10 MG tablet Take 10 mg by mouth daily.    . hydrochlorothiazide (HYDRODIURIL) 25 MG tablet Take 1 tablet (25 mg total) by mouth daily. 30 tablet 0  . rosuvastatin (CRESTOR) 20 MG tablet Take 20 mg by mouth daily.    Marland Kitchen spironolactone (ALDACTONE) 25 MG tablet Take 25 mg by mouth daily.    Marland Kitchen acetaminophen (TYLENOL) 500 MG tablet Take  1,000 mg by mouth every 6 (six) hours as needed for mild pain or moderate pain.    . clotrimazole-betamethasone (LOTRISONE) cream Apply 1 application topically 2 (two) times daily. (Patient not taking: Reported on 07/08/2018) 45 g 1  . prochlorperazine (COMPAZINE) 5 MG tablet Take 1 tablet by mouth every 6 (six) hours as needed.  0   No current facility-administered medications for this visit.    Facility-Administered Medications Ordered in Other Visits  Medication Dose Route Frequency Provider Last Rate Last Dose  . 0.9 %  sodium chloride infusion   Intravenous Once Curt Bears, MD      . durvalumab [facility]) 860 mg in sodium chloride 0.9 % 100 mL chemo infusion  10 mg/kg (Order-Specific) Intravenous Once Curt Bears, MD        SURGICAL HISTORY:  Past Surgical History:  Procedure Laterality Date  . BACK SURGERY  2009   ruptured disc    REVIEW OF SYSTEMS:   Review of Systems  Constitutional: Negative for appetite change, chills, fatigue, fever and unexpected weight change.  HENT:   Negative for mouth sores, nosebleeds, sore throat and trouble swallowing.   Eyes: Negative for eye problems and icterus.  Respiratory: Negative for cough, hemoptysis, shortness of breath and wheezing.   Cardiovascular: Negative for chest pain and leg swelling.  Gastrointestinal: Negative for abdominal pain, constipation, diarrhea, nausea and vomiting.  Genitourinary: Negative for bladder incontinence, difficulty urinating, dysuria, frequency and hematuria.   Musculoskeletal: Negative for back pain, gait problem, neck pain and neck stiffness.  Skin: Negative for itching and rash.  Neurological:  Negative for dizziness, extremity weakness, gait problem, headaches, light-headedness and seizures.  Hematological: Negative for adenopathy. Does not bruise/bleed easily.  Psychiatric/Behavioral: Negative for confusion, depression and sleep disturbance. The patient is not nervous/anxious.     PHYSICAL  EXAMINATION:  Blood pressure 133/67, pulse 86, temperature 98.6 F (37 C), temperature source Oral, resp. rate 18, height 5' 3"  (1.6 m), weight 200 lb 4.8 oz (90.9 kg), SpO2 98 %.  ECOG PERFORMANCE STATUS: 1 - Symptomatic but completely ambulatory  Physical Exam  Constitutional: Oriented to person, place, and time and well-developed, well-nourished, and in no distress.  HENT:  Head: Normocephalic and atraumatic.  Mouth/Throat: Oropharynx is clear and moist. No oropharyngeal exudate.  Eyes: Conjunctivae are normal. Right eye exhibits no discharge. Left eye exhibits no discharge. No scleral icterus.  Neck: Normal range of motion. Neck supple.  Cardiovascular: Normal rate, regular rhythm, normal heart sounds and intact distal pulses.   Pulmonary/Chest: Effort normal and breath sounds normal. No respiratory distress. No wheezes. No rales.  Abdominal: Soft. Bowel sounds are normal. Exhibits no distension and no mass. There is no tenderness.  Musculoskeletal: Normal range of motion. Exhibits no edema.  Lymphadenopathy:    No cervical adenopathy.  Neurological: Alert and oriented to person, place, and time. Exhibits normal muscle tone. Gait normal. Coordination normal.  Skin: Skin is warm and dry. No rash noted. Not diaphoretic. No erythema. No pallor.  Psychiatric: Mood, memory and judgment normal.  Vitals reviewed.  LABORATORY DATA: Lab Results  Component Value Date   WBC 8.6 07/08/2018   HGB 11.9 (L) 07/08/2018   HCT 37.8 07/08/2018   MCV 87.1 07/08/2018   PLT 273 07/08/2018      Chemistry      Component Value Date/Time   NA 139 07/08/2018 0841   K 4.0 07/08/2018 0841   CL 108 07/08/2018 0841   CO2 21 (L) 07/08/2018 0841   BUN 21 07/08/2018 0841   CREATININE 1.10 (H) 07/08/2018 0841      Component Value Date/Time   CALCIUM 9.8 07/08/2018 0841   ALKPHOS 116 07/08/2018 0841   AST 14 (L) 07/08/2018 0841   ALT 14 07/08/2018 0841   BILITOT 0.3 07/08/2018 0841        RADIOGRAPHIC STUDIES:  No results found.   ASSESSMENT/PLAN:  This is a very pleasant 63 year old [name] with stage IIIb non-small cell lung cancer, adenocarcinoma.  She presented with a large right upper lobe lung mass in addition to right hilar and mediastinal lymphadenopathy.  She was diagnosed in March 2019.   The patient underwent a course of concurrent chemoradiation with weekly carboplatin and paclitaxel.  She is status post 7 cycles.  She tolerated the treatment fairly well without any concerning complaints. She is currently undergoing consolidation immunotherapy with Imfinzi 10 mg/kg IV every 2 weeks.  She is status post 22 cycles.  The patient was seen with Dr. Julien Nordmann today.  Labs were reviewed with the patient.  We recommend that she proceed with cycle #23 today as scheduled. I will see her back for follow-up visit in 2 weeks for evaluation before starting cycle #24. The patient was advised to call immediately if she has any concerning symptoms in the interval. The patient voices understanding of current disease status and treatment options and is in agreement with the current care plan. All questions were answered. The patient knows to call the clinic with any problems, questions or concerns. We can certainly see the patient much sooner if necessary  No  orders of the defined types were placed in this encounter.    Cassandra L Heilingoetter, PA-C 07/08/18  ADDENDUM: Hematology/Oncology Attending: I had a face-to-face encounter with the patient today.  I recommended her care plan.  This is a very pleasant 63 years old African-American with a stage IIIb non-small cell lung cancer, adenocarcinoma status post a course of concurrent chemoradiation with weekly carboplatin and paclitaxel with partial response. She is currently undergoing consolidation treatment with immunotherapy with Imfinzi every 2 weeks status post 22 cycles and has been tolerating this  treatment well. I recommended for her to proceed with cycle #23 today as planned. She will come back for follow-up visit.  2 weeks for evaluation before starting cycle #24. The patient was advised to call immediately if she has any concerning symptoms in the interval. Disclaimer: This note was dictated with voice recognition software. Similar sounding words can inadvertently be transcribed and may be missed upon review. Eilleen Kempf, MD 07/08/18

## 2018-07-08 NOTE — Patient Instructions (Signed)
[facility] Discharge Instructions for Patients Receiving Chemotherapy  Today you received the following chemotherapy agents: Imfinzi.  To help prevent nausea and vomiting after your treatment, we encourage you to take your nausea medication as directed.   If you develop nausea and vomiting that is not controlled by your nausea medication, call the clinic.   BELOW ARE SYMPTOMS THAT SHOULD BE REPORTED IMMEDIATELY:  *FEVER GREATER THAN 100.5 F  *CHILLS WITH OR WITHOUT FEVER  NAUSEA AND VOMITING THAT IS NOT CONTROLLED WITH YOUR NAUSEA MEDICATION  *UNUSUAL SHORTNESS OF BREATH  *UNUSUAL BRUISING OR BLEEDING  TENDERNESS IN MOUTH AND THROAT WITH OR WITHOUT PRESENCE OF ULCERS  *URINARY PROBLEMS  *BOWEL PROBLEMS  UNUSUAL RASH Items with * indicate a potential emergency and should be followed up as soon as possible.  Feel free to call the clinic should you have any questions or concerns. The clinic phone number is [phone].  Please show the CHEMO ALERT CARD at check-in to the Emergency Department and triage nurse.   

## 2018-07-22 ENCOUNTER — Inpatient Hospital Stay

## 2018-07-22 ENCOUNTER — Other Ambulatory Visit: Payer: Self-pay

## 2018-07-22 ENCOUNTER — Inpatient Hospital Stay: Attending: Internal Medicine

## 2018-07-22 ENCOUNTER — Encounter: Payer: Self-pay | Admitting: Internal Medicine

## 2018-07-22 ENCOUNTER — Inpatient Hospital Stay (HOSPITAL_BASED_OUTPATIENT_CLINIC_OR_DEPARTMENT_OTHER): Admitting: Internal Medicine

## 2018-07-22 VITALS — BP 122/63 | HR 83 | Temp 97.6°F | Resp 18 | Ht 63.0 in | Wt 201.7 lb

## 2018-07-22 DIAGNOSIS — C781 Secondary malignant neoplasm of mediastinum: Secondary | ICD-10-CM | POA: Insufficient documentation

## 2018-07-22 DIAGNOSIS — C3411 Malignant neoplasm of upper lobe, right bronchus or lung: Secondary | ICD-10-CM

## 2018-07-22 DIAGNOSIS — Z5112 Encounter for antineoplastic immunotherapy: Secondary | ICD-10-CM

## 2018-07-22 DIAGNOSIS — I10 Essential (primary) hypertension: Secondary | ICD-10-CM | POA: Diagnosis not present

## 2018-07-22 DIAGNOSIS — Z9221 Personal history of antineoplastic chemotherapy: Secondary | ICD-10-CM | POA: Diagnosis not present

## 2018-07-22 DIAGNOSIS — Z79899 Other long term (current) drug therapy: Secondary | ICD-10-CM | POA: Insufficient documentation

## 2018-07-22 DIAGNOSIS — Z923 Personal history of irradiation: Secondary | ICD-10-CM | POA: Diagnosis not present

## 2018-07-22 LAB — CBC WITH DIFFERENTIAL (CANCER CENTER ONLY)
Abs Immature Granulocytes: 0.06 10*3/uL (ref 0.00–0.07)
Basophils Absolute: 0.1 10*3/uL (ref 0.0–0.1)
Basophils Relative: 1 %
Eosinophils Absolute: 0.1 10*3/uL (ref 0.0–0.5)
Eosinophils Relative: 1 %
HCT: 38.6 % (ref 36.0–46.0)
Hemoglobin: 12.2 g/dL (ref 12.0–15.0)
Immature Granulocytes: 1 %
Lymphocytes Relative: 15 %
Lymphs Abs: 1.2 10*3/uL (ref 0.7–4.0)
MCH: 27.1 pg (ref 26.0–34.0)
MCHC: 31.6 g/dL (ref 30.0–36.0)
MCV: 85.8 fL (ref 80.0–100.0)
Monocytes Absolute: 0.7 10*3/uL (ref 0.1–1.0)
Monocytes Relative: 8 %
Neutro Abs: 6 10*3/uL (ref 1.7–7.7)
Neutrophils Relative %: 74 %
Platelet Count: 267 10*3/uL (ref 150–400)
RBC: 4.5 MIL/uL (ref 3.87–5.11)
RDW: 15.2 % (ref 11.5–15.5)
WBC Count: 8.1 10*3/uL (ref 4.0–10.5)
nRBC: 0 % (ref 0.0–0.2)

## 2018-07-22 LAB — CMP (CANCER CENTER ONLY)
ALT: 13 U/L (ref 0–44)
AST: 15 U/L (ref 15–41)
Albumin: 3.8 g/dL (ref 3.5–5.0)
Alkaline Phosphatase: 113 U/L (ref 38–126)
Anion gap: 10 (ref 5–15)
BUN: 20 mg/dL (ref 8–23)
CO2: 21 mmol/L — ABNORMAL LOW (ref 22–32)
Calcium: 9.3 mg/dL (ref 8.9–10.3)
Chloride: 107 mmol/L (ref 98–111)
Creatinine: 1.02 mg/dL — ABNORMAL HIGH (ref 0.44–1.00)
GFR, Est AFR Am: >60 mL/min (ref >60)
GFR, Estimated: 58 mL/min — ABNORMAL LOW (ref >60)
Glucose, Bld: 100 mg/dL — ABNORMAL HIGH (ref 70–99)
Potassium: 4.2 mmol/L (ref 3.5–5.1)
Sodium: 138 mmol/L (ref 135–145)
Total Bilirubin: 0.3 mg/dL (ref 0.3–1.2)
Total Protein: 7.7 g/dL (ref 6.5–8.1)

## 2018-07-22 MED ORDER — SODIUM CHLORIDE 0.9 % IV SOLN
10.0000 mg/kg | Freq: Once | INTRAVENOUS | Status: AC
  Administered 2018-07-22: 14:00:00 860 mg via INTRAVENOUS
  Filled 2018-07-22: qty 10

## 2018-07-22 MED ORDER — SODIUM CHLORIDE 0.9 % IV SOLN
Freq: Once | INTRAVENOUS | Status: AC
  Administered 2018-07-22: 13:00:00 via INTRAVENOUS
  Filled 2018-07-22: qty 250

## 2018-07-22 NOTE — Progress Notes (Signed)
    [name] [phone]   [phone]  OFFICE PROGRESS NOTE  Bland, Veita, MD [address] [name] [address]  DIAGNOSIS: stageIIIB (T3, N2, M0)non-small cell lung cancer, adenocarcinoma diagnosed in March 2019 and presented with large right upper lobe lung mass in addition to right hilar and mediastinal lymphadenopathy.  PD-L1 0%  PRIOR THERAPY: Concurrent chemoradiation with weekly carboplatin for an AUC of 2 and paclitaxel 45 mg/m2.  First dose expected on 06/01/2017.  Status post 7 cycles.  Last dose was giving 07/14/2017.  CURRENT THERAPY:  Consolidation treatment with immunotherapy with Imfinzi (Durvalumab) 10 mg/KG every 2 weeks status post 23 cycles..  INTERVAL HISTORY: Ashley Wilson 63 y.o. female returns to the clinic today for follow-up visit.  The patient is feeling fine today with no concerning complaints.  She denied having any chest pain, shortness of breath, cough or hemoptysis.  She has no nausea, vomiting, diarrhea or constipation.  She denied having any headache or visual changes.  She is tolerating her treatment with Imfinzi fairly well.  The patient is here today for evaluation before starting cycle #24.  MEDICAL HISTORY: Past Medical History:  Diagnosis Date  . Compression fracture of lumbar vertebra ([facility])   . Hypertension   . NSCL ca dx'd 05/2017    ALLERGIES:  is allergic to lisinopril-hydrochlorothiazide.  MEDICATIONS:  Current Outpatient Medications  Medication Sig Dispense Refill  . acetaminophen (TYLENOL) 500 MG tablet Take 1,000 mg by mouth every 6 (six) hours as needed for mild pain or moderate pain.    . amLODipine (NORVASC) 10 MG tablet Take 10 mg by mouth daily.    . clotrimazole-betamethasone (LOTRISONE) cream Apply 1 application topically 2 (two) times daily. (Patient not taking: Reported on 07/08/2018) 45 g 1  . hydrochlorothiazide (HYDRODIURIL) 25 MG tablet Take 1 tablet (25 mg total) by mouth  daily. 30 tablet 0  . prochlorperazine (COMPAZINE) 5 MG tablet Take 1 tablet by mouth every 6 (six) hours as needed.  0  . rosuvastatin (CRESTOR) 20 MG tablet Take 20 mg by mouth daily.    . spironolactone (ALDACTONE) 25 MG tablet Take 25 mg by mouth daily.     No current facility-administered medications for this visit.     SURGICAL HISTORY:  Past Surgical History:  Procedure Laterality Date  . BACK SURGERY  2009   ruptured disc    REVIEW OF SYSTEMS:  A comprehensive review of systems was negative.   PHYSICAL EXAMINATION: General appearance: alert, cooperative and no distress Head: Normocephalic, without obvious abnormality, atraumatic Neck: no adenopathy, no JVD, supple, symmetrical, trachea midline and thyroid not enlarged, symmetric, no tenderness/mass/nodules Lymph nodes: Cervical, supraclavicular, and axillary nodes normal. Resp: clear to auscultation bilaterally Back: symmetric, no curvature. ROM normal. No CVA tenderness. Cardio: regular rate and rhythm, S1, S2 normal, no murmur, click, rub or gallop GI: soft, non-tender; bowel sounds normal; no masses,  no organomegaly Extremities: extremities normal, atraumatic, no cyanosis or edema  ECOG PERFORMANCE STATUS: 0 - Asymptomatic  Blood pressure 122/63, pulse 83, temperature 97.6 F (36.4 C), temperature source Oral, resp. rate 18, height 5' 3" (1.6 m), weight 201 lb 11.2 oz (91.5 kg), SpO2 99 %.  LABORATORY DATA: Lab Results  Component Value Date   WBC 8.1 07/22/2018   HGB 12.2 07/22/2018   HCT 38.6 07/22/2018   MCV 85.8 07/22/2018   PLT 267 07/22/2018      Chemistry        Component Value Date/Time   NA 139 07/08/2018 0841   K 4.0 07/08/2018 0841   CL 108 07/08/2018 0841   CO2 21 (L) 07/08/2018 0841   BUN 21 07/08/2018 0841   CREATININE 1.10 (H) 07/08/2018 0841      Component Value Date/Time   CALCIUM 9.8 07/08/2018 0841   ALKPHOS 116 07/08/2018 0841   AST 14 (L) 07/08/2018 0841   ALT 14 07/08/2018 0841    BILITOT 0.3 07/08/2018 0841       RADIOGRAPHIC STUDIES: No results found.  ASSESSMENT AND PLAN: This is a very pleasant 63 years old [name] with stage IIIB non-small cell lung cancer, adenocarcinoma.   The patient underwent a course of concurrent chemoradiation with weekly carboplatin and paclitaxel status post 7 cycles.  She tolerated this course of treatment fairly well with no concerning complaints.   The patient is currently undergoing treatment with consolidation immunotherapy with Imfinzi (Durvalumab) 10 mg/KG every 2 weeks status post 23 cycles. She continues to tolerate this treatment well with no concerning adverse effects. I recommended for her to proceed with cycle #24 today as planned. I will see her back for follow-up visit in 2 weeks for evaluation before starting cycle #25. The patient was advised to call immediately if she has any concerning symptoms in the interval. The patient voices understanding of current disease status and treatment options and is in agreement with the current care plan. All questions were answered. The patient knows to call the clinic with any problems, questions or concerns. We can certainly see the patient much sooner if necessary.  Disclaimer: This note was dictated with voice recognition software. Similar sounding words can inadvertently be transcribed and may not be corrected upon review.

## 2018-07-22 NOTE — Patient Instructions (Signed)
[facility] Discharge Instructions for Patients Receiving Chemotherapy  Today you received the following chemotherapy agents Durvalumab (IMFINZI).  To help prevent nausea and vomiting after your treatment, we encourage you to take your nausea medication as prescribed.   If you develop nausea and vomiting that is not controlled by your nausea medication, call the clinic.   BELOW ARE SYMPTOMS THAT SHOULD BE REPORTED IMMEDIATELY:  *FEVER GREATER THAN 100.5 F  *CHILLS WITH OR WITHOUT FEVER  NAUSEA AND VOMITING THAT IS NOT CONTROLLED WITH YOUR NAUSEA MEDICATION  *UNUSUAL SHORTNESS OF BREATH  *UNUSUAL BRUISING OR BLEEDING  TENDERNESS IN MOUTH AND THROAT WITH OR WITHOUT PRESENCE OF ULCERS  *URINARY PROBLEMS  *BOWEL PROBLEMS  UNUSUAL RASH Items with * indicate a potential emergency and should be followed up as soon as possible.  Feel free to call the clinic should you have any questions or concerns. The clinic phone number is [phone].  Please show the [facility] at check-in to the Emergency Department and triage nurse.  Coronavirus (COVID-19) Are you at risk?  Are you at risk for the Coronavirus (COVID-19)?  To be considered HIGH RISK for Coronavirus (COVID-19), you have to meet the following criteria:  . Traveled to [place], [place], [place], [place] or [place]; or in the [place] to [place], [place], [place], or [place]; and have fever, cough, and shortness of breath within the last 2 weeks of travel OR . Been in close contact with a person diagnosed with COVID-19 within the last 2 weeks and have fever, cough, and shortness of breath . IF YOU DO NOT MEET THESE CRITERIA, YOU ARE CONSIDERED LOW RISK FOR COVID-19.  What to do if you are HIGH RISK for COVID-19?  Marland Kitchen If you are having a medical emergency, call 911. . Seek medical care right away. Before you go to a doctor's office, urgent care or emergency department, call ahead and tell them  about your recent travel, contact with someone diagnosed with COVID-19, and your symptoms. You should receive instructions from your physician's office regarding next steps of care.  . When you arrive at healthcare provider, tell the healthcare staff immediately you have returned from visiting [place], [place], [place], [place] or [place]; or traveled in the [place] to [place], [place], [place], or [place]; in the last two weeks or you have been in close contact with a person diagnosed with COVID-19 in the last 2 weeks.   . Tell the health care staff about your symptoms: fever, cough and shortness of breath. . After you have been seen by a medical provider, you will be either: o Tested for (COVID-19) and discharged home on quarantine except to seek medical care if symptoms worsen, and asked to  - Stay home and avoid contact with others until you get your results (4-5 days)  - Avoid travel on public transportation if possible (such as bus, train, or airplane) or o Sent to the Emergency Department by EMS for evaluation, COVID-19 testing, and possible admission depending on your condition and test results.  What to do if you are LOW RISK for COVID-19?  Reduce your risk of any infection by using the same precautions used for avoiding the common cold or flu:  Marland Kitchen Wash your hands often with soap and warm water for at least 20 seconds.  If soap and water are not readily available, use an alcohol-based hand sanitizer with at least 60% alcohol.  . If coughing or  sneezing, cover your mouth and nose by coughing or sneezing into the elbow areas of your shirt or coat, into a tissue or into your sleeve (not your hands). . Avoid shaking hands with others and consider head nods or verbal greetings only. . Avoid touching your eyes, nose, or mouth with unwashed hands.  . Avoid close contact with people who are sick. . Avoid places or events with large numbers of people in one location, like concerts or  sporting events. . Carefully consider travel plans you have or are making. . If you are planning any travel outside or inside the [place], visit the CDC's Travelers' Health webpage for the latest health notices. . If you have some symptoms but not all symptoms, continue to monitor at home and seek medical attention if your symptoms worsen. . If you are having a medical emergency, call 911.   [place] / e-Visit: [email or web address]         [facility]: [facility]: [phone]                   [facility]: [phone]

## 2018-08-05 ENCOUNTER — Inpatient Hospital Stay

## 2018-08-05 ENCOUNTER — Other Ambulatory Visit: Payer: Self-pay

## 2018-08-05 ENCOUNTER — Encounter: Payer: Self-pay | Admitting: Internal Medicine

## 2018-08-05 ENCOUNTER — Inpatient Hospital Stay (HOSPITAL_BASED_OUTPATIENT_CLINIC_OR_DEPARTMENT_OTHER): Admitting: Internal Medicine

## 2018-08-05 VITALS — BP 110/58 | HR 77 | Temp 98.7°F | Resp 18 | Ht 63.0 in | Wt 201.5 lb

## 2018-08-05 DIAGNOSIS — Z5112 Encounter for antineoplastic immunotherapy: Secondary | ICD-10-CM | POA: Diagnosis not present

## 2018-08-05 DIAGNOSIS — I10 Essential (primary) hypertension: Secondary | ICD-10-CM

## 2018-08-05 DIAGNOSIS — C3411 Malignant neoplasm of upper lobe, right bronchus or lung: Secondary | ICD-10-CM

## 2018-08-05 DIAGNOSIS — Z9221 Personal history of antineoplastic chemotherapy: Secondary | ICD-10-CM | POA: Diagnosis not present

## 2018-08-05 DIAGNOSIS — Z923 Personal history of irradiation: Secondary | ICD-10-CM

## 2018-08-05 DIAGNOSIS — Z79899 Other long term (current) drug therapy: Secondary | ICD-10-CM

## 2018-08-05 LAB — CBC WITH DIFFERENTIAL (CANCER CENTER ONLY)
Abs Immature Granulocytes: 0.05 10*3/uL (ref 0.00–0.07)
Basophils Absolute: 0.1 10*3/uL (ref 0.0–0.1)
Basophils Relative: 1 %
Eosinophils Absolute: 0.2 10*3/uL (ref 0.0–0.5)
Eosinophils Relative: 2 %
HCT: 38.5 % (ref 36.0–46.0)
Hemoglobin: 12 g/dL (ref 12.0–15.0)
Immature Granulocytes: 1 %
Lymphocytes Relative: 13 %
Lymphs Abs: 1.1 10*3/uL (ref 0.7–4.0)
MCH: 27 pg (ref 26.0–34.0)
MCHC: 31.2 g/dL (ref 30.0–36.0)
MCV: 86.7 fL (ref 80.0–100.0)
Monocytes Absolute: 0.7 10*3/uL (ref 0.1–1.0)
Monocytes Relative: 8 %
Neutro Abs: 5.8 10*3/uL (ref 1.7–7.7)
Neutrophils Relative %: 75 %
Platelet Count: 273 10*3/uL (ref 150–400)
RBC: 4.44 MIL/uL (ref 3.87–5.11)
RDW: 15.3 % (ref 11.5–15.5)
WBC Count: 7.8 10*3/uL (ref 4.0–10.5)
nRBC: 0 % (ref 0.0–0.2)

## 2018-08-05 LAB — CMP (CANCER CENTER ONLY)
ALT: 14 U/L (ref 0–44)
AST: 15 U/L (ref 15–41)
Albumin: 3.8 g/dL (ref 3.5–5.0)
Alkaline Phosphatase: 121 U/L (ref 38–126)
Anion gap: 11 (ref 5–15)
BUN: 17 mg/dL (ref 8–23)
CO2: 21 mmol/L — ABNORMAL LOW (ref 22–32)
Calcium: 9.4 mg/dL (ref 8.9–10.3)
Chloride: 109 mmol/L (ref 98–111)
Creatinine: 1.06 mg/dL — ABNORMAL HIGH (ref 0.44–1.00)
GFR, Est AFR Am: >60 mL/min (ref >60)
GFR, Estimated: 55 mL/min — ABNORMAL LOW (ref >60)
Glucose, Bld: 100 mg/dL — ABNORMAL HIGH (ref 70–99)
Potassium: 4.1 mmol/L (ref 3.5–5.1)
Sodium: 141 mmol/L (ref 135–145)
Total Bilirubin: 0.3 mg/dL (ref 0.3–1.2)
Total Protein: 8.1 g/dL (ref 6.5–8.1)

## 2018-08-05 LAB — TSH: TSH: 7.465 u[IU]/mL — ABNORMAL HIGH (ref 0.308–3.960)

## 2018-08-05 MED ORDER — SODIUM CHLORIDE 0.9 % IV SOLN
10.0000 mg/kg | Freq: Once | INTRAVENOUS | Status: AC
  Administered 2018-08-05: 13:00:00 860 mg via INTRAVENOUS
  Filled 2018-08-05: qty 10

## 2018-08-05 MED ORDER — SODIUM CHLORIDE 0.9 % IV SOLN
Freq: Once | INTRAVENOUS | Status: AC
  Administered 2018-08-05: 12:00:00 via INTRAVENOUS
  Filled 2018-08-05: qty 250

## 2018-08-05 NOTE — Patient Instructions (Signed)
[facility] Discharge Instructions for Patients Receiving Chemotherapy  Today you received the following chemotherapy agents: Imfinzi.  To help prevent nausea and vomiting after your treatment, we encourage you to take your nausea medication as directed.   If you develop nausea and vomiting that is not controlled by your nausea medication, call the clinic.   BELOW ARE SYMPTOMS THAT SHOULD BE REPORTED IMMEDIATELY:  *FEVER GREATER THAN 100.5 F  *CHILLS WITH OR WITHOUT FEVER  NAUSEA AND VOMITING THAT IS NOT CONTROLLED WITH YOUR NAUSEA MEDICATION  *UNUSUAL SHORTNESS OF BREATH  *UNUSUAL BRUISING OR BLEEDING  TENDERNESS IN MOUTH AND THROAT WITH OR WITHOUT PRESENCE OF ULCERS  *URINARY PROBLEMS  *BOWEL PROBLEMS  UNUSUAL RASH Items with * indicate a potential emergency and should be followed up as soon as possible.  Feel free to call the clinic should you have any questions or concerns. The clinic phone number is [phone].  Please show the CHEMO ALERT CARD at check-in to the Emergency Department and triage nurse.   

## 2018-08-05 NOTE — Progress Notes (Signed)
[place] [phone]   Fax:([phone]) [phone]  OFFICE PROGRESS NOTE  Ashley Lei, MD [address] [place] [address]  DIAGNOSIS: stageIIIB (T3, N2, M0)non-small cell lung cancer, adenocarcinoma diagnosed in March 2019 and presented with large right upper lobe lung mass in addition to right hilar and mediastinal lymphadenopathy.  PD-L1 0%  PRIOR THERAPY: Concurrent chemoradiation with weekly carboplatin for an AUC of 2 and paclitaxel 45 mg/m2.  First dose expected on 06/01/2017.  Status post 7 cycles.  Last dose was giving 07/14/2017.  CURRENT THERAPY:  Consolidation treatment with immunotherapy with Imfinzi (Durvalumab) 10 mg/KG every 2 weeks status post 24 cycles..  INTERVAL HISTORY: Ashley Wilson 63 y.o. female returns to the clinic today for follow-up visit.  The patient is feeling fine today with no concerning complaints.  She denied having any weight loss or night sweats.  She has no chest pain, shortness of breath, cough or hemoptysis.  She has no fever or chills.  She continues to tolerate her consolidation treatment with Imfinzi fairly well.  The patient is here today for evaluation before starting cycle #25.  MEDICAL HISTORY: Past Medical History:  Diagnosis Date  . Compression fracture of lumbar vertebra ([place])   . Hypertension   . NSCL ca dx'd 05/2017    ALLERGIES:  is allergic to lisinopril-hydrochlorothiazide.  MEDICATIONS:  Current Outpatient Medications  Medication Sig Dispense Refill  . acetaminophen (TYLENOL) 500 MG tablet Take 1,000 mg by mouth every 6 (six) hours as needed for mild pain or moderate pain.    Marland Kitchen amLODipine (NORVASC) 10 MG tablet Take 10 mg by mouth daily.    . clotrimazole-betamethasone (LOTRISONE) cream Apply 1 application topically 2 (two) times daily. (Patient not taking: Reported on 07/08/2018) 45 g 1  . hydrochlorothiazide (HYDRODIURIL) 25 MG tablet Take 1 tablet (25 mg total) by mouth daily. 30 tablet 0   . prochlorperazine (COMPAZINE) 5 MG tablet Take 1 tablet by mouth every 6 (six) hours as needed.  0  . rosuvastatin (CRESTOR) 20 MG tablet Take 20 mg by mouth daily.    Marland Kitchen spironolactone (ALDACTONE) 25 MG tablet Take 25 mg by mouth daily.     No current facility-administered medications for this visit.     SURGICAL HISTORY:  Past Surgical History:  Procedure Laterality Date  . BACK SURGERY  2009   ruptured disc    REVIEW OF SYSTEMS:  A comprehensive review of systems was negative.   PHYSICAL EXAMINATION: General appearance: alert, cooperative and no distress Head: Normocephalic, without obvious abnormality, atraumatic Neck: no adenopathy, no JVD, supple, symmetrical, trachea midline and thyroid not enlarged, symmetric, no tenderness/mass/nodules Lymph nodes: Cervical, supraclavicular, and axillary nodes normal. Resp: clear to auscultation bilaterally Back: symmetric, no curvature. ROM normal. No CVA tenderness. Cardio: regular rate and rhythm, S1, S2 normal, no murmur, click, rub or gallop GI: soft, non-tender; bowel sounds normal; no masses,  no organomegaly Extremities: extremities normal, atraumatic, no cyanosis or edema  ECOG PERFORMANCE STATUS: 0 - Asymptomatic  Blood pressure (!) 110/58, pulse 77, temperature 98.7 F (37.1 C), temperature source Oral, resp. rate 18, height 5' 3"  (1.6 m), weight 201 lb 8 oz (91.4 kg), SpO2 99 %.  LABORATORY DATA: Lab Results  Component Value Date   WBC 7.8 08/05/2018   HGB 12.0 08/05/2018   HCT 38.5 08/05/2018   MCV 86.7 08/05/2018   PLT 273 08/05/2018      Chemistry  Component Value Date/Time   NA 141 08/05/2018 1028   K 4.1 08/05/2018 1028   CL 109 08/05/2018 1028   CO2 21 (L) 08/05/2018 1028   BUN 17 08/05/2018 1028   CREATININE 1.06 (H) 08/05/2018 1028      Component Value Date/Time   CALCIUM 9.4 08/05/2018 1028   ALKPHOS 121 08/05/2018 1028   AST 15 08/05/2018 1028   ALT 14 08/05/2018 1028   BILITOT 0.3  08/05/2018 1028       RADIOGRAPHIC STUDIES: No results found.  ASSESSMENT AND PLAN: This is a very pleasant 63 years old [name] with stage IIIB non-small cell lung cancer, adenocarcinoma.   The patient underwent a course of concurrent chemoradiation with weekly carboplatin and paclitaxel status post 7 cycles.  She tolerated this course of treatment fairly well with no concerning complaints.   The patient is currently undergoing treatment with consolidation immunotherapy with Imfinzi (Durvalumab) 10 mg/KG every 2 weeks status post 24 cycles. She has been tolerating this treatment well with no concerning adverse effects. I recommended for her to proceed with cycle #25 today as planned. I will see the patient back for follow-up visit in 2 weeks for evaluation before the last cycle of her treatment. She was advised to call immediately if she has any concerning symptoms in the interval. The patient voices understanding of current disease status and treatment options and is in agreement with the current care plan. All questions were answered. The patient knows to call the clinic with any problems, questions or concerns. We can certainly see the patient much sooner if necessary.  Disclaimer: This note was dictated with voice recognition software. Similar sounding words can inadvertently be transcribed and may not be corrected upon review.

## 2018-08-19 ENCOUNTER — Inpatient Hospital Stay (HOSPITAL_BASED_OUTPATIENT_CLINIC_OR_DEPARTMENT_OTHER): Admitting: Internal Medicine

## 2018-08-19 ENCOUNTER — Other Ambulatory Visit: Payer: Self-pay

## 2018-08-19 ENCOUNTER — Encounter: Payer: Self-pay | Admitting: Internal Medicine

## 2018-08-19 ENCOUNTER — Inpatient Hospital Stay: Attending: Internal Medicine

## 2018-08-19 ENCOUNTER — Inpatient Hospital Stay

## 2018-08-19 VITALS — BP 125/67 | HR 89 | Temp 98.7°F | Resp 18 | Ht 63.0 in | Wt 201.6 lb

## 2018-08-19 DIAGNOSIS — R0609 Other forms of dyspnea: Secondary | ICD-10-CM | POA: Insufficient documentation

## 2018-08-19 DIAGNOSIS — Z923 Personal history of irradiation: Secondary | ICD-10-CM | POA: Insufficient documentation

## 2018-08-19 DIAGNOSIS — Z9221 Personal history of antineoplastic chemotherapy: Secondary | ICD-10-CM | POA: Diagnosis not present

## 2018-08-19 DIAGNOSIS — I10 Essential (primary) hypertension: Secondary | ICD-10-CM | POA: Diagnosis not present

## 2018-08-19 DIAGNOSIS — C3411 Malignant neoplasm of upper lobe, right bronchus or lung: Secondary | ICD-10-CM | POA: Insufficient documentation

## 2018-08-19 DIAGNOSIS — Z79899 Other long term (current) drug therapy: Secondary | ICD-10-CM | POA: Insufficient documentation

## 2018-08-19 DIAGNOSIS — Z5112 Encounter for antineoplastic immunotherapy: Secondary | ICD-10-CM

## 2018-08-19 DIAGNOSIS — E039 Hypothyroidism, unspecified: Secondary | ICD-10-CM | POA: Insufficient documentation

## 2018-08-19 DIAGNOSIS — Z87311 Personal history of (healed) other pathological fracture: Secondary | ICD-10-CM | POA: Insufficient documentation

## 2018-08-19 DIAGNOSIS — C3490 Malignant neoplasm of unspecified part of unspecified bronchus or lung: Secondary | ICD-10-CM

## 2018-08-19 LAB — CMP (CANCER CENTER ONLY)
ALT: 14 U/L (ref 0–44)
AST: 16 U/L (ref 15–41)
Albumin: 3.8 g/dL (ref 3.5–5.0)
Alkaline Phosphatase: 129 U/L — ABNORMAL HIGH (ref 38–126)
Anion gap: 13 (ref 5–15)
BUN: 29 mg/dL — ABNORMAL HIGH (ref 8–23)
CO2: 19 mmol/L — ABNORMAL LOW (ref 22–32)
Calcium: 9.5 mg/dL (ref 8.9–10.3)
Chloride: 108 mmol/L (ref 98–111)
Creatinine: 1.25 mg/dL — ABNORMAL HIGH (ref 0.44–1.00)
GFR, Est AFR Am: 53 mL/min — ABNORMAL LOW (ref >60)
GFR, Estimated: 45 mL/min — ABNORMAL LOW (ref >60)
Glucose, Bld: 112 mg/dL — ABNORMAL HIGH (ref 70–99)
Potassium: 3.9 mmol/L (ref 3.5–5.1)
Sodium: 140 mmol/L (ref 135–145)
Total Bilirubin: 0.3 mg/dL (ref 0.3–1.2)
Total Protein: 8.4 g/dL — ABNORMAL HIGH (ref 6.5–8.1)

## 2018-08-19 LAB — CBC WITH DIFFERENTIAL (CANCER CENTER ONLY)
Abs Immature Granulocytes: 0.07 10*3/uL (ref 0.00–0.07)
Basophils Absolute: 0.1 10*3/uL (ref 0.0–0.1)
Basophils Relative: 1 %
Eosinophils Absolute: 0.1 10*3/uL (ref 0.0–0.5)
Eosinophils Relative: 2 %
HCT: 38.8 % (ref 36.0–46.0)
Hemoglobin: 12.5 g/dL (ref 12.0–15.0)
Immature Granulocytes: 1 %
Lymphocytes Relative: 12 %
Lymphs Abs: 1 10*3/uL (ref 0.7–4.0)
MCH: 27.3 pg (ref 26.0–34.0)
MCHC: 32.2 g/dL (ref 30.0–36.0)
MCV: 84.7 fL (ref 80.0–100.0)
Monocytes Absolute: 0.6 10*3/uL (ref 0.1–1.0)
Monocytes Relative: 8 %
Neutro Abs: 5.9 10*3/uL (ref 1.7–7.7)
Neutrophils Relative %: 76 %
Platelet Count: 289 10*3/uL (ref 150–400)
RBC: 4.58 MIL/uL (ref 3.87–5.11)
RDW: 14.7 % (ref 11.5–15.5)
WBC Count: 7.8 10*3/uL (ref 4.0–10.5)
nRBC: 0 % (ref 0.0–0.2)

## 2018-08-19 MED ORDER — SODIUM CHLORIDE 0.9 % IV SOLN
Freq: Once | INTRAVENOUS | Status: AC
  Administered 2018-08-19: 12:00:00 via INTRAVENOUS
  Filled 2018-08-19: qty 250

## 2018-08-19 MED ORDER — SODIUM CHLORIDE 0.9 % IV SOLN
10.0000 mg/kg | Freq: Once | INTRAVENOUS | Status: AC
  Administered 2018-08-19: 860 mg via INTRAVENOUS
  Filled 2018-08-19: qty 10

## 2018-08-19 NOTE — Progress Notes (Signed)
[place] [phone]   [phone]) [phone]  OFFICE PROGRESS NOTE  [name], MD [address] [place] [address]  DIAGNOSIS: stageIIIB (T3, N2, M0)non-small cell lung cancer, adenocarcinoma diagnosed in March 2019 and presented with large right upper lobe lung mass in addition to right hilar and mediastinal lymphadenopathy.  PD-L1 0%  PRIOR THERAPY: Concurrent chemoradiation with weekly carboplatin for an AUC of 2 and paclitaxel 45 mg/m2.  First dose expected on 06/01/2017.  Status post 7 cycles.  Last dose was giving 07/14/2017.  CURRENT THERAPY:  Consolidation treatment with immunotherapy with Imfinzi (Durvalumab) 10 mg/KG every 2 weeks status post 25 cycles..  INTERVAL HISTORY: Ashley Wilson 63 y.o. female returns to the clinic today for follow-up visit.  The patient is feeling fine today with no concerning complaints.  She denied having any chest pain, shortness of breath, cough or hemoptysis.  She denied having any fever or chills.  She has no nausea, vomiting, diarrhea or constipation.  She continues to tolerate her treatment with Imfinzi fairly well.  She is here today for evaluation before the last cycle of her treatment.   MEDICAL HISTORY: Past Medical History:  Diagnosis Date  . Compression fracture of lumbar vertebra ([place])   . Hypertension   . NSCL ca dx'd 05/2017    ALLERGIES:  is allergic to lisinopril-hydrochlorothiazide.  MEDICATIONS:  Current Outpatient Medications  Medication Sig Dispense Refill  . acetaminophen (TYLENOL) 500 MG tablet Take 1,000 mg by mouth every 6 (six) hours as needed for mild pain or moderate pain.    Marland Kitchen amLODipine (NORVASC) 10 MG tablet Take 10 mg by mouth daily.    . clotrimazole-betamethasone (LOTRISONE) cream Apply 1 application topically 2 (two) times daily. (Patient not taking: Reported on 07/08/2018) 45 g 1  . hydrochlorothiazide (HYDRODIURIL) 25 MG tablet Take 1 tablet (25 mg total) by mouth  daily. 30 tablet 0  . prochlorperazine (COMPAZINE) 5 MG tablet Take 1 tablet by mouth every 6 (six) hours as needed.  0  . rosuvastatin (CRESTOR) 20 MG tablet Take 20 mg by mouth daily.    Marland Kitchen spironolactone (ALDACTONE) 25 MG tablet Take 25 mg by mouth daily.     No current facility-administered medications for this visit.     SURGICAL HISTORY:  Past Surgical History:  Procedure Laterality Date  . BACK SURGERY  2009   ruptured disc    REVIEW OF SYSTEMS:  A comprehensive review of systems was negative.   PHYSICAL EXAMINATION: General appearance: alert, cooperative and no distress Head: Normocephalic, without obvious abnormality, atraumatic Neck: no adenopathy, no JVD, supple, symmetrical, trachea midline and thyroid not enlarged, symmetric, no tenderness/mass/nodules Lymph nodes: Cervical, supraclavicular, and axillary nodes normal. Resp: clear to auscultation bilaterally Back: symmetric, no curvature. ROM normal. No CVA tenderness. Cardio: regular rate and rhythm, S1, S2 normal, no murmur, click, rub or gallop GI: soft, non-tender; bowel sounds normal; no masses,  no organomegaly Extremities: extremities normal, atraumatic, no cyanosis or edema  ECOG PERFORMANCE STATUS: 0 - Asymptomatic  Blood pressure 125/67, pulse 89, temperature 98.7 F (37.1 C), temperature source Oral, resp. rate 18, height _0  (1.6 m), weight 201 lb 9.6 oz (91.4 kg), SpO2 98 %.  LABORATORY DATA: Lab Results  Component Value Date   WBC 7.8 08/05/2018   HGB 12.0 08/05/2018   HCT 38.5 08/05/2018   MCV 86.7 08/05/2018   PLT 273 08/05/2018      Chemistry  Component Value Date/Time   NA 141 08/05/2018 1028   K 4.1 08/05/2018 1028   CL 109 08/05/2018 1028   CO2 21 (L) 08/05/2018 1028   BUN 17 08/05/2018 1028   CREATININE 1.06 (H) 08/05/2018 1028      Component Value Date/Time   CALCIUM 9.4 08/05/2018 1028   ALKPHOS 121 08/05/2018 1028   AST 15 08/05/2018 1028   ALT 14 08/05/2018 1028    BILITOT 0.3 08/05/2018 1028       RADIOGRAPHIC STUDIES: No results found.  ASSESSMENT AND PLAN: This is a very pleasant 63 years old [name] with stage IIIB non-small cell lung cancer, adenocarcinoma.   The patient underwent a course of concurrent chemoradiation with weekly carboplatin and paclitaxel status post 7 cycles.  She tolerated this course of treatment fairly well with no concerning complaints.   The patient is currently undergoing treatment with consolidation immunotherapy with Imfinzi (Durvalumab) 10 mg/KG every 2 weeks status post 25 cycles. The patient continues to tolerate this treatment well with no concerning adverse effects. I recommended for her to proceed with cycle #26 today as planned. I will see her back for follow-up visit in around 3 weeks with repeat CT scan of the chest for restaging of her disease. She was advised to call immediately if she has any concerning symptoms in the interval. The patient voices understanding of current disease status and treatment options and is in agreement with the current care plan. All questions were answered. The patient knows to call the clinic with any problems, questions or concerns. We can certainly see the patient much sooner if necessary.  Disclaimer: This note was dictated with voice recognition software. Similar sounding words can inadvertently be transcribed and may not be corrected upon review.

## 2018-08-19 NOTE — Patient Instructions (Signed)
[facility] Discharge Instructions for Patients Receiving Chemotherapy  Today you received the following chemotherapy agents: Imfinzi.  To help prevent nausea and vomiting after your treatment, we encourage you to take your nausea medication as directed.   If you develop nausea and vomiting that is not controlled by your nausea medication, call the clinic.   BELOW ARE SYMPTOMS THAT SHOULD BE REPORTED IMMEDIATELY:  *FEVER GREATER THAN 100.5 F  *CHILLS WITH OR WITHOUT FEVER  NAUSEA AND VOMITING THAT IS NOT CONTROLLED WITH YOUR NAUSEA MEDICATION  *UNUSUAL SHORTNESS OF BREATH  *UNUSUAL BRUISING OR BLEEDING  TENDERNESS IN MOUTH AND THROAT WITH OR WITHOUT PRESENCE OF ULCERS  *URINARY PROBLEMS  *BOWEL PROBLEMS  UNUSUAL RASH Items with * indicate a potential emergency and should be followed up as soon as possible.  Feel free to call the clinic should you have any questions or concerns. The clinic phone number is [phone].  Please show the CHEMO ALERT CARD at check-in to the Emergency Department and triage nurse.   

## 2018-08-30 ENCOUNTER — Telehealth: Payer: Self-pay | Admitting: Oncology

## 2018-09-03 ENCOUNTER — Other Ambulatory Visit: Payer: Self-pay

## 2018-09-03 ENCOUNTER — Inpatient Hospital Stay

## 2018-09-03 ENCOUNTER — Other Ambulatory Visit

## 2018-09-03 ENCOUNTER — Ambulatory Visit (HOSPITAL_COMMUNITY)
Admission: RE | Admit: 2018-09-03 | Discharge: 2018-09-03 | Disposition: A | Discharge status: Home / Self Care | Source: Ambulatory Visit | Attending: Internal Medicine | Admitting: Internal Medicine

## 2018-09-03 ENCOUNTER — Encounter (HOSPITAL_COMMUNITY): Payer: Self-pay

## 2018-09-03 DIAGNOSIS — C3490 Malignant neoplasm of unspecified part of unspecified bronchus or lung: Secondary | ICD-10-CM | POA: Diagnosis not present

## 2018-09-03 LAB — CBC WITH DIFFERENTIAL (CANCER CENTER ONLY)
Abs Immature Granulocytes: 0.06 10*3/uL (ref 0.00–0.07)
Basophils Absolute: 0.1 10*3/uL (ref 0.0–0.1)
Basophils Relative: 1 %
Eosinophils Absolute: 0.2 10*3/uL (ref 0.0–0.5)
Eosinophils Relative: 3 %
HCT: 38.6 % (ref 36.0–46.0)
Hemoglobin: 12.5 g/dL (ref 12.0–15.0)
Immature Granulocytes: 1 %
Lymphocytes Relative: 15 %
Lymphs Abs: 1.2 10*3/uL (ref 0.7–4.0)
MCH: 27.2 pg (ref 26.0–34.0)
MCHC: 32.4 g/dL (ref 30.0–36.0)
MCV: 83.9 fL (ref 80.0–100.0)
Monocytes Absolute: 0.7 10*3/uL (ref 0.1–1.0)
Monocytes Relative: 9 %
Neutro Abs: 5.8 10*3/uL (ref 1.7–7.7)
Neutrophils Relative %: 71 %
Platelet Count: 263 10*3/uL (ref 150–400)
RBC: 4.6 MIL/uL (ref 3.87–5.11)
RDW: 14.6 % (ref 11.5–15.5)
WBC Count: 8.1 10*3/uL (ref 4.0–10.5)
nRBC: 0 % (ref 0.0–0.2)

## 2018-09-03 LAB — CMP (CANCER CENTER ONLY)
ALT: 16 U/L (ref 0–44)
AST: 19 U/L (ref 15–41)
Albumin: 3.8 g/dL (ref 3.5–5.0)
Alkaline Phosphatase: 124 U/L (ref 38–126)
Anion gap: 12 (ref 5–15)
BUN: 23 mg/dL (ref 8–23)
CO2: 21 mmol/L — ABNORMAL LOW (ref 22–32)
Calcium: 9.1 mg/dL (ref 8.9–10.3)
Chloride: 106 mmol/L (ref 98–111)
Creatinine: 1.08 mg/dL — ABNORMAL HIGH (ref 0.44–1.00)
GFR, Est AFR Am: >60 mL/min (ref >60)
GFR, Estimated: 54 mL/min — ABNORMAL LOW (ref >60)
Glucose, Bld: 112 mg/dL — ABNORMAL HIGH (ref 70–99)
Potassium: 4.1 mmol/L (ref 3.5–5.1)
Sodium: 139 mmol/L (ref 135–145)
Total Bilirubin: 0.3 mg/dL (ref 0.3–1.2)
Total Protein: 8 g/dL (ref 6.5–8.1)

## 2018-09-03 MED ORDER — SODIUM CHLORIDE (PF) 0.9 % IJ SOLN
INTRAMUSCULAR | Status: AC
  Filled 2018-09-03: qty 50

## 2018-09-03 MED ORDER — IOHEXOL 300 MG/ML  SOLN
75.0000 mL | Freq: Once | INTRAMUSCULAR | Status: AC | PRN
  Administered 2018-09-03: 11:00:00 75 mL via INTRAVENOUS

## 2018-09-08 ENCOUNTER — Inpatient Hospital Stay (HOSPITAL_BASED_OUTPATIENT_CLINIC_OR_DEPARTMENT_OTHER): Admitting: Internal Medicine

## 2018-09-08 ENCOUNTER — Telehealth: Payer: Self-pay | Admitting: Internal Medicine

## 2018-09-08 ENCOUNTER — Other Ambulatory Visit: Payer: Self-pay

## 2018-09-08 ENCOUNTER — Encounter: Payer: Self-pay | Admitting: Internal Medicine

## 2018-09-08 VITALS — BP 113/62 | HR 74 | Temp 97.5°F | Resp 18 | Ht 63.0 in | Wt 204.0 lb

## 2018-09-08 DIAGNOSIS — C3411 Malignant neoplasm of upper lobe, right bronchus or lung: Secondary | ICD-10-CM

## 2018-09-08 DIAGNOSIS — R0609 Other forms of dyspnea: Secondary | ICD-10-CM

## 2018-09-08 DIAGNOSIS — E032 Hypothyroidism due to medicaments and other exogenous substances: Secondary | ICD-10-CM

## 2018-09-08 DIAGNOSIS — Z9221 Personal history of antineoplastic chemotherapy: Secondary | ICD-10-CM

## 2018-09-08 DIAGNOSIS — Z5112 Encounter for antineoplastic immunotherapy: Secondary | ICD-10-CM | POA: Diagnosis not present

## 2018-09-08 DIAGNOSIS — Z923 Personal history of irradiation: Secondary | ICD-10-CM | POA: Diagnosis not present

## 2018-09-08 DIAGNOSIS — Z79899 Other long term (current) drug therapy: Secondary | ICD-10-CM

## 2018-09-08 DIAGNOSIS — I10 Essential (primary) hypertension: Secondary | ICD-10-CM

## 2018-09-08 DIAGNOSIS — Z87311 Personal history of (healed) other pathological fracture: Secondary | ICD-10-CM

## 2018-09-08 DIAGNOSIS — C3490 Malignant neoplasm of unspecified part of unspecified bronchus or lung: Secondary | ICD-10-CM

## 2018-09-08 DIAGNOSIS — E039 Hypothyroidism, unspecified: Secondary | ICD-10-CM

## 2018-09-08 NOTE — Progress Notes (Signed)
[place] Telephone:(336) [phone]   Fax:(336) [phone]  OFFICE PROGRESS NOTE  [name], MD [address] [place] [address]  DIAGNOSIS: stageIIIB (T3, N2, M0)non-small cell lung cancer, adenocarcinoma diagnosed in March 2019 and presented with large right upper lobe lung mass in addition to right hilar and mediastinal lymphadenopathy.  PD-L1 0%  PRIOR THERAPY:  1) Concurrent chemoradiation with weekly carboplatin for an AUC of 2 and paclitaxel 45 mg/m2.  First dose expected on 06/01/2017.  Status post 7 cycles.  Last dose was given 07/14/2017. 2)Consolidation treatment with immunotherapy with Imfinzi (Durvalumab) 10 mg/KG every 2 weeks status post 26 cycles.  CURRENT THERAPY:  Observation.  INTERVAL HISTORY: Ashley Wilson 63 y.o. female returns to the clinic today for follow-up visit.  The patient is feeling fine today with no concerning complaints.  She denied having any chest pain, shortness of breath, cough or hemoptysis.  She denied having any fever or chills.  She has no nausea, vomiting, diarrhea or constipation.  She has no headache or visual changes.  The patient denied having any significant weight loss or night sweats.  She tolerated the previous course of consolidation treatment with Imfinzi fairly well.  She had repeat CT scan of the chest performed recently and she is here for evaluation and discussion of her scan results.   MEDICAL HISTORY: Past Medical History:  Diagnosis Date   Compression fracture of lumbar vertebra ([place])    Hypertension    NSCL ca dx'd 05/2017    ALLERGIES:  is allergic to lisinopril-hydrochlorothiazide.  MEDICATIONS:  Current Outpatient Medications  Medication Sig Dispense Refill   acetaminophen (TYLENOL) 500 MG tablet Take 1,000 mg by mouth every 6 (six) hours as needed for mild pain or moderate pain.     amLODipine (NORVASC) 10 MG tablet Take 10 mg by mouth daily.     clotrimazole-betamethasone  (LOTRISONE) cream Apply 1 application topically 2 (two) times daily. 45 g 1   hydrochlorothiazide (HYDRODIURIL) 25 MG tablet Take 1 tablet (25 mg total) by mouth daily. 30 tablet 0   prochlorperazine (COMPAZINE) 5 MG tablet Take 1 tablet by mouth every 6 (six) hours as needed.  0   rosuvastatin (CRESTOR) 20 MG tablet Take 20 mg by mouth daily.     spironolactone (ALDACTONE) 25 MG tablet Take 25 mg by mouth daily.     No current facility-administered medications for this visit.     SURGICAL HISTORY:  Past Surgical History:  Procedure Laterality Date   BACK SURGERY  2009   ruptured disc    REVIEW OF SYSTEMS:  Constitutional: negative Eyes: negative Ears, nose, mouth, throat, and face: negative Respiratory: positive for dyspnea on exertion Cardiovascular: negative Gastrointestinal: negative Genitourinary:negative Integument/breast: negative Hematologic/lymphatic: negative Musculoskeletal:negative Neurological: negative Behavioral/Psych: negative Endocrine: negative Allergic/Immunologic: negative   PHYSICAL EXAMINATION: General appearance: alert, cooperative and no distress Head: Normocephalic, without obvious abnormality, atraumatic Neck: no adenopathy, no JVD, supple, symmetrical, trachea midline and thyroid not enlarged, symmetric, no tenderness/mass/nodules Lymph nodes: Cervical, supraclavicular, and axillary nodes normal. Resp: clear to auscultation bilaterally Back: symmetric, no curvature. ROM normal. No CVA tenderness. Cardio: regular rate and rhythm, S1, S2 normal, no murmur, click, rub or gallop GI: soft, non-tender; bowel sounds normal; no masses,  no organomegaly Extremities: extremities normal, atraumatic, no cyanosis or edema Neurologic: Alert and oriented X 3, normal strength and tone. Normal symmetric reflexes. Normal coordination and gait  ECOG PERFORMANCE STATUS: 0 - Asymptomatic  Blood pressure 113/62, pulse 74, temperature (!) 97.5 F (36.4 C),  temperature source Temporal, resp. rate 18, height 5' 3"  (1.6 m), weight 204 lb (92.5 kg), SpO2 99 %.  LABORATORY DATA: Lab Results  Component Value Date   WBC 8.1 09/03/2018   HGB 12.5 09/03/2018   HCT 38.6 09/03/2018   MCV 83.9 09/03/2018   PLT 263 09/03/2018      Chemistry      Component Value Date/Time   NA 139 09/03/2018 0946   K 4.1 09/03/2018 0946   CL 106 09/03/2018 0946   CO2 21 (L) 09/03/2018 0946   BUN 23 09/03/2018 0946   CREATININE 1.08 (H) 09/03/2018 0946      Component Value Date/Time   CALCIUM 9.1 09/03/2018 0946   ALKPHOS 124 09/03/2018 0946   AST 19 09/03/2018 0946   ALT 16 09/03/2018 0946   BILITOT 0.3 09/03/2018 0946       RADIOGRAPHIC STUDIES: Ct Chest W Contrast  Result Date: 09/03/2018 CLINICAL DATA:  Lung carcinoma.  Non-small cell lung cancer. EXAM: CT CHEST WITH CONTRAST TECHNIQUE: Multidetector CT imaging of the chest was performed during intravenous contrast administration. CONTRAST:  4m OMNIPAQUE IOHEXOL 300 MG/ML  SOLN COMPARISON:  CT 05/10/2018 FINDINGS: Cardiovascular: Coronary artery calcification and aortic atherosclerotic calcification. Mediastinum/Nodes: No axillary or supraclavicular adenopathy. No mediastinal hilar adenopathy. No pericardial effusion. Esophagus normal. Lungs/Pleura: Pleural base mass in the posterior aspect of the RIGHT upper lobe measures 4.1 by 2.2 cm compared with 4.2 x 2.6 cm for no significant change. Paraseptal emphysema more anterior in the RIGHT lung with peripheral fibrotic reticulation also unchanged. Band of atelectasis in RIGHT lower lobe. No new or suspicious pulmonary nodules. Upper Abdomen: Limited view of the liver, kidneys, pancreas are unremarkable. Normal adrenal glands. Benign appearing cyst anterior to the LEFT kidney. Musculoskeletal: No aggressive osseous lesion IMPRESSION: 1. Stable pleural base mass in the RIGHT upper lobe at treatment site. 2. No evidence of new or progressive lung carcinoma. 3.  Coronary artery calcification and Aortic Atherosclerosis (ICD10-I70.0). Electronically Signed   By: SSuzy BouchardM.D.   On: 09/03/2018 13:45    ASSESSMENT AND PLAN: This is a very pleasant 63years old [name] with stage IIIB non-small cell lung cancer, adenocarcinoma.   The patient underwent a course of concurrent chemoradiation with weekly carboplatin and paclitaxel status post 7 cycles.  She tolerated this course of treatment fairly well with no concerning complaints.   The patient completed treatment with consolidation immunotherapy with Imfinzi (Durvalumab) 10 mg/KG every 2 weeks status post 26 cycles. She tolerated her previous consolidation therapy fairly well. She had repeat CT scan of the chest performed recently.  I personally and independently reviewed the scans and discussed the results with the patient today. Her scan showed no concerning findings for disease progression. I recommended for her to continue on observation with repeat CT scan of the chest in 3 months. For the hypothyroidism, she will continue with the current levothyroxine dose and we will continue to monitor her TSH closely. The patient was advised to call immediately if she has any other concerning symptoms in the interval. The patient voices understanding of current disease status and treatment options and is in agreement with the current care plan. All questions were answered. The patient knows to call the clinic with any problems, questions or concerns. We can certainly see the patient much sooner if necessary.  Disclaimer: This note was dictated with voice recognition software. Similar sounding words can  inadvertently be transcribed and may not be corrected upon review.

## 2018-09-08 NOTE — Telephone Encounter (Signed)
Scheduled appt per 7/22 los - gave patient AVS and calender per los.

## 2018-12-10 ENCOUNTER — Ambulatory Visit (HOSPITAL_COMMUNITY)
Admission: RE | Admit: 2018-12-10 | Discharge: 2018-12-10 | Disposition: A | Discharge status: Home / Self Care | Payer: Medicare Other | Source: Ambulatory Visit | Attending: Internal Medicine | Admitting: Internal Medicine

## 2018-12-10 ENCOUNTER — Other Ambulatory Visit

## 2018-12-10 ENCOUNTER — Other Ambulatory Visit: Payer: Self-pay

## 2018-12-10 ENCOUNTER — Inpatient Hospital Stay: Payer: Medicare Other | Attending: Internal Medicine

## 2018-12-10 DIAGNOSIS — C3490 Malignant neoplasm of unspecified part of unspecified bronchus or lung: Secondary | ICD-10-CM | POA: Diagnosis not present

## 2018-12-10 DIAGNOSIS — Z923 Personal history of irradiation: Secondary | ICD-10-CM | POA: Diagnosis not present

## 2018-12-10 DIAGNOSIS — Z9221 Personal history of antineoplastic chemotherapy: Secondary | ICD-10-CM | POA: Diagnosis not present

## 2018-12-10 DIAGNOSIS — I10 Essential (primary) hypertension: Secondary | ICD-10-CM | POA: Insufficient documentation

## 2018-12-10 DIAGNOSIS — Z79899 Other long term (current) drug therapy: Secondary | ICD-10-CM | POA: Insufficient documentation

## 2018-12-10 DIAGNOSIS — E1165 Type 2 diabetes mellitus with hyperglycemia: Secondary | ICD-10-CM | POA: Diagnosis not present

## 2018-12-10 DIAGNOSIS — C3411 Malignant neoplasm of upper lobe, right bronchus or lung: Secondary | ICD-10-CM | POA: Diagnosis not present

## 2018-12-10 LAB — CBC WITH DIFFERENTIAL (CANCER CENTER ONLY)
Abs Immature Granulocytes: 0.06 10*3/uL (ref 0.00–0.07)
Basophils Absolute: 0.1 10*3/uL (ref 0.0–0.1)
Basophils Relative: 1 %
Eosinophils Absolute: 0.2 10*3/uL (ref 0.0–0.5)
Eosinophils Relative: 3 %
HCT: 40.8 % (ref 36.0–46.0)
Hemoglobin: 12.9 g/dL (ref 12.0–15.0)
Immature Granulocytes: 1 %
Lymphocytes Relative: 16 %
Lymphs Abs: 1.1 10*3/uL (ref 0.7–4.0)
MCH: 27.4 pg (ref 26.0–34.0)
MCHC: 31.6 g/dL (ref 30.0–36.0)
MCV: 86.6 fL (ref 80.0–100.0)
Monocytes Absolute: 0.5 10*3/uL (ref 0.1–1.0)
Monocytes Relative: 7 %
Neutro Abs: 5.2 10*3/uL (ref 1.7–7.7)
Neutrophils Relative %: 72 %
Platelet Count: 258 10*3/uL (ref 150–400)
RBC: 4.71 MIL/uL (ref 3.87–5.11)
RDW: 15.4 % (ref 11.5–15.5)
WBC Count: 7.2 10*3/uL (ref 4.0–10.5)
nRBC: 0 % (ref 0.0–0.2)

## 2018-12-10 LAB — CMP (CANCER CENTER ONLY)
ALT: 13 U/L (ref 0–44)
AST: 14 U/L — ABNORMAL LOW (ref 15–41)
Albumin: 3.4 g/dL — ABNORMAL LOW (ref 3.5–5.0)
Alkaline Phosphatase: 100 U/L (ref 38–126)
Anion gap: 12 (ref 5–15)
BUN: 21 mg/dL (ref 8–23)
CO2: 18 mmol/L — ABNORMAL LOW (ref 22–32)
Calcium: 9.4 mg/dL (ref 8.9–10.3)
Chloride: 112 mmol/L — ABNORMAL HIGH (ref 98–111)
Creatinine: 1.03 mg/dL — ABNORMAL HIGH (ref 0.44–1.00)
GFR, Est AFR Am: >60 mL/min (ref >60)
GFR, Estimated: 57 mL/min — ABNORMAL LOW (ref >60)
Glucose, Bld: 101 mg/dL — ABNORMAL HIGH (ref 70–99)
Potassium: 3.9 mmol/L (ref 3.5–5.1)
Sodium: 142 mmol/L (ref 135–145)
Total Bilirubin: 0.3 mg/dL (ref 0.3–1.2)
Total Protein: 7.6 g/dL (ref 6.5–8.1)

## 2018-12-10 MED ORDER — SODIUM CHLORIDE (PF) 0.9 % IJ SOLN
INTRAMUSCULAR | Status: AC
  Filled 2018-12-10: qty 50

## 2018-12-10 MED ORDER — IOHEXOL 300 MG/ML  SOLN
75.0000 mL | Freq: Once | INTRAMUSCULAR | Status: AC | PRN
  Administered 2018-12-10: 10:00:00 75 mL via INTRAVENOUS

## 2018-12-13 ENCOUNTER — Encounter: Payer: Self-pay | Admitting: Physician Assistant

## 2018-12-13 ENCOUNTER — Other Ambulatory Visit: Payer: Self-pay

## 2018-12-13 ENCOUNTER — Inpatient Hospital Stay (HOSPITAL_BASED_OUTPATIENT_CLINIC_OR_DEPARTMENT_OTHER): Payer: Medicare Other | Admitting: Physician Assistant

## 2018-12-13 VITALS — BP 125/80 | HR 77 | Temp 98.7°F | Resp 17 | Ht 63.0 in | Wt 212.4 lb

## 2018-12-13 DIAGNOSIS — C3411 Malignant neoplasm of upper lobe, right bronchus or lung: Secondary | ICD-10-CM

## 2018-12-13 DIAGNOSIS — I2510 Atherosclerotic heart disease of native coronary artery without angina pectoris: Secondary | ICD-10-CM | POA: Diagnosis not present

## 2018-12-13 DIAGNOSIS — I2584 Coronary atherosclerosis due to calcified coronary lesion: Secondary | ICD-10-CM | POA: Diagnosis not present

## 2018-12-13 NOTE — Progress Notes (Signed)
[facility] OFFICE PROGRESS NOTE  Ashley Wilson, Ashley Wilson [place] [address]  DIAGNOSIS: StageIIIB (T3, N2, M0)non-small cell lung cancer, adenocarcinoma diagnosed in March 2019 and presented with large right upper lobe lung mass in addition to right hilar and mediastinal lymphadenopathy.  PD-L10%  PRIOR THERAPY: 1) Concurrent chemoradiation with weekly carboplatin for an AUC of 2 and paclitaxel 45 mg/m2. First dose expected on 06/01/2017. Status post 7 cycles. Last dose was giving 07/14/2017 2) Consolidation treatment with immunotherapy with Imfinzi (Durvalumab) 10 mg/KG every 2 weeks status post 26cycles. Last dose 08/19/2018  CURRENT THERAPY: Observation  INTERVAL HISTORY: Ashley Wilson 63 y.o. female returns to the clinic for a follow up visit. The patient is feeling well today without any concerning complaints except for weight gain during COVID 19 pandemic due to being more sedentary. She has a stationary bike at home and is considering starting to increase her exercise routine. She has an appointment with her PCP on Thursday to review blood work. The patient had some questions about her recent medications with metformin and levothyroxine today.   Related to her oncologic history, she denies any concerning symptoms.  Denies any fever, chills, night sweats, or weight loss. Denies any chest pain, shortness of breath, cough, or hemoptysis. Denies any nausea, vomiting, diarrhea, or constipation. Denies any headache or visual changes. Denies any rashes or skin changes. The patient completed 26 cycles of consolidation immunotherapy. She has been on observation for 3 months. She recently had a restaging CT scan performed of the chest. The patient is here today for evaluation and to review her scan results.    MEDICAL HISTORY: Past Medical History:  Diagnosis Date  . Compression fracture of lumbar vertebra ([place])   . Hypertension   . NSCL ca dx'd 05/2017     ALLERGIES:  is allergic to lisinopril-hydrochlorothiazide.  MEDICATIONS:  Current Outpatient Medications  Medication Sig Dispense Refill  . acetaminophen (TYLENOL) 500 MG tablet Take 1,000 mg by mouth every 6 (six) hours as needed for mild pain or moderate pain.    Marland Kitchen amLODipine (NORVASC) 10 MG tablet Take 10 mg by mouth daily.    . clotrimazole-betamethasone (LOTRISONE) cream Apply 1 application topically 2 (two) times daily. 45 g 1  . hydrochlorothiazide (HYDRODIURIL) 25 MG tablet Take 1 tablet (25 mg total) by mouth daily. 30 tablet 0  . prochlorperazine (COMPAZINE) 5 MG tablet Take 1 tablet by mouth every 6 (six) hours as needed.  0  . rosuvastatin (CRESTOR) 20 MG tablet Take 20 mg by mouth daily.    Marland Kitchen spironolactone (ALDACTONE) 25 MG tablet Take 25 mg by mouth daily.     No current facility-administered medications for this visit.     SURGICAL HISTORY:  Past Surgical History:  Procedure Laterality Date  . BACK SURGERY  2009   ruptured disc    REVIEW OF SYSTEMS:   Review of Systems  Constitutional: Positive for weight gain. Negative for appetite change, chills, fatigue, and fever. HENT: Negative for mouth sores, nosebleeds, sore throat and trouble swallowing.   Eyes: Negative for eye problems and icterus.  Respiratory: Negative for cough, hemoptysis, shortness of breath and wheezing.   Cardiovascular: Negative for chest pain and leg swelling.  Gastrointestinal: Negative for abdominal pain, constipation, diarrhea, nausea and vomiting.  Genitourinary: Negative for bladder incontinence, difficulty urinating, dysuria, frequency and hematuria.   Musculoskeletal: Negative for back pain, gait problem, neck pain and neck stiffness.  Skin: Negative for  itching and rash.  Neurological: Negative for dizziness, extremity weakness, gait problem, headaches, light-headedness and seizures.  Hematological: Negative for adenopathy. Does not bruise/bleed easily.  Psychiatric/Behavioral:  Negative for confusion, depression and sleep disturbance. The patient is not nervous/anxious.     PHYSICAL EXAMINATION:  Blood pressure 125/80, pulse 77, temperature 98.7 F (37.1 C), temperature source Oral, resp. rate 17, height 5' 3"  (1.6 m), weight 212 lb 6.4 oz (96.3 kg), SpO2 100 %.  ECOG PERFORMANCE STATUS: 1 - Symptomatic but completely ambulatory  Physical Exam  Constitutional: Oriented to person, place, and time and well-developed, well-nourished, and in no distress.  HENT:  Head: Normocephalic and atraumatic.  Mouth/Throat: Oropharynx is clear and moist. No oropharyngeal exudate.  Eyes: Conjunctivae are normal. Right eye exhibits no discharge. Left eye exhibits no discharge. No scleral icterus.  Neck: Normal range of motion. Neck supple.  Cardiovascular: Normal rate, regular rhythm, normal heart sounds and intact distal pulses.   Pulmonary/Chest: Effort normal and breath sounds normal. No respiratory distress. No wheezes. No rales.  Abdominal: Soft. Bowel sounds are normal. Exhibits no distension and no mass. There is no tenderness.  Musculoskeletal: Normal range of motion. Exhibits no edema.  Lymphadenopathy:    No cervical adenopathy.  Neurological: Alert and oriented to person, place, and time. Exhibits normal muscle tone. Gait normal. Coordination normal.  Skin: Skin is warm and dry. No rash noted. Not diaphoretic. No erythema. No pallor.  Psychiatric: Mood, memory and judgment normal.  Vitals reviewed.  LABORATORY DATA: Lab Results  Component Value Date   WBC 7.2 12/10/2018   HGB 12.9 12/10/2018   HCT 40.8 12/10/2018   MCV 86.6 12/10/2018   PLT 258 12/10/2018      Chemistry      Component Value Date/Time   NA 142 12/10/2018 0907   K 3.9 12/10/2018 0907   CL 112 (H) 12/10/2018 0907   CO2 18 (L) 12/10/2018 0907   BUN 21 12/10/2018 0907   CREATININE 1.03 (H) 12/10/2018 0907      Component Value Date/Time   CALCIUM 9.4 12/10/2018 0907   ALKPHOS 100  12/10/2018 0907   AST 14 (L) 12/10/2018 0907   ALT 13 12/10/2018 0907   BILITOT 0.3 12/10/2018 0907       RADIOGRAPHIC STUDIES:  Ct Chest W Contrast  Result Date: 12/10/2018 CLINICAL DATA:  Restaging lung cancer EXAM: CT CHEST WITH CONTRAST TECHNIQUE: Multidetector CT imaging of the chest was performed during intravenous contrast administration. CONTRAST:  92m OMNIPAQUE IOHEXOL 300 MG/ML  SOLN COMPARISON:  09/03/2018 FINDINGS: Cardiovascular: The heart size appears within normal limits. Aortic atherosclerosis identified. Lad and RCA coronary artery calcifications. Mediastinum/Nodes: Normal appearance of the thyroid gland. The trachea appears patent and is midline. Small hiatal hernia noted. No mediastinal or hilar adenopathy. Lungs/Pleura: No pleural effusion. Emphysema. No airspace consolidation, atelectasis or pneumothorax. Radiation change within the right upper lobe is again noted. Within the posterior and lateral right upper lobe there is a large pleural base mass 3.0 x 4.3 cm, image 27/2. Previously 3.1 x 5.0 cm. Upper Abdomen: No acute abnormality. Complex cyst containing mural calcifications is again noted arising from upper pole of left kidney. Incompletely imaged and characterized. Musculoskeletal: Multiple right rib fractures are identified similar to previous exam. No acute or suspicious bone lesions IMPRESSION: No substantial change in pleural base mass within the right upper lobe with surrounding changes of external beam radiation. Chronic right rib deformities.  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 Electronically Signed  By: Kerby Moors M.D.   On: 12/10/2018 10:58     ASSESSMENT/PLAN:  This is a very pleasant 63 year old [name] with stage IIIb non-small cell lung cancer, adenocarcinoma.  She presented with a large left upper lobe mass in addition to right hilar and mediastinal lymphadenopathy.  She was diagnosed  in March 2019.  She completed 7 cycles of concurrent chemoradiation with weekly carboplatin for an AUC of 2 and paclitaxel 45 mg/m.  She then completed 26 cycles of consolidation immunotherapy.  She has been on observation for 3 months.  The patient recently had a restaging CT scan performed.  Dr. Julien Nordmann personally and independently reviewed the scan and discussed the results with the patient today.  The scan did not show any evidence of disease progression.  Dr. Julien Nordmann recommends that the patient continue on observation with repeat CT scan in 3 months.  We will see her back for follow-up visit in 3 months for evaluation and to review her scan results.  I spent some time counseling the patient on the importance of routine follow up and screening guidelines. The patient has hesitance with undergoing routine screenings and is reportedly overdue. The patient has a follow up visit with her primary physician Thursday. I highly encouraged the patient to perform her routine screenings and to take prescribed medications as written. I discussed side effects of uncontrolled diabetes and hypothyroidism. I also strongly encouraged the patient to increase her exercise routine. She will try to increase her activity with her station bike.   The patient was advised to call immediately if she has any concerning symptoms in the interval. The patient voices understanding of current disease status and treatment options and is in agreement with the current care plan. All questions were answered. The patient knows to call the clinic with any problems, questions or concerns. We can certainly see the patient much sooner if necessary  No orders of the defined types were placed in this encounter.    Treavon Castilleja L Mikhaela Zaugg, PA-C 12/13/18  ADDENDUM: Hematology/Oncology Attending: I had a face-to-face encounter with the patient today.  I recommended her care plan.  This is a very pleasant 63 years old  [name] with stage IIIb non-small cell lung cancer status post induction concurrent chemoradiation with weekly carboplatin and paclitaxel followed by consolidation treatment with Imfinzi every 2 weeks for a total of 26 cycles.  The patient has been on observation since her last treatment in July 2020. She is feeling fine today with no concerning complaints except for uncontrolled diabetes and also hypertension.  She was reluctant about taking her medications. She had repeat CT scan of the chest performed recently.  I personally and independently reviewed the scan images and discussed the result and showed the images to the patient today. Her scan showed no concerning findings for disease progression. I recommended for the patient to continue on observation with repeat CT scan of the chest in 3 months. She was advised to call immediately if she has any concerning symptoms in the interval.  Disclaimer: This note was dictated with voice recognition software. Similar sounding words can inadvertently be transcribed and may be missed upon review. Eilleen Kempf, MD 12/13/18

## 2018-12-14 ENCOUNTER — Telehealth: Payer: Self-pay | Admitting: Physician Assistant

## 2018-12-14 NOTE — Telephone Encounter (Signed)
Scheduled per los. Called and left msg. Mailed printout  °

## 2019-02-08 IMAGING — CT CT BIOPSY
1 of 3 series · 13 of 32 positions shown, 18 images · non-contrast
Comparison: none

INDICATION: 61-year-old female with a history of right upper lobe lung mass.

[Series 2: i-spiral 5.0 b40f · axial · 0.86mm/px · z∈[+1372,+1491]mm · 13 of 40 slices shown, 18 images]
[im 3/40  soft-tissue]
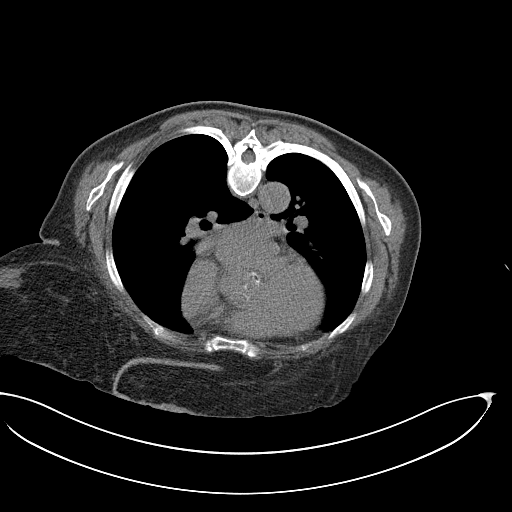
[im 3/40  bone]
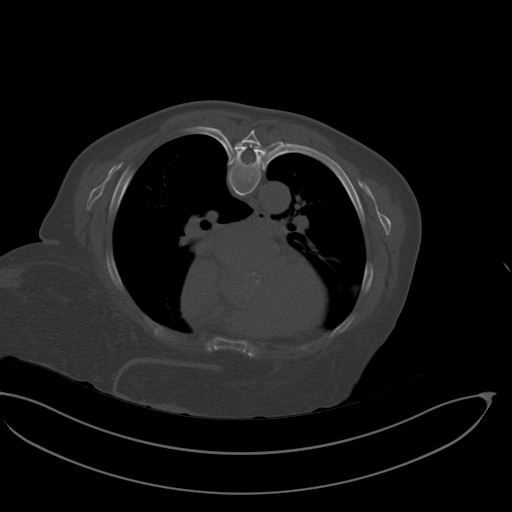
[im 7/40  soft-tissue]
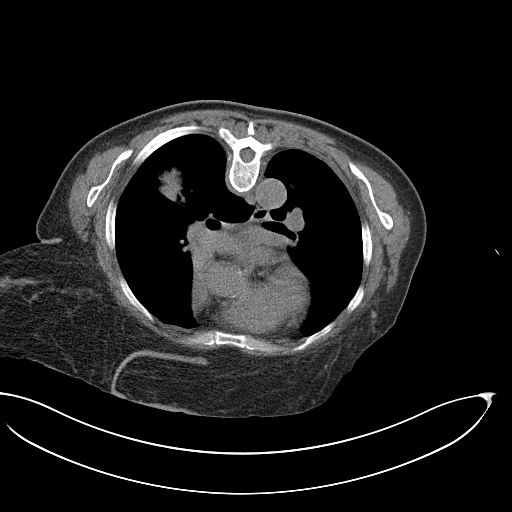
[im 9/40  soft-tissue]
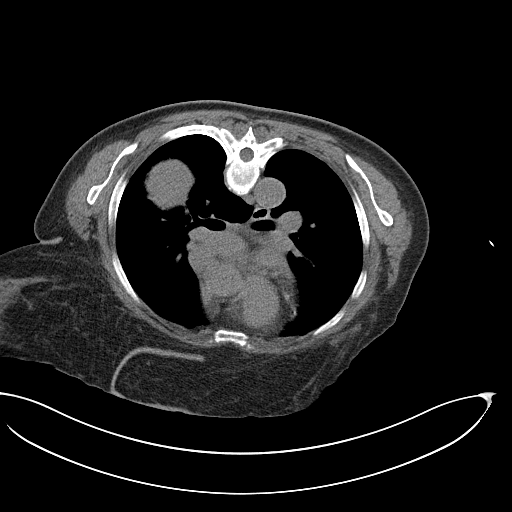
[im 11/40  soft-tissue]
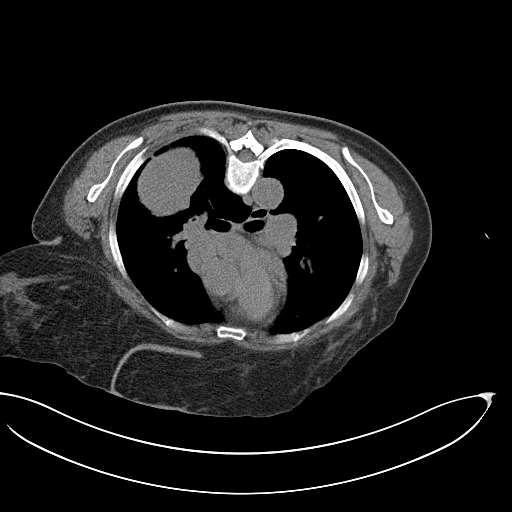
[im 16/40  soft-tissue]
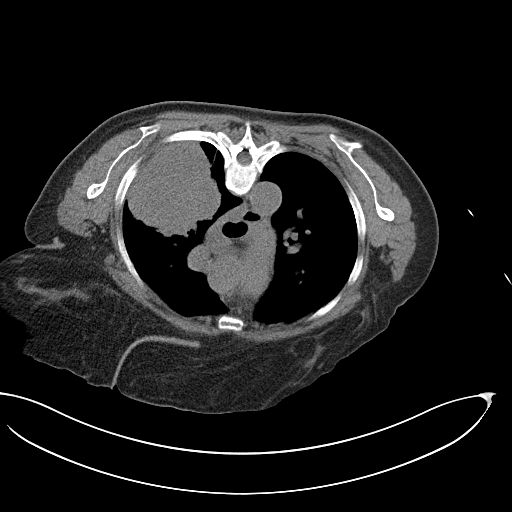
[im 18/40  soft-tissue]
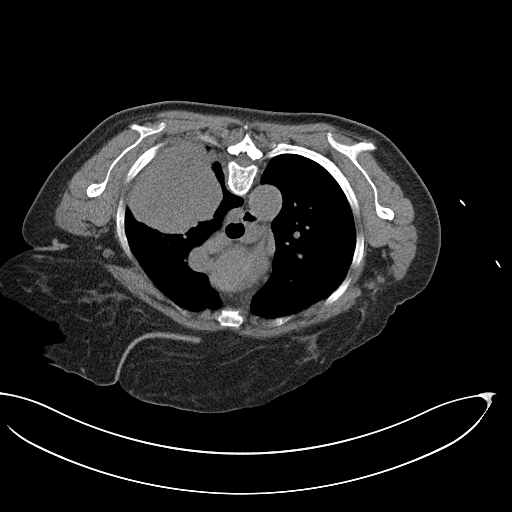
[im 22/40  soft-tissue]
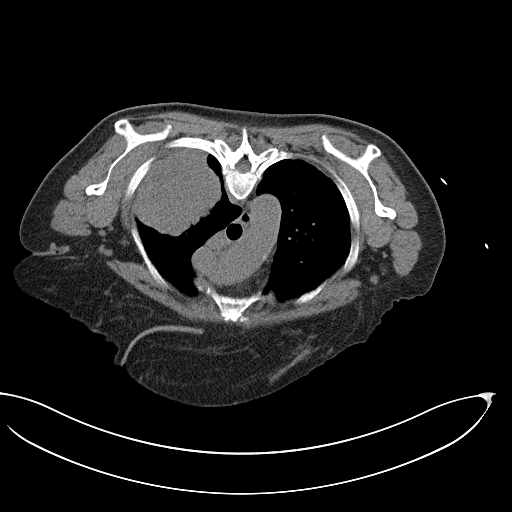
[im 24/40  soft-tissue]
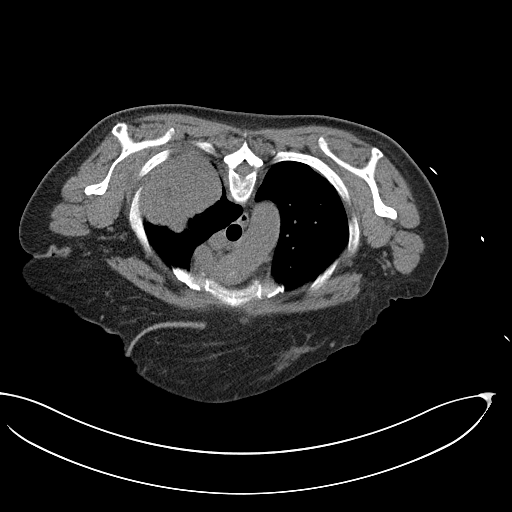
[im 29/40  soft-tissue]
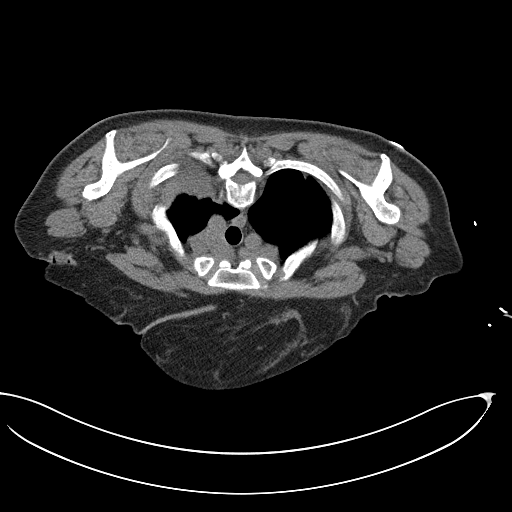
[im 29/40  bone]
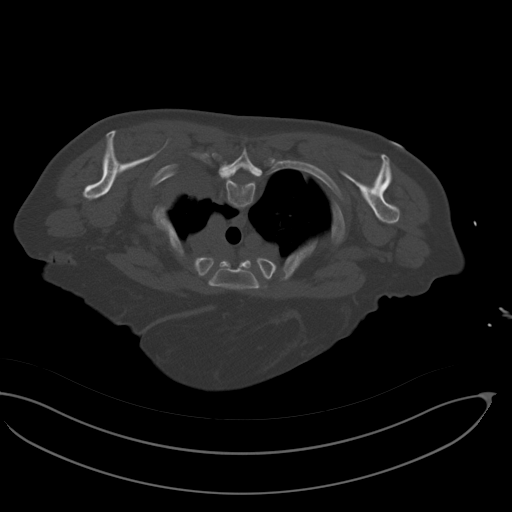
[im 31/40  soft-tissue]
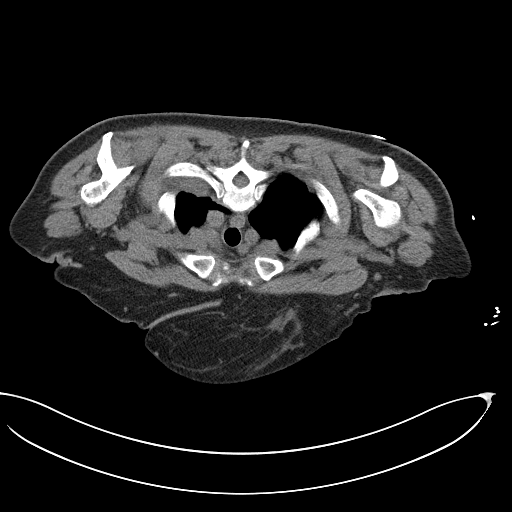
[im 31/40  lung]
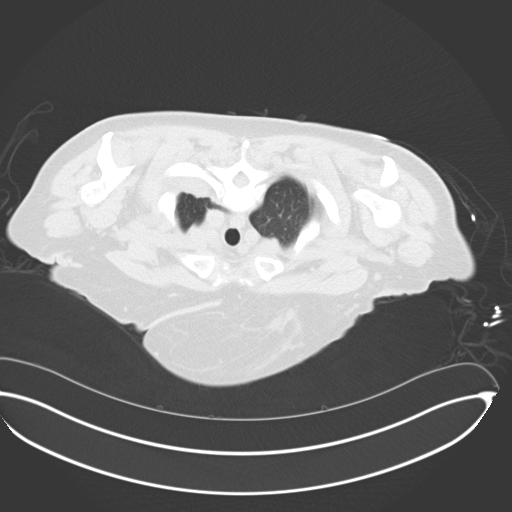
[im 33/40  soft-tissue]
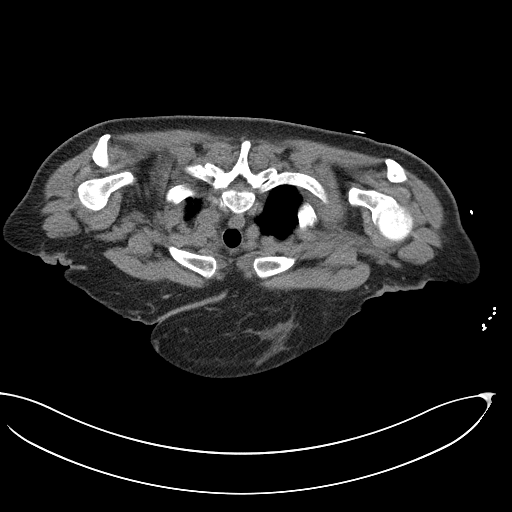
[im 33/40  lung]
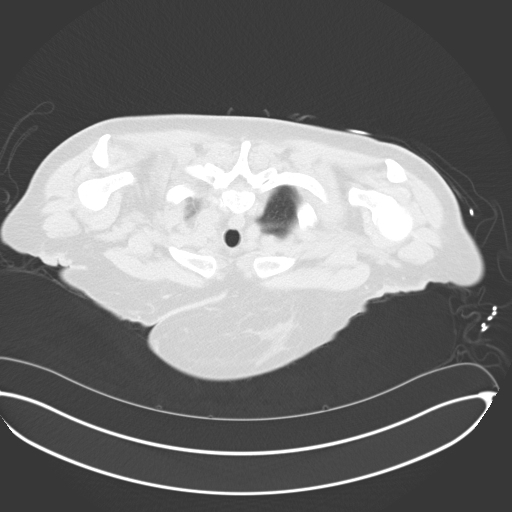
[im 35/40  lung]
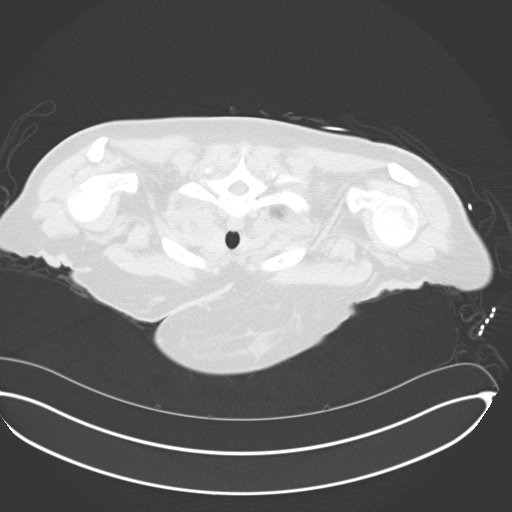
[im 37/40  soft-tissue]
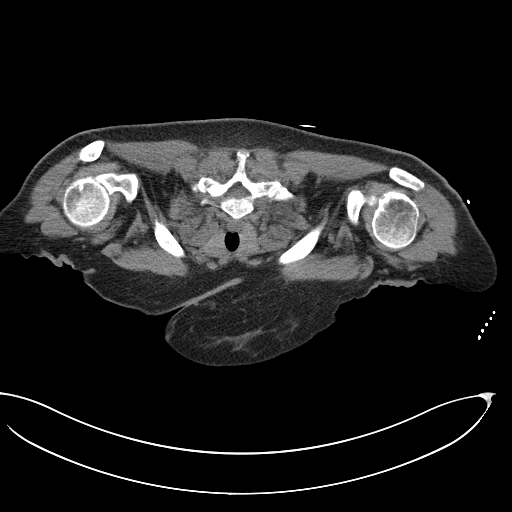
[im 37/40  lung]
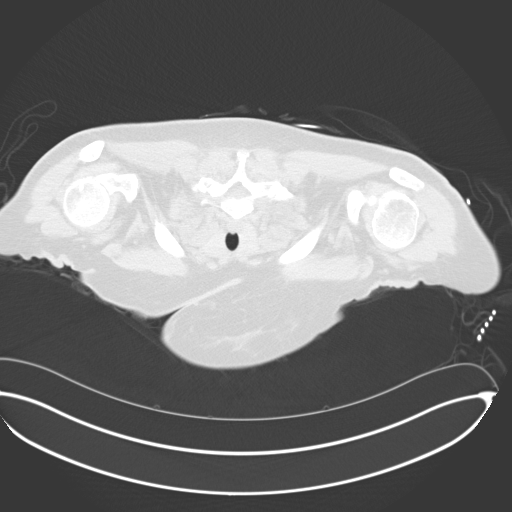

[13 of 32 positions shown; findings below may reference images not displayed]

EXAM:
CT BIOPSY

MEDICATIONS:
None.

ANESTHESIA/SEDATION:
Moderate (conscious) sedation was employed during this procedure. A
total of Versed 1.0 mg and Fentanyl 100 mcg was administered
intravenously.

Moderate Sedation Time: 10 minutes. The patient's level of
consciousness and vital signs were monitored continuously by
radiology nursing throughout the procedure under my direct
supervision.

FLUOROSCOPY TIME:  CT

COMPLICATIONS:
None

PROCEDURE:
The procedure, risks, benefits, and alternatives were explained to
the patient and the patient's family. Specific risks that were
addressed included bleeding, infection, pneumothorax, need for
further procedure including chest tube placement, chance of delayed
pneumothorax or hemorrhage, hemoptysis, nondiagnostic sample,
cardiopulmonary collapse, death. Questions regarding the procedure
were encouraged and answered. The patient understands and consents
to the procedure.

Patient was positioned in the prone position on the CT gantry table
and a scout CT of the chest was performed for planning purposes.

Once angle of approach was determined, the skin and subcutaneous
tissues this scan was prepped and draped in the usual sterile
fashion, and a sterile drape was applied covering the operative
field. A sterile gown and sterile gloves were used for the
procedure. Local anesthesia was provided with 1% Lidocaine.

The skin and subcutaneous tissues were infiltrated 1% lidocaine for
local anesthesia, and a small stab incision was made with an 11
blade scalpel.

Using CT guidance, a 17 gauge trocar needle was advanced into the
right upper lobetarget. After confirmation of the tip, separate 18
gauge core biopsies were performed. These were placed into solution
for transportation to the lab.

Biosentry Device was deployed. A final CT image was performed.

Patient tolerated the procedure well and remained hemodynamically
stable throughout.

No complications were encountered and no significant blood loss was
encounter
IMPRESSION: Status post CT-guided biopsy of right upper lobe mass. Tissue
specimen sent to pathology for complete histopathologic analysis.

## 2019-02-24 ENCOUNTER — Telehealth: Payer: Self-pay | Admitting: *Deleted

## 2019-02-24 NOTE — Telephone Encounter (Signed)
Received call from pt stating that she has been summoned to jury duty & she has labs & CT day before.  She feels that she physically can do this but is concerned about Covid & really doesn't want to do but understands she will need a letter from MD.  Calling to see what Dr Julien Nordmann thinks.  Message routed to him.

## 2019-03-03 IMAGING — CT NM PET TUM IMG INITIAL (PI) SKULL BASE T - THIGH
8 series · 25 of 25 positions shown · non-contrast
Comparison: 04/21/2017

CLINICAL DATA: Initial treatment strategy for non-small cell lung
cancer.

EXAM:
NUCLEAR MEDICINE PET SKULL BASE TO THIGH
TECHNIQUE: 8.0 mCi F-18 FDG was injected intravenously. Full-ring PET imaging
was performed from the skull base to thigh after the radiotracer. CT
data was obtained and used for attenuation correction and anatomic
localization.
Fasting blood glucose: 126 mg/dl

[Series 3: pet sk_thigh ac · axial · 5.0mm · 4.07mm/px · z∈[-962,-82]mm · 5 of 221 slices shown]
[im 1/221]
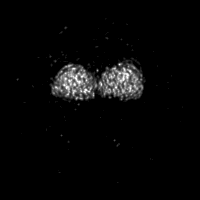
[im 56/221]
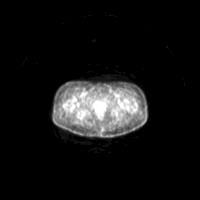
[im 111/221]
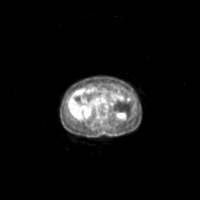
[im 166/221]
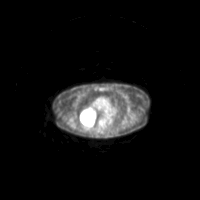
[im 221/221]
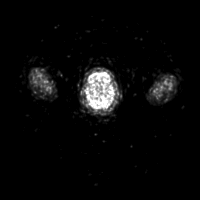

[Series 4: ct sk_thigh 5.0 b31f · axial · 5.0mm · 0.98mm/px · z∈[-962,-82]mm · 5 of 219 slices shown]
[im 1/219]
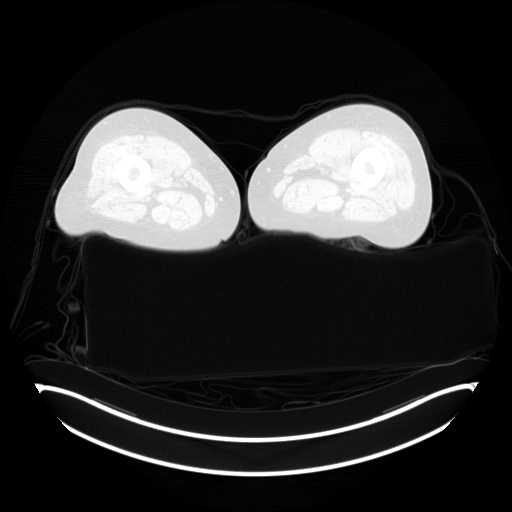
[im 55/219]
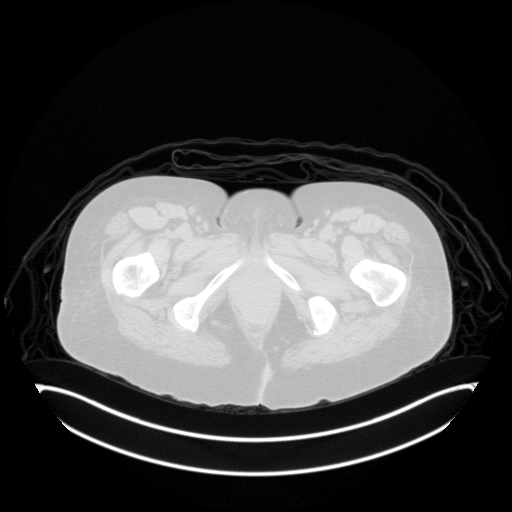
[im 110/219]
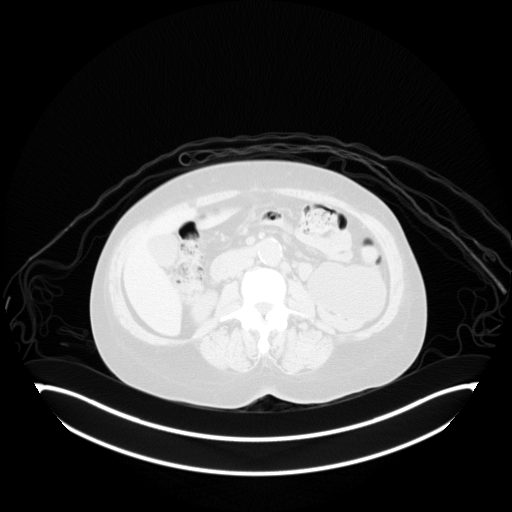
[im 164/219]
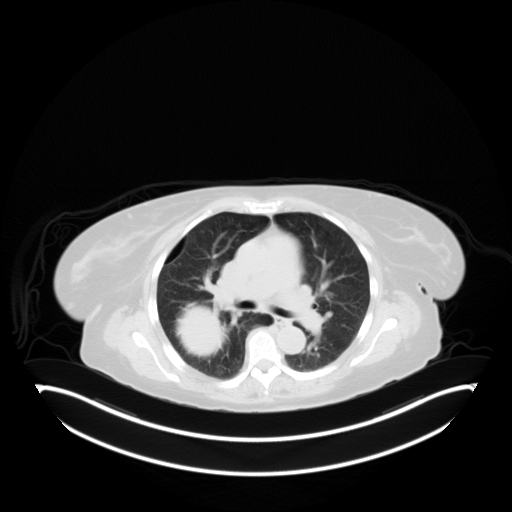
[im 219/219  brain]
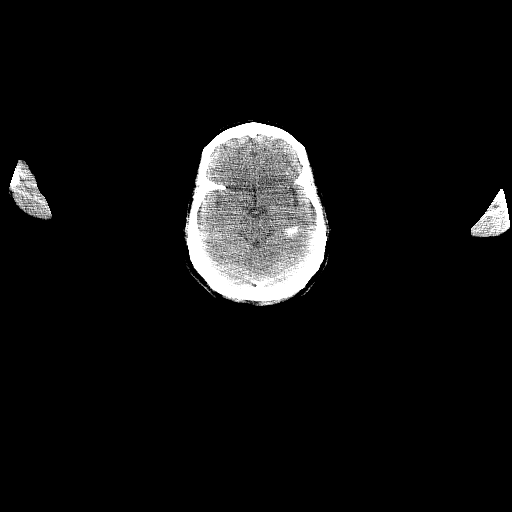

[Series 5: pet sk_thigh nac · axial · 5.0mm · 4.07mm/px · z∈[-962,-82]mm · 5 of 221 slices shown]
[im 1/221]
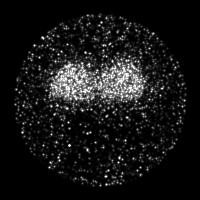
[im 56/221]
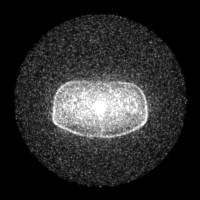
[im 111/221]
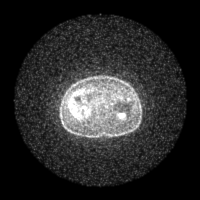
[im 166/221]
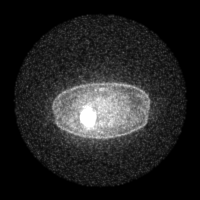
[im 221/221]
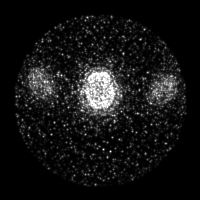

[Series 8: ct sk_thigh 5.0 (id) lung_bone · axial · 5.0mm · 0.60mm/px · 1 of 59 slices shown]
[im 1/59  bone]
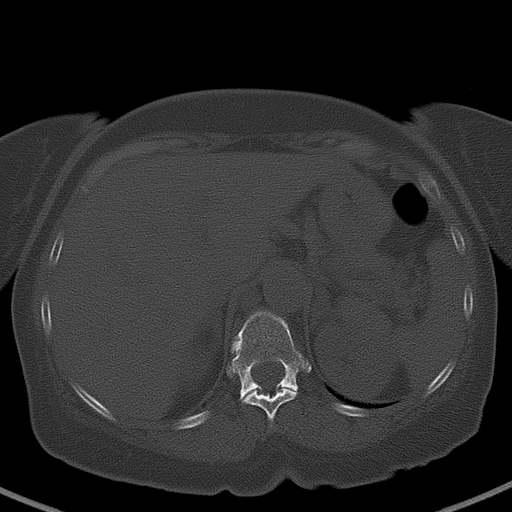

[Series 603: range-ct sk_thigh 5.0 (id)<alpha range> · 2 of 70 slices shown (1 of 2)]
[im 1/70]
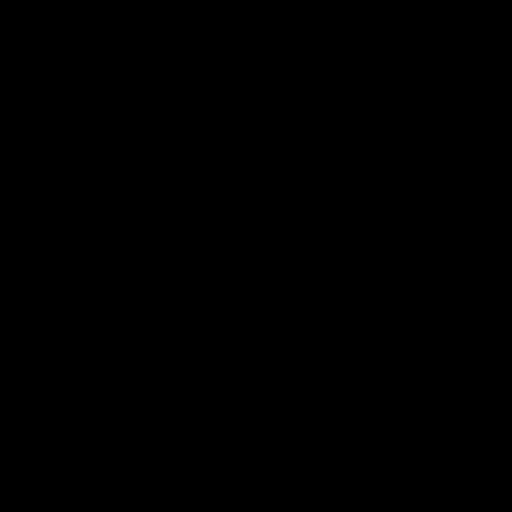
[im 70/70]
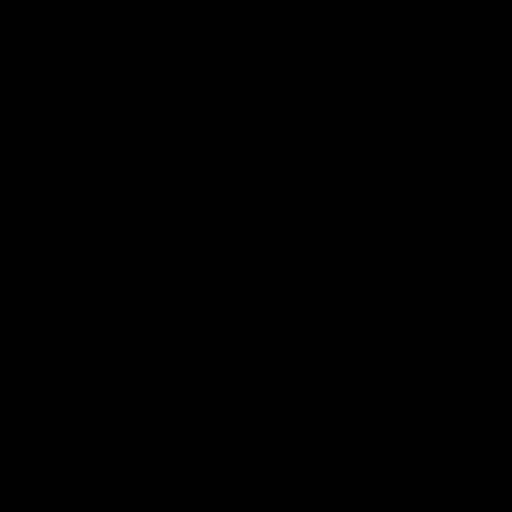

[Series 604: mip range 2 · coronal · 1.83mm/px · 1 of 32 slices shown]
[im 1/32]
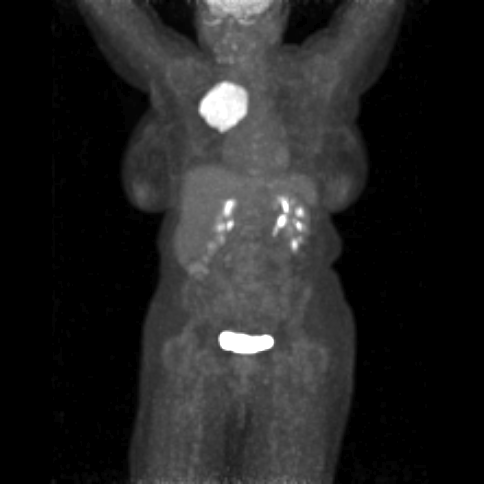

[Series 605: range-ct sk_thigh 5.0 (id)<alpha range> · 5 of 214 slices shown (2 of 2)]
[im 1/214]
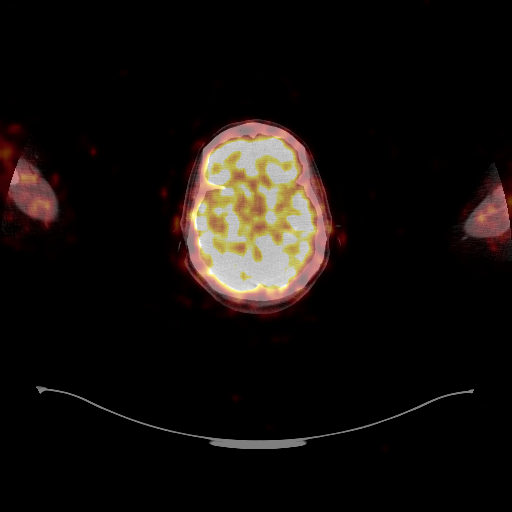
[im 54/214]
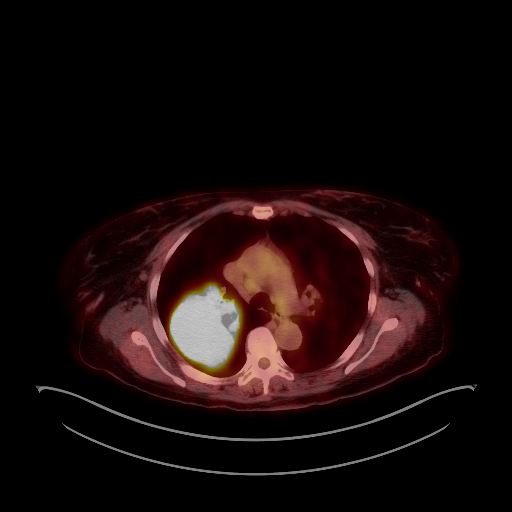
[im 107/214]
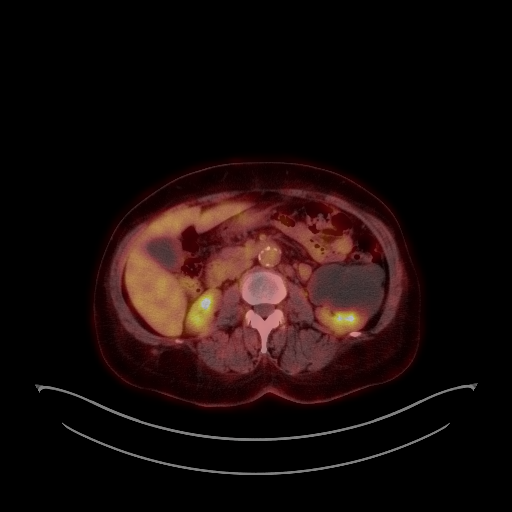
[im 160/214]
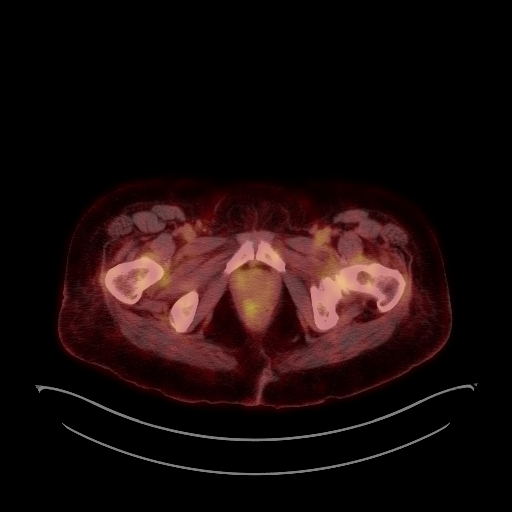
[im 214/214]
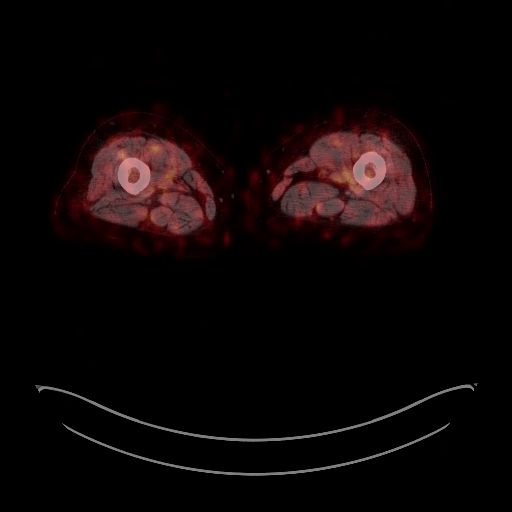

[Series 1078: results mm oncology reading · 5.0mm · 0.75mm/px · 1 of 8 slices shown]
[im 1/8]
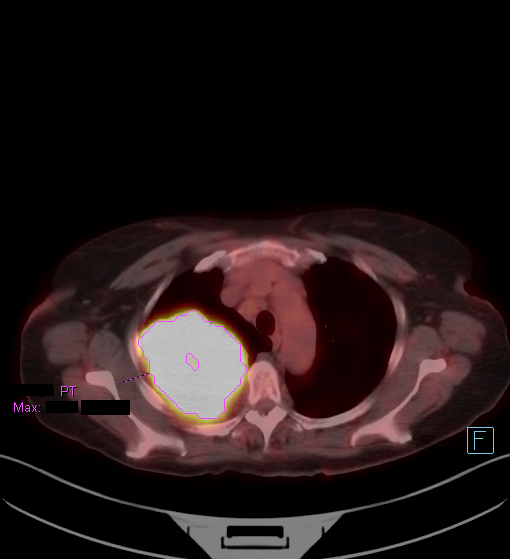

[25 of 25 positions shown; findings below may reference images not displayed]

FINDINGS: Mediastinal blood pool activity: SUV max

NECK: Low-grade palatine tonsillar activity with maximum SUV 4.9 on
the right and 2.8 on the left, but no corresponding CT abnormality.

Incidental CT findings: Small calcified left thyroid nodule without
hypermetabolic activity.

CHEST: [DATE] by 8.2 by 9.1 cm (volume = 370 cm^3) solid right upper
lobe mass, maximum SUV 19.3, this abuts a significant portion of the
pleura and lung apex but is without observed chest wall invasion.

Right paratracheal lymph node 1.3 cm in short axis on image 48/4
(formerly the same), maximum SUV 2.9. A lower right paratracheal
lymph node measuring 1.2 cm in short axis on image 53/4 has a
maximum SUV of 2.8.

Incidental CT findings: Coronary, aortic arch, and branch vessel
atherosclerotic vascular disease. Mild cardiomegaly. Peripheral
bulla anteriorly along the right upper lobe on image [DATE], stable.
Mild atelectasis in the lingula and dependently in both lower lobes.

Centrilobular emphysema.

ABDOMEN/PELVIS: Septated cystic lesion with faint calcifications
along the anterior margin left kidney appears generally photopenic,
and measures 7.3 by 5.3 by 11.3 cm in size. A right upper
retrocrural lymph node measuring 1.2 cm in short axis has a maximum
SUV of 2.0.

Incidental CT findings: Aortoiliac atherosclerotic vascular disease.
Anterior uterine fibroids are suspected. Sigmoid colon
diverticulosis.

SKELETON: No significant abnormal hypermetabolic skeletal activity.

Incidental CT findings: Suspected bilateral pars defects at L5.
IMPRESSION: 1. 9.4 cm solid right upper lobe mass with maximum SUV
compatible with malignancy. This has a significant area of abutment
with the pleura but no definite chest wall or rib invasion. Right
paratracheal lymph nodes are enlarged and have a maximum SUV mildly
above blood pool, probably reflecting early malignant spread. There
is an enlarged right retrocrural lymph node which is not currently
hypermetabolic.
2. No other findings of distant metastatic spread.
3. Asymmetry of low-grade palatine tonsillar activity is probably
incidental/physiologic.
4. There is a large septated cystic lesion of the left kidney with
some associated calcifications. Although this lesion appears
photopenic on PET-CT, it is clearly complex and warrants definitive
characterization. Renal protocol MRI with and without contrast is
recommended.
5. Other imaging findings of potential clinical significance: Aortic
Atherosclerosis ([id number].F). Coronary atherosclerosis with mild
cardiomegaly. Emphysema ([id number]). Anterior uterine fibroids.
Sigmoid colon diverticulosis. Probable pars defects at L5.

## 2019-03-15 ENCOUNTER — Other Ambulatory Visit: Payer: Self-pay

## 2019-03-15 ENCOUNTER — Inpatient Hospital Stay: Payer: Medicare Other | Attending: Internal Medicine

## 2019-03-15 ENCOUNTER — Ambulatory Visit (HOSPITAL_COMMUNITY)
Admission: RE | Admit: 2019-03-15 | Discharge: 2019-03-15 | Disposition: A | Discharge status: Home / Self Care | Payer: Medicare Other | Source: Ambulatory Visit | Attending: Physician Assistant | Admitting: Physician Assistant

## 2019-03-15 DIAGNOSIS — I10 Essential (primary) hypertension: Secondary | ICD-10-CM | POA: Diagnosis not present

## 2019-03-15 DIAGNOSIS — E119 Type 2 diabetes mellitus without complications: Secondary | ICD-10-CM | POA: Diagnosis not present

## 2019-03-15 DIAGNOSIS — E032 Hypothyroidism due to medicaments and other exogenous substances: Secondary | ICD-10-CM | POA: Insufficient documentation

## 2019-03-15 DIAGNOSIS — C3411 Malignant neoplasm of upper lobe, right bronchus or lung: Secondary | ICD-10-CM | POA: Insufficient documentation

## 2019-03-15 DIAGNOSIS — Z79899 Other long term (current) drug therapy: Secondary | ICD-10-CM | POA: Insufficient documentation

## 2019-03-15 DIAGNOSIS — J984 Other disorders of lung: Secondary | ICD-10-CM | POA: Diagnosis not present

## 2019-03-15 LAB — CBC WITH DIFFERENTIAL (CANCER CENTER ONLY)
Abs Immature Granulocytes: 0.04 10*3/uL (ref 0.00–0.07)
Basophils Absolute: 0.1 10*3/uL (ref 0.0–0.1)
Basophils Relative: 1 %
Eosinophils Absolute: 0.2 10*3/uL (ref 0.0–0.5)
Eosinophils Relative: 2 %
HCT: 38.4 % (ref 36.0–46.0)
Hemoglobin: 12.1 g/dL (ref 12.0–15.0)
Immature Granulocytes: 1 %
Lymphocytes Relative: 17 %
Lymphs Abs: 1.4 10*3/uL (ref 0.7–4.0)
MCH: 26.4 pg (ref 26.0–34.0)
MCHC: 31.5 g/dL (ref 30.0–36.0)
MCV: 83.8 fL (ref 80.0–100.0)
Monocytes Absolute: 0.8 10*3/uL (ref 0.1–1.0)
Monocytes Relative: 10 %
Neutro Abs: 5.4 10*3/uL (ref 1.7–7.7)
Neutrophils Relative %: 69 %
Platelet Count: 283 10*3/uL (ref 150–400)
RBC: 4.58 MIL/uL (ref 3.87–5.11)
RDW: 15.1 % (ref 11.5–15.5)
WBC Count: 7.8 10*3/uL (ref 4.0–10.5)
nRBC: 0 % (ref 0.0–0.2)

## 2019-03-15 LAB — CMP (CANCER CENTER ONLY)
ALT: 14 U/L (ref 0–44)
AST: 15 U/L (ref 15–41)
Albumin: 3.8 g/dL (ref 3.5–5.0)
Alkaline Phosphatase: 114 U/L (ref 38–126)
Anion gap: 11 (ref 5–15)
BUN: 22 mg/dL (ref 8–23)
CO2: 21 mmol/L — ABNORMAL LOW (ref 22–32)
Calcium: 9.2 mg/dL (ref 8.9–10.3)
Chloride: 107 mmol/L (ref 98–111)
Creatinine: 1.05 mg/dL — ABNORMAL HIGH (ref 0.44–1.00)
GFR, Est AFR Am: >60 mL/min (ref >60)
GFR, Estimated: 56 mL/min — ABNORMAL LOW (ref >60)
Glucose, Bld: 99 mg/dL (ref 70–99)
Potassium: 4.3 mmol/L (ref 3.5–5.1)
Sodium: 139 mmol/L (ref 135–145)
Total Bilirubin: 0.3 mg/dL (ref 0.3–1.2)
Total Protein: 7.9 g/dL (ref 6.5–8.1)

## 2019-03-15 MED ORDER — SODIUM CHLORIDE (PF) 0.9 % IJ SOLN
INTRAMUSCULAR | Status: AC
  Filled 2019-03-15: qty 50

## 2019-03-15 MED ORDER — IOHEXOL 300 MG/ML  SOLN
75.0000 mL | Freq: Once | INTRAMUSCULAR | Status: AC | PRN
  Administered 2019-03-15: 11:00:00 75 mL via INTRAVENOUS

## 2019-03-17 ENCOUNTER — Encounter: Payer: Self-pay | Admitting: Internal Medicine

## 2019-03-17 ENCOUNTER — Other Ambulatory Visit: Payer: Self-pay

## 2019-03-17 ENCOUNTER — Inpatient Hospital Stay (HOSPITAL_BASED_OUTPATIENT_CLINIC_OR_DEPARTMENT_OTHER): Payer: Medicare Other | Admitting: Internal Medicine

## 2019-03-17 VITALS — BP 131/62 | HR 79 | Temp 97.3°F | Resp 18 | Ht 63.0 in | Wt 209.0 lb

## 2019-03-17 DIAGNOSIS — C3411 Malignant neoplasm of upper lobe, right bronchus or lung: Secondary | ICD-10-CM

## 2019-03-17 DIAGNOSIS — C3490 Malignant neoplasm of unspecified part of unspecified bronchus or lung: Secondary | ICD-10-CM | POA: Diagnosis not present

## 2019-03-17 DIAGNOSIS — E032 Hypothyroidism due to medicaments and other exogenous substances: Secondary | ICD-10-CM

## 2019-03-17 NOTE — Progress Notes (Signed)
[facility] [phone]   [phone]) [phone]  OFFICE PROGRESS NOTE  [name], MD [address] [place] [address]  DIAGNOSIS: stageIIIB (T3, N2, M0)non-small cell lung cancer, adenocarcinoma diagnosed in March 2019 and presented with large right upper lobe lung mass in addition to right hilar and mediastinal lymphadenopathy.  PD-L1 0%  PRIOR THERAPY:  1) Concurrent chemoradiation with weekly carboplatin for an AUC of 2 and paclitaxel 45 mg/m2.  First dose expected on 06/01/2017.  Status post 7 cycles.  Last dose was given 07/14/2017. 2)Consolidation treatment with immunotherapy with Imfinzi (Durvalumab) 10 mg/KG every 2 weeks status post 26 cycles.  CURRENT THERAPY:  Observation.  INTERVAL HISTORY: Ashley Wilson 64 y.o. female returns to the clinic today for follow-up visit.  The patient is feeling fine today with no concerning complaints.  She denied having any current chest pain, shortness of breath, cough or hemoptysis.  She denied having any fever or chills.  She has no nausea, vomiting, diarrhea or constipation.  She has no headache or visual changes.  She was recently diagnosed with diabetes mellitus and was a started by her primary care physician on Metformin but the patient declined to take it.  Her primary care physician changed her medication and the patient is expectied to start it soon.  She is here today for evaluation with repeat CT scan of the chest for restaging of her disease.   MEDICAL HISTORY: Past Medical History:  Diagnosis Date  . Compression fracture of lumbar vertebra ([place])   . Hypertension   . NSCL ca dx'd 05/2017    ALLERGIES:  is allergic to lisinopril-hydrochlorothiazide.  MEDICATIONS:  Current Outpatient Medications  Medication Sig Dispense Refill  . acetaminophen (TYLENOL) 500 MG tablet Take 1,000 mg by mouth every 6 (six) hours as needed for mild pain or moderate pain.    Marland Kitchen amLODipine (NORVASC) 10 MG  tablet Take 10 mg by mouth daily.    . clotrimazole-betamethasone (LOTRISONE) cream Apply 1 application topically 2 (two) times daily. 45 g 1  . hydrochlorothiazide (HYDRODIURIL) 25 MG tablet Take 1 tablet (25 mg total) by mouth daily. 30 tablet 0  . prochlorperazine (COMPAZINE) 5 MG tablet Take 1 tablet by mouth every 6 (six) hours as needed.  0  . rosuvastatin (CRESTOR) 20 MG tablet Take 20 mg by mouth daily.    Marland Kitchen spironolactone (ALDACTONE) 25 MG tablet Take 25 mg by mouth daily.     No current facility-administered medications for this visit.    SURGICAL HISTORY:  Past Surgical History:  Procedure Laterality Date  . BACK SURGERY  2009   ruptured disc    REVIEW OF SYSTEMS:  A comprehensive review of systems was negative.   PHYSICAL EXAMINATION: General appearance: alert, cooperative and no distress Head: Normocephalic, without obvious abnormality, atraumatic Neck: no adenopathy, no JVD, supple, symmetrical, trachea midline and thyroid not enlarged, symmetric, no tenderness/mass/nodules Lymph nodes: Cervical, supraclavicular, and axillary nodes normal. Resp: clear to auscultation bilaterally Back: symmetric, no curvature. ROM normal. No CVA tenderness. Cardio: regular rate and rhythm, S1, S2 normal, no murmur, click, rub or gallop GI: soft, non-tender; bowel sounds normal; no masses,  no organomegaly Extremities: extremities normal, atraumatic, no cyanosis or edema  ECOG PERFORMANCE STATUS: 0 - Asymptomatic  Blood pressure 131/62, pulse 79, temperature (!) 97.3 F (36.3 C), temperature source Oral, resp. rate 18, height 5' 3"  (1.6 m), weight 209 lb (94.8 kg), SpO2 98 %.  LABORATORY DATA: Lab Results  Component Value Date   WBC 7.8 03/15/2019   HGB 12.1 03/15/2019   HCT 38.4 03/15/2019   MCV 83.8 03/15/2019   PLT 283 03/15/2019      Chemistry      Component Value Date/Time   NA 139 03/15/2019 0944   K 4.3 03/15/2019 0944   CL 107 03/15/2019 0944   CO2 21 (L)  03/15/2019 0944   BUN 22 03/15/2019 0944   CREATININE 1.05 (H) 03/15/2019 0944      Component Value Date/Time   CALCIUM 9.2 03/15/2019 0944   ALKPHOS 114 03/15/2019 0944   AST 15 03/15/2019 0944   ALT 14 03/15/2019 0944   BILITOT 0.3 03/15/2019 0944       RADIOGRAPHIC STUDIES: CT Chest W Contrast  Result Date: 03/15/2019 CLINICAL DATA:  Restaging lung cancer EXAM: CT CHEST WITH CONTRAST TECHNIQUE: Multidetector CT imaging of the chest was performed during intravenous contrast administration. CONTRAST:  54m OMNIPAQUE IOHEXOL 300 MG/ML  SOLN COMPARISON:  12/10/2018 FINDINGS: Cardiovascular: Coronary, aortic arch, and branch vessel atherosclerotic vascular disease. Mediastinum/Nodes: No thoracic pathologic adenopathy is identified. Lungs/Pleura: Stable right apical density and stable pleural-based right upper lobe density laterally, measuring approximately 4.8 by 3.6 cm. No change in the confluent anterior density in the right lung apex adjacent to a bulla. There is bandlike density in the right lower lobe likewise unchanged and probably due to prior therapy. No new or enlarging lesions are observed. Upper Abdomen: Stable nonspecific 0.6 by 0.4 cm lesion anteriorly in the spleen on image 111/2. We partially image a cystic lesion presumably arising from the left kidney with faint calcification along its margins, probably a complex cysts but only incompletely seen on today's exam. A gastrohepatic ligament lymph node measures 1.1 cm in short axis on image 114/2, previously the same. Musculoskeletal: Thoracic spondylosis. Healing fractures of the right third, fourth, fifth, and sixth ribs posterolaterally. IMPRESSION: 1. The 4.8 by 3.6 cm pleural-based right upper lobe density laterally is unchanged from prior and may primarily reflect radiation therapy related findings. No worrisome progression to suggest active malignancy. Nuclear medicine PET-CT may be a useful adjunct method of assessing this density.  2. Healing fractures of the right third, fourth, fifth, and sixth ribs posterolaterally. 3. Coronary, aortic arch, and branch vessel atherosclerotic vascular disease. Aortic Atherosclerosis (ICD10-I70.0). 4. Mildly enlarged gastrohepatic ligament/right gastric lymph node, stable. Electronically Signed   By: WVan ClinesM.D.   On: 03/15/2019 13:21    ASSESSMENT AND PLAN: This is a very pleasant 64years old [name] with stage IIIB non-small cell lung cancer, adenocarcinoma.   The patient underwent a course of concurrent chemoradiation with weekly carboplatin and paclitaxel status post 7 cycles.  She tolerated this course of treatment fairly well with no concerning complaints.   The patient completed treatment with consolidation immunotherapy with Imfinzi (Durvalumab) 10 mg/KG every 2 weeks status post 26 cycles. The patient tolerated her previous treatment fairly well with no concerning adverse effects. She is currently on observation and she is feeling fine. She had repeat CT scan of the chest performed recently.  I personally and independently reviewed the scans and discussed the results with the patient today. Her scan showed no concerning findings for disease progression. I recommended for the patient to continue on observation with repeat CT scan of the chest in 3 months. I will also arrange for the patient to have a survivorship visit with the physician assistant in 1 months. For the  hypothyroidism, she will continue her current treatment with levothyroxine. The patient was advised to call immediately if she has any concerning symptoms in the interval. The patient voices understanding of current disease status and treatment options and is in agreement with the current care plan. All questions were answered. The patient knows to call the clinic with any problems, questions or concerns. We can certainly see the patient much sooner if necessary.  Disclaimer: This note was  dictated with voice recognition software. Similar sounding words can inadvertently be transcribed and may not be corrected upon review.

## 2019-03-22 ENCOUNTER — Telehealth: Payer: Self-pay | Admitting: Internal Medicine

## 2019-03-22 NOTE — Telephone Encounter (Signed)
Scheduled per los. Called, bad connection with phone. Called back to leave message. Mailed printout

## 2019-04-14 NOTE — Progress Notes (Signed)
CLINIC:  Survivorship   REASON FOR VISIT:  Long-term survivorship surveillance visit for patient with history of lung cancer.    BRIEF ONCOLOGIC HISTORY:    Oncology History  Malignant neoplasm of bronchus of right upper lobe ([place])  05/22/2017 Initial Diagnosis   Malignant neoplasm of unspecified part of unspecified bronchus or lung ([place])   06/02/2017 - 07/20/2017 Chemotherapy   The patient had palonosetron (ALOXI) injection 0.25 mg, 0.25 mg, Intravenous,  Once, 7 of 7 cycles Administration: 0.25 mg (06/02/2017), 0.25 mg (06/29/2017), 0.25 mg (07/06/2017), 0.25 mg (06/08/2017), 0.25 mg (07/14/2017), 0.25 mg (06/15/2017), 0.25 mg (06/22/2017) CARBOplatin (PARAPLATIN) 210 mg in sodium chloride 0.9 % 250 mL chemo infusion, 210 mg (100 % of original dose 210.8 mg), Intravenous,  Once, 7 of 7 cycles Dose modification: 210.8 mg (original dose 210.8 mg, Cycle 1) Administration: 210 mg (06/02/2017), 210 mg (06/29/2017), 210 mg (07/06/2017), 210 mg (06/08/2017), 210 mg (07/14/2017), 210 mg (06/15/2017), 210 mg (06/22/2017) PACLitaxel (TAXOL) 84 mg in sodium chloride 0.9 % 250 mL chemo infusion (</= 80mg /m2), 45 mg/m2 = 84 mg, Intravenous,  Once, 7 of 7 cycles Administration: 84 mg (06/02/2017), 84 mg (06/29/2017), 84 mg (07/06/2017), 84 mg (06/08/2017), 84 mg (07/14/2017), 84 mg (06/15/2017), 84 mg (06/22/2017)  for chemotherapy treatment.    09/03/2017 -  Chemotherapy   The patient had durvalumab (IMFINZI) 740 mg in sodium chloride 0.9 % 100 mL chemo infusion, 10 mg/kg = 740 mg, Intravenous,  Once, 26 of 26 cycles Administration: 740 mg (09/03/2017), 740 mg (09/17/2017), 740 mg (10/01/2017), 740 mg (10/15/2017), 740 mg (10/29/2017), 740 mg (11/25/2017), 740 mg (12/10/2017), 740 mg (12/23/2017), 740 mg (11/11/2017), 740 mg (01/06/2018), 740 mg (01/20/2018), 860 mg (02/03/2018), 860 mg (02/18/2018), 860 mg (03/03/2018), 860 mg (03/18/2018), 860 mg (03/31/2018), 860 mg (04/14/2018), 860 mg (04/28/2018), 860 mg (05/12/2018), 860 mg (05/26/2018),  860 mg (06/10/2018), 860 mg (06/23/2018), 860 mg (07/08/2018), 860 mg (07/22/2018), 860 mg (08/05/2018), 860 mg (08/19/2018)  for chemotherapy treatment.        INTERVAL HISTORY:  Ashley Wilson is here today to review her survivorship care plan detailing her treatment course for lung cancer, as well as monitoring long-term side effects of that treatment, education regarding health maintenance, screening, and overall wellness and health promotion.   The patient is feeling well today. She denies any concerning complaints today. She denies any fever, chills, night sweats, or weight loss.  Denies any chest pain, shortness of breath, cough, or hemoptysis.  She denies any headaches or visual changes.  She denies any nausea, vomiting, diarrhea, or constipation.  She denies any rashes or skin changes. She quit smoking in 2019 when she was diagnosed with cancer. She is enjoying her free time with her grandchildren. She expresses that she wants to be healthier so she can be around for her grandchildren. She states she tries to eat healthy. She has gained weight over the course the the last few months secondary to being more sedentary due to the COVID-19 pandemic.    REVIEW OF SYSTEMS:   Review of Systems  Constitutional: Positive for weight gain. Negative for appetite change, chills, fatigue, and fever. HENT: Negative for mouth sores, nosebleeds, sore throat and trouble swallowing.   Eyes: Negative for eye problems and icterus.  Respiratory: Negative for cough, hemoptysis, shortness of breath and wheezing.   Cardiovascular: Negative for chest pain and leg swelling.  Gastrointestinal: Negative for abdominal pain, constipation, diarrhea, nausea and vomiting.  Genitourinary: Negative for bladder incontinence, difficulty  urinating, dysuria, frequency and hematuria.   Musculoskeletal: Negative for back pain, gait problem, neck pain and neck stiffness.  Skin: Negative for itching and rash.  Neurological: Negative for  dizziness, extremity weakness, gait problem, headaches, light-headedness and seizures.  Hematological: Negative for adenopathy. Does not bruise/bleed easily.  Psychiatric/Behavioral: Negative for confusion, depression and sleep disturbance. The patient is not nervous/anxious.    Physical Exam  Constitutional: Oriented to person, place, and time and well-developed, well-nourished, and in no distress.  HENT:  Head: Normocephalic and atraumatic.  Mouth/Throat: Oropharynx is clear and moist. No oropharyngeal exudate.  Eyes: Conjunctivae are normal. Right eye exhibits no discharge. Left eye exhibits no discharge. No scleral icterus.  Neck: Normal range of motion. Neck supple.  Cardiovascular: Normal rate, regular rhythm, normal heart sounds and intact distal pulses.   Pulmonary/Chest: Effort normal and breath sounds normal. No respiratory distress. No wheezes. No rales.  Abdominal: Soft. Bowel sounds are normal. Exhibits no distension and no mass. There is no tenderness.  Musculoskeletal: Normal range of motion. Exhibits no edema.  Lymphadenopathy:    No cervical adenopathy.  Neurological: Alert and oriented to person, place, and time. Exhibits normal muscle tone. Gait normal. Coordination normal.  Skin: Skin is warm and dry. No rash noted. Not diaphoretic. No erythema. No pallor.  Psychiatric: Mood, memory and judgment normal.  Vitals reviewed.   PAST MEDICAL & SURGICAL HISTORY:    Past Medical History:  Diagnosis Date  . Compression fracture of lumbar vertebra ([place])   . Hypertension   . NSCL ca dx'd 05/2017     Past Surgical History:  Procedure Laterality Date  . BACK SURGERY  2009   ruptured disc     CURRENT MEDICATIONS:    Current Outpatient Medications on File Prior to Visit  Medication Sig Dispense Refill  . acetaminophen (TYLENOL) 500 MG tablet Take 1,000 mg by mouth every 6 (six) hours as needed for mild pain or moderate pain.    Marland Kitchen amLODipine (NORVASC) 10 MG tablet Take  10 mg by mouth daily.    . rosuvastatin (CRESTOR) 20 MG tablet Take 20 mg by mouth daily.    Marland Kitchen spironolactone (ALDACTONE) 25 MG tablet Take 25 mg by mouth daily.     No current facility-administered medications on file prior to visit.    ALLERGIES:    Allergies  Allergen Reactions  . Lisinopril-Hydrochlorothiazide Swelling    Swelling to face/lips      Vitals:   04/15/19 1332  BP: (!) 146/75  Pulse: 85  Resp: 18  Temp: 98.2 F (36.8 C)  SpO2: 98%     Filed Weights   04/15/19 1332  Weight: 210 lb 12.8 oz (95.6 kg)     LABORATORY DATA:      DIAGNOSTIC IMAGING:     ASSESSMENT & PLAN:   Mr./Ms. Ashley Wilson is a pleasant 64 y.o. African American female with history of Stage IIIB (T3, N2, M0), non-small cell lung cancer, adenocarcinoma. diagnosed in March 2019. She was treated with 7 cycles of concurrent chemoradiation with carboplatin for an AUC of 2 and paclitaxel 45 mg/m2; completed on Jul 14, 2017. She then completed 26 cycles of consolidation immunotherapy with Imfinzi 10 mg/kg IV every 2 weeks; completed on 08/19/2018  Patient presents to survivorship clinic today for routine surveillance as a long-term cancer survivor.    1. History of Stage IIIB cancer:  Ashley Wilson is currently clinically and radiographically without evidence of disease or recurrence of lung cancer. She will follow-up  in the Survivorship Clinic in 1 year with labs, history, and physical exam per surveillance protocol.  I encouraged her to call me with any questions or concerns before her next visit at the cancer center, and I would be happy to see the patient sooner, if needed.     2. Cancer screening:  Due to Ashley Wilson's history and age, she should receive screening for skin cancers, bone density, breast cancer, colon cancer, and gynecologic cancers, if not already up to date. The patient was encouraged to follow-up with her PCP for appropriate cancer screenings. The patient is very reluctant to perform health  maintenance screenings. I spent several minutes with the patient counseling her on the importance of health screenings with early detection of malignancies and other health conditions. The patient is particularly hesitant with pharmacologic treatments of health conditions, such as her diabetes mellitus. Discussed the purpose of her medications in preventing complications for her health conditions.   3. Health maintenance and wellness promotion: Ashley Wilson was encouraged to consume 5-7 servings of fruits and vegetables per day. The patient was also encouraged to engage in moderate to vigorous exercise for 30 minutes per day most days of the week. We discussed that a healthy BMI is 18.5-24.9 and that maintaining a health weight reduces risk of cancer recurrences.  Ashley Wilson was instructed to limit her alcohol consumption and continue to abstain from tobacco use/was encouraged stop smoking.  Lastly, she was encouraged to use sunscreen and wear protective clothing when in the sun.     Disposition:   -Follow up on June 15, 2019 with Dr. Julien Nordmann with routine CT scan of the chest to assess for recurrence -CT chest due: ~06/13/2019. Patient provided with the number for radiology scheduling to schedule her scan at her convenience. -Patient given information and encouraged to get the COVID-19 vaccine  [place], PA-C 04/15/2019  Note: [place], [place], [place]   [phone]

## 2019-04-15 ENCOUNTER — Other Ambulatory Visit: Payer: Self-pay

## 2019-04-15 ENCOUNTER — Inpatient Hospital Stay: Payer: Medicare Other | Attending: Internal Medicine | Admitting: Physician Assistant

## 2019-04-15 VITALS — BP 146/75 | HR 85 | Temp 98.2°F | Resp 18 | Ht 63.0 in | Wt 210.8 lb

## 2019-04-15 DIAGNOSIS — Z9221 Personal history of antineoplastic chemotherapy: Secondary | ICD-10-CM | POA: Insufficient documentation

## 2019-04-15 DIAGNOSIS — Z85118 Personal history of other malignant neoplasm of bronchus and lung: Secondary | ICD-10-CM | POA: Diagnosis present

## 2019-04-15 DIAGNOSIS — Z79899 Other long term (current) drug therapy: Secondary | ICD-10-CM | POA: Diagnosis not present

## 2019-04-15 DIAGNOSIS — C3411 Malignant neoplasm of upper lobe, right bronchus or lung: Secondary | ICD-10-CM

## 2019-04-15 DIAGNOSIS — Z9225 Personal history of immunosupression therapy: Secondary | ICD-10-CM | POA: Insufficient documentation

## 2019-04-15 DIAGNOSIS — I10 Essential (primary) hypertension: Secondary | ICD-10-CM | POA: Diagnosis not present

## 2019-04-18 ENCOUNTER — Telehealth: Payer: Self-pay | Admitting: Physician Assistant

## 2019-04-18 NOTE — Telephone Encounter (Signed)
Scheduled per los. Called and left msg. Mailed printout  °

## 2019-06-13 ENCOUNTER — Ambulatory Visit (HOSPITAL_COMMUNITY)
Admission: RE | Admit: 2019-06-13 | Discharge: 2019-06-13 | Disposition: A | Discharge status: Home / Self Care | Payer: Medicare Other | Source: Ambulatory Visit | Attending: Internal Medicine | Admitting: Internal Medicine

## 2019-06-13 ENCOUNTER — Other Ambulatory Visit: Payer: Self-pay

## 2019-06-13 ENCOUNTER — Encounter (INDEPENDENT_AMBULATORY_CARE_PROVIDER_SITE_OTHER): Payer: Self-pay

## 2019-06-13 ENCOUNTER — Encounter (HOSPITAL_COMMUNITY): Payer: Self-pay

## 2019-06-13 ENCOUNTER — Inpatient Hospital Stay: Payer: Medicare Other | Attending: Internal Medicine

## 2019-06-13 DIAGNOSIS — C3411 Malignant neoplasm of upper lobe, right bronchus or lung: Secondary | ICD-10-CM | POA: Diagnosis present

## 2019-06-13 DIAGNOSIS — E039 Hypothyroidism, unspecified: Secondary | ICD-10-CM | POA: Insufficient documentation

## 2019-06-13 DIAGNOSIS — Z9221 Personal history of antineoplastic chemotherapy: Secondary | ICD-10-CM | POA: Insufficient documentation

## 2019-06-13 DIAGNOSIS — C3490 Malignant neoplasm of unspecified part of unspecified bronchus or lung: Secondary | ICD-10-CM

## 2019-06-13 DIAGNOSIS — Z79899 Other long term (current) drug therapy: Secondary | ICD-10-CM | POA: Diagnosis not present

## 2019-06-13 DIAGNOSIS — Z923 Personal history of irradiation: Secondary | ICD-10-CM | POA: Diagnosis not present

## 2019-06-13 LAB — CMP (CANCER CENTER ONLY)
ALT: 14 U/L (ref 0–44)
AST: 16 U/L (ref 15–41)
Albumin: 3.4 g/dL — ABNORMAL LOW (ref 3.5–5.0)
Alkaline Phosphatase: 102 U/L (ref 38–126)
Anion gap: 10 (ref 5–15)
BUN: 14 mg/dL (ref 8–23)
CO2: 25 mmol/L (ref 22–32)
Calcium: 9 mg/dL (ref 8.9–10.3)
Chloride: 110 mmol/L (ref 98–111)
Creatinine: 1.06 mg/dL — ABNORMAL HIGH (ref 0.44–1.00)
GFR, Est AFR Am: >60 mL/min
GFR, Estimated: 55 mL/min — ABNORMAL LOW
Glucose, Bld: 108 mg/dL — ABNORMAL HIGH (ref 70–99)
Potassium: 3.7 mmol/L (ref 3.5–5.1)
Sodium: 145 mmol/L (ref 135–145)
Total Bilirubin: 0.4 mg/dL (ref 0.3–1.2)
Total Protein: 7.3 g/dL (ref 6.5–8.1)

## 2019-06-13 LAB — CBC WITH DIFFERENTIAL (CANCER CENTER ONLY)
Abs Immature Granulocytes: 0.04 10*3/uL (ref 0.00–0.07)
Basophils Absolute: 0.1 10*3/uL (ref 0.0–0.1)
Basophils Relative: 1 %
Eosinophils Absolute: 0.2 10*3/uL (ref 0.0–0.5)
Eosinophils Relative: 3 %
HCT: 38.2 % (ref 36.0–46.0)
Hemoglobin: 12 g/dL (ref 12.0–15.0)
Immature Granulocytes: 1 %
Lymphocytes Relative: 21 %
Lymphs Abs: 1.5 10*3/uL (ref 0.7–4.0)
MCH: 26.8 pg (ref 26.0–34.0)
MCHC: 31.4 g/dL (ref 30.0–36.0)
MCV: 85.3 fL (ref 80.0–100.0)
Monocytes Absolute: 0.6 10*3/uL (ref 0.1–1.0)
Monocytes Relative: 8 %
Neutro Abs: 4.9 10*3/uL (ref 1.7–7.7)
Neutrophils Relative %: 66 %
Platelet Count: 262 10*3/uL (ref 150–400)
RBC: 4.48 MIL/uL (ref 3.87–5.11)
RDW: 15.1 % (ref 11.5–15.5)
WBC Count: 7.3 10*3/uL (ref 4.0–10.5)
nRBC: 0 % (ref 0.0–0.2)

## 2019-06-13 MED ORDER — IOHEXOL 300 MG/ML  SOLN
75.0000 mL | Freq: Once | INTRAMUSCULAR | Status: AC | PRN
  Administered 2019-06-13: 75 mL via INTRAVENOUS

## 2019-06-13 MED ORDER — SODIUM CHLORIDE (PF) 0.9 % IJ SOLN
INTRAMUSCULAR | Status: AC
  Filled 2019-06-13: qty 50

## 2019-06-15 ENCOUNTER — Telehealth: Payer: Self-pay | Admitting: Internal Medicine

## 2019-06-15 ENCOUNTER — Inpatient Hospital Stay (HOSPITAL_BASED_OUTPATIENT_CLINIC_OR_DEPARTMENT_OTHER): Payer: Medicare Other | Admitting: Internal Medicine

## 2019-06-15 ENCOUNTER — Other Ambulatory Visit: Payer: Self-pay

## 2019-06-15 ENCOUNTER — Encounter: Payer: Self-pay | Admitting: Internal Medicine

## 2019-06-15 VITALS — BP 110/60 | HR 65 | Temp 98.2°F | Resp 17 | Ht 63.0 in | Wt 211.7 lb

## 2019-06-15 DIAGNOSIS — C3490 Malignant neoplasm of unspecified part of unspecified bronchus or lung: Secondary | ICD-10-CM | POA: Diagnosis not present

## 2019-06-15 DIAGNOSIS — C3411 Malignant neoplasm of upper lobe, right bronchus or lung: Secondary | ICD-10-CM | POA: Diagnosis not present

## 2019-06-15 NOTE — Telephone Encounter (Signed)
Scheduled per los. Gave avs and calendar  

## 2019-06-15 NOTE — Progress Notes (Signed)
[place] [phone]   [phone]  OFFICE PROGRESS NOTE  [name], MD [address] [place] [address]  DIAGNOSIS: stageIIIB (T3, N2, M0)non-small cell lung cancer, adenocarcinoma diagnosed in March 2019 and presented with large right upper lobe lung mass in addition to right hilar and mediastinal lymphadenopathy.  PD-L1 0%  PRIOR THERAPY:  1) Concurrent chemoradiation with weekly carboplatin for an AUC of 2 and paclitaxel 45 mg/m2.  First dose expected on 06/01/2017.  Status post 7 cycles.  Last dose was given 07/14/2017. 2)Consolidation treatment with immunotherapy with Imfinzi (Durvalumab) 10 mg/KG every 2 weeks status post 26 cycles.  CURRENT THERAPY:  Observation.  INTERVAL HISTORY: Ashley Wilson 64 y.o. female returns to the clinic today for follow-up visit.  The patient is feeling fine today with no concerning complaints.  She denied having any current chest pain, shortness of breath, cough or hemoptysis.  She denied having any fever or chills.  She has no nausea, vomiting, diarrhea or constipation.  She has no headache or visual changes.  She did not receive her Covid vaccine and was planning to delay until September for unclear reasons.  The patient had repeat CT scan of the chest performed recently and she is here for evaluation and discussion of her risk her results.   MEDICAL HISTORY: Past Medical History:  Diagnosis Date  . Compression fracture of lumbar vertebra ([place])   . Hypertension   . NSCL ca dx'd 05/2017    ALLERGIES:  is allergic to lisinopril-hydrochlorothiazide.  MEDICATIONS:  Current Outpatient Medications  Medication Sig Dispense Refill  . acetaminophen (TYLENOL) 500 MG tablet Take 1,000 mg by mouth every 6 (six) hours as needed for mild pain or moderate pain.    Marland Kitchen amLODipine (NORVASC) 10 MG tablet Take 10 mg by mouth daily.    . rosuvastatin (CRESTOR) 20 MG tablet Take 20 mg by mouth daily.    Marland Kitchen  spironolactone (ALDACTONE) 25 MG tablet Take 25 mg by mouth daily.     No current facility-administered medications for this visit.    SURGICAL HISTORY:  Past Surgical History:  Procedure Laterality Date  . BACK SURGERY  2009   ruptured disc    REVIEW OF SYSTEMS:  A comprehensive review of systems was negative.   PHYSICAL EXAMINATION: General appearance: alert, cooperative and no distress Head: Normocephalic, without obvious abnormality, atraumatic Neck: no adenopathy, no JVD, supple, symmetrical, trachea midline and thyroid not enlarged, symmetric, no tenderness/mass/nodules Lymph nodes: Cervical, supraclavicular, and axillary nodes normal. Resp: clear to auscultation bilaterally Back: symmetric, no curvature. ROM normal. No CVA tenderness. Cardio: regular rate and rhythm, S1, S2 normal, no murmur, click, rub or gallop GI: soft, non-tender; bowel sounds normal; no masses,  no organomegaly Extremities: extremities normal, atraumatic, no cyanosis or edema  ECOG PERFORMANCE STATUS: 0 - Asymptomatic  Blood pressure 110/60, pulse 65, temperature 98.2 F (36.8 C), temperature source Temporal, resp. rate 17, height 5' 3"  (1.6 m), weight 211 lb 11.2 oz (96 kg), SpO2 98 %.  LABORATORY DATA: Lab Results  Component Value Date   WBC 7.3 06/13/2019   HGB 12.0 06/13/2019   HCT 38.2 06/13/2019   MCV 85.3 06/13/2019   PLT 262 06/13/2019      Chemistry      Component Value Date/Time   NA 145 06/13/2019 0803   K 3.7 06/13/2019 0803   CL 110 06/13/2019 0803   CO2 25 06/13/2019 0803  BUN 14 06/13/2019 0803   CREATININE 1.06 (H) 06/13/2019 0803      Component Value Date/Time   CALCIUM 9.0 06/13/2019 0803   ALKPHOS 102 06/13/2019 0803   AST 16 06/13/2019 0803   ALT 14 06/13/2019 0803   BILITOT 0.4 06/13/2019 0803       RADIOGRAPHIC STUDIES: CT Chest W Contrast  Result Date: 06/13/2019 CLINICAL DATA:  Non-small-cell lung cancer. Restaging. EXAM: CT CHEST WITH CONTRAST  TECHNIQUE: Multidetector CT imaging of the chest was performed during intravenous contrast administration. CONTRAST:  51m OMNIPAQUE IOHEXOL 300 MG/ML  SOLN COMPARISON:  03/15/2019 FINDINGS: Cardiovascular: The heart size is normal. No substantial pericardial effusion. Coronary artery calcification is evident. Atherosclerotic calcification is noted in the wall of the thoracic aorta. Mediastinum/Nodes: 10 mm AP window lymph node is upper normal in stable in the interval. Small right paratracheal node is unchanged. No mediastinal lymphadenopathy. No left hilar lymphadenopathy. Post treatment changes in the right hilum are similar to prior. Tiny hiatal hernia. The esophagus has normal imaging features. There is no axillary lymphadenopathy. Lungs/Pleura: Pleuroparenchymal opacity in the right apex is not substantially changed. Nodular component measured previously at 4.8 x 3.6 cm is now 4.6 x 3.4 cm. Interstitial and consolidative air space opacity in the right parahilar lung is stable, likely scarring from radiation therapy. Centrilobular emphsyema noted. No new suspicious nodule or mass. No pleural effusion. Upper Abdomen: Unremarkable. The upper normal gastrohepatic ligament lymph node identified previously is stable. Musculoskeletal: Old posterior right rib fractures are again noted. No worrisome lytic or sclerotic osseous abnormality. IMPRESSION: 1. Stable exam. No new or progressive findings. 2. Pleuroparenchymal opacity in the right apex is not substantially changed in the interval. Continued attention on follow-up recommended. 3. Aortic Atherosclerosis (ICD10-I70.0) and Emphysema (ICD10-J43.9). Electronically Signed   By: EMisty StanleyM.D.   On: 06/13/2019 10:08    ASSESSMENT AND PLAN: This is a very pleasant 64years old [name] with stage IIIB non-small cell lung cancer, adenocarcinoma.   The patient underwent a course of concurrent chemoradiation with weekly carboplatin and paclitaxel  status post 7 cycles.  She tolerated this course of treatment fairly well with no concerning complaints.   The patient completed treatment with consolidation immunotherapy with Imfinzi (Durvalumab) 10 mg/KG every 2 weeks status post 26 cycles. The patient has been in observation since that time and she is feeling fine. She had repeat CT scan of the chest performed recently.  I personally and independently reviewed the scans and discussed the results with the patient today. Her scan showed no concerning findings for disease progression. I recommended for the patient to continue on observation with repeat CT scan of the chest in 6 months unless she has any symptoms in the interval we will be happy to see her sooner. I strongly encouraged the patient to reconsider her decision regarding receiving Covid vaccine sooner. She was advised to call immediately if she has any other concerning symptoms. For the hypothyroidism, she will continue her current treatment with levothyroxine. The patient voices understanding of current disease status and treatment options and is in agreement with the current care plan. All questions were answered. The patient knows to call the clinic with any problems, questions or concerns. We can certainly see the patient much sooner if necessary.  Disclaimer: This note was dictated with voice recognition software. Similar sounding words can inadvertently be transcribed and may not be corrected upon review.

## 2019-12-13 ENCOUNTER — Other Ambulatory Visit: Payer: Self-pay

## 2019-12-13 ENCOUNTER — Inpatient Hospital Stay: Payer: Medicare Other | Attending: Internal Medicine

## 2019-12-13 DIAGNOSIS — R5383 Other fatigue: Secondary | ICD-10-CM | POA: Insufficient documentation

## 2019-12-13 DIAGNOSIS — Z9221 Personal history of antineoplastic chemotherapy: Secondary | ICD-10-CM | POA: Insufficient documentation

## 2019-12-13 DIAGNOSIS — E039 Hypothyroidism, unspecified: Secondary | ICD-10-CM | POA: Insufficient documentation

## 2019-12-13 DIAGNOSIS — Z79899 Other long term (current) drug therapy: Secondary | ICD-10-CM | POA: Diagnosis not present

## 2019-12-13 DIAGNOSIS — Z9225 Personal history of immunosupression therapy: Secondary | ICD-10-CM | POA: Insufficient documentation

## 2019-12-13 DIAGNOSIS — Z923 Personal history of irradiation: Secondary | ICD-10-CM | POA: Diagnosis not present

## 2019-12-13 DIAGNOSIS — C3490 Malignant neoplasm of unspecified part of unspecified bronchus or lung: Secondary | ICD-10-CM | POA: Insufficient documentation

## 2019-12-13 DIAGNOSIS — I119 Hypertensive heart disease without heart failure: Secondary | ICD-10-CM | POA: Insufficient documentation

## 2019-12-13 LAB — CBC WITH DIFFERENTIAL (CANCER CENTER ONLY)
Abs Immature Granulocytes: 0.04 10*3/uL (ref 0.00–0.07)
Basophils Absolute: 0.1 10*3/uL (ref 0.0–0.1)
Basophils Relative: 1 %
Eosinophils Absolute: 0.2 10*3/uL (ref 0.0–0.5)
Eosinophils Relative: 3 %
HCT: 39.4 % (ref 36.0–46.0)
Hemoglobin: 12.4 g/dL (ref 12.0–15.0)
Immature Granulocytes: 1 %
Lymphocytes Relative: 19 %
Lymphs Abs: 1.3 10*3/uL (ref 0.7–4.0)
MCH: 26.8 pg (ref 26.0–34.0)
MCHC: 31.5 g/dL (ref 30.0–36.0)
MCV: 85.3 fL (ref 80.0–100.0)
Monocytes Absolute: 0.5 10*3/uL (ref 0.1–1.0)
Monocytes Relative: 7 %
Neutro Abs: 4.8 10*3/uL (ref 1.7–7.7)
Neutrophils Relative %: 69 %
Platelet Count: 269 10*3/uL (ref 150–400)
RBC: 4.62 MIL/uL (ref 3.87–5.11)
RDW: 15.6 % — ABNORMAL HIGH (ref 11.5–15.5)
WBC Count: 7 10*3/uL (ref 4.0–10.5)
nRBC: 0 % (ref 0.0–0.2)

## 2019-12-13 LAB — CMP (CANCER CENTER ONLY)
ALT: 17 U/L (ref 0–44)
AST: 15 U/L (ref 15–41)
Albumin: 3.6 g/dL (ref 3.5–5.0)
Alkaline Phosphatase: 98 U/L (ref 38–126)
Anion gap: 8 (ref 5–15)
BUN: 22 mg/dL (ref 8–23)
CO2: 24 mmol/L (ref 22–32)
Calcium: 9.8 mg/dL (ref 8.9–10.3)
Chloride: 110 mmol/L (ref 98–111)
Creatinine: 1.17 mg/dL — ABNORMAL HIGH (ref 0.44–1.00)
GFR, Estimated: 51 mL/min — ABNORMAL LOW (ref >60)
Glucose, Bld: 109 mg/dL — ABNORMAL HIGH (ref 70–99)
Potassium: 4.1 mmol/L (ref 3.5–5.1)
Sodium: 142 mmol/L (ref 135–145)
Total Bilirubin: 0.4 mg/dL (ref 0.3–1.2)
Total Protein: 7.7 g/dL (ref 6.5–8.1)

## 2019-12-14 ENCOUNTER — Ambulatory Visit (HOSPITAL_COMMUNITY)
Admission: RE | Admit: 2019-12-14 | Discharge: 2019-12-14 | Disposition: A | Discharge status: Home / Self Care | Payer: Medicare Other | Source: Ambulatory Visit | Attending: Internal Medicine | Admitting: Internal Medicine

## 2019-12-14 ENCOUNTER — Encounter (HOSPITAL_COMMUNITY): Payer: Self-pay

## 2019-12-14 DIAGNOSIS — C3490 Malignant neoplasm of unspecified part of unspecified bronchus or lung: Secondary | ICD-10-CM | POA: Insufficient documentation

## 2019-12-14 MED ORDER — IOHEXOL 300 MG/ML  SOLN
75.0000 mL | Freq: Once | INTRAMUSCULAR | Status: AC | PRN
  Administered 2019-12-14: 75 mL via INTRAVENOUS

## 2019-12-15 ENCOUNTER — Inpatient Hospital Stay: Payer: Medicare Other

## 2019-12-15 ENCOUNTER — Encounter: Payer: Self-pay | Admitting: Internal Medicine

## 2019-12-15 ENCOUNTER — Other Ambulatory Visit: Payer: Self-pay

## 2019-12-15 ENCOUNTER — Telehealth: Payer: Self-pay | Admitting: Internal Medicine

## 2019-12-15 ENCOUNTER — Telehealth: Payer: Self-pay | Admitting: Hematology

## 2019-12-15 ENCOUNTER — Inpatient Hospital Stay (HOSPITAL_BASED_OUTPATIENT_CLINIC_OR_DEPARTMENT_OTHER): Payer: Medicare Other | Admitting: Internal Medicine

## 2019-12-15 VITALS — BP 121/59 | HR 70 | Temp 97.2°F | Resp 18 | Ht 63.0 in | Wt 219.3 lb

## 2019-12-15 DIAGNOSIS — C3490 Malignant neoplasm of unspecified part of unspecified bronchus or lung: Secondary | ICD-10-CM | POA: Diagnosis not present

## 2019-12-15 DIAGNOSIS — E039 Hypothyroidism, unspecified: Secondary | ICD-10-CM | POA: Diagnosis not present

## 2019-12-15 LAB — CBC WITH DIFFERENTIAL (CANCER CENTER ONLY)
Abs Immature Granulocytes: 0.05 10*3/uL (ref 0.00–0.07)
Basophils Absolute: 0.1 10*3/uL (ref 0.0–0.1)
Basophils Relative: 1 %
Eosinophils Absolute: 0.2 10*3/uL (ref 0.0–0.5)
Eosinophils Relative: 3 %
HCT: 40.3 % (ref 36.0–46.0)
Hemoglobin: 12.8 g/dL (ref 12.0–15.0)
Immature Granulocytes: 1 %
Lymphocytes Relative: 18 %
Lymphs Abs: 1.3 10*3/uL (ref 0.7–4.0)
MCH: 26.9 pg (ref 26.0–34.0)
MCHC: 31.8 g/dL (ref 30.0–36.0)
MCV: 84.7 fL (ref 80.0–100.0)
Monocytes Absolute: 0.7 10*3/uL (ref 0.1–1.0)
Monocytes Relative: 9 %
Neutro Abs: 5.1 10*3/uL (ref 1.7–7.7)
Neutrophils Relative %: 68 %
Platelet Count: 277 10*3/uL (ref 150–400)
RBC: 4.76 MIL/uL (ref 3.87–5.11)
RDW: 15.6 % — ABNORMAL HIGH (ref 11.5–15.5)
WBC Count: 7.5 10*3/uL (ref 4.0–10.5)
nRBC: 0 % (ref 0.0–0.2)

## 2019-12-15 LAB — CMP (CANCER CENTER ONLY)
ALT: 15 U/L (ref 0–44)
AST: 15 U/L (ref 15–41)
Albumin: 3.7 g/dL (ref 3.5–5.0)
Alkaline Phosphatase: 100 U/L (ref 38–126)
Anion gap: 9 (ref 5–15)
BUN: 20 mg/dL (ref 8–23)
CO2: 24 mmol/L (ref 22–32)
Calcium: 9.9 mg/dL (ref 8.9–10.3)
Chloride: 106 mmol/L (ref 98–111)
Creatinine: 1.01 mg/dL — ABNORMAL HIGH (ref 0.44–1.00)
GFR, Estimated: >60 mL/min (ref >60)
Glucose, Bld: 101 mg/dL — ABNORMAL HIGH (ref 70–99)
Potassium: 4.5 mmol/L (ref 3.5–5.1)
Sodium: 139 mmol/L (ref 135–145)
Total Bilirubin: 0.4 mg/dL (ref 0.3–1.2)
Total Protein: 8 g/dL (ref 6.5–8.1)

## 2019-12-15 LAB — TSH: TSH: 2.771 u[IU]/mL (ref 0.308–3.960)

## 2019-12-15 NOTE — Telephone Encounter (Signed)
Scheduled per 10/28 los. Printed avs and calendar for pt.

## 2019-12-15 NOTE — Telephone Encounter (Signed)
Error. Dr. Worthy Flank pt.

## 2019-12-15 NOTE — Progress Notes (Signed)
[place] [phone]   Fax:([phone]  OFFICE PROGRESS NOTE  [name], MD [address] [place] [address]  DIAGNOSIS: stageIIIB (T3, N2, M0)non-small cell lung cancer, adenocarcinoma diagnosed in March 2019 and presented with large right upper lobe lung mass in addition to right hilar and mediastinal lymphadenopathy.  PD-L1 0%  PRIOR THERAPY:  1) Concurrent chemoradiation with weekly carboplatin for an AUC of 2 and paclitaxel 45 mg/m2.  First dose expected on 06/01/2017.  Status post 7 cycles.  Last dose was given 07/14/2017. 2)Consolidation treatment with immunotherapy with Imfinzi (Durvalumab) 10 mg/KG every 2 weeks status post 26 cycles.  CURRENT THERAPY:  Observation.  INTERVAL HISTORY: Ashley Wilson 64 y.o. female returns to the clinic today for 6 months follow-up visit.  The patient is feeling fine today with no concerning complaints except for fatigue and gaining weight.  She is not taking her thyroid medicine as previously prescribed.  She denied having any current chest pain, shortness of breath, cough or hemoptysis.  She denied having any fever or chills.  She has no nausea, vomiting, diarrhea or constipation.  She denied having any headache or visual changes.  The patient had repeat CT scan of the chest performed recently and she is here for evaluation and discussion of her risk her results.   MEDICAL HISTORY: Past Medical History:  Diagnosis Date   Compression fracture of lumbar vertebra ([place])    Hypertension    NSCL ca dx'd 05/2017    ALLERGIES:  is allergic to lisinopril-hydrochlorothiazide.  MEDICATIONS:  Current Outpatient Medications  Medication Sig Dispense Refill   acetaminophen (TYLENOL) 500 MG tablet Take 1,000 mg by mouth every 6 (six) hours as needed for mild pain or moderate pain.     amLODipine (NORVASC) 10 MG tablet Take 10 mg by mouth daily.     rosuvastatin (CRESTOR) 20 MG tablet Take 20 mg by  mouth daily.     spironolactone (ALDACTONE) 25 MG tablet Take 25 mg by mouth daily.     No current facility-administered medications for this visit.    SURGICAL HISTORY:  Past Surgical History:  Procedure Laterality Date   BACK SURGERY  2009   ruptured disc    REVIEW OF SYSTEMS:  A comprehensive review of systems was negative except for: Constitutional: positive for fatigue   PHYSICAL EXAMINATION: General appearance: alert, cooperative, fatigued and no distress Head: Normocephalic, without obvious abnormality, atraumatic Neck: no adenopathy, no JVD, supple, symmetrical, trachea midline and thyroid not enlarged, symmetric, no tenderness/mass/nodules Lymph nodes: Cervical, supraclavicular, and axillary nodes normal. Resp: clear to auscultation bilaterally Back: symmetric, no curvature. ROM normal. No CVA tenderness. Cardio: regular rate and rhythm, S1, S2 normal, no murmur, click, rub or gallop GI: soft, non-tender; bowel sounds normal; no masses,  no organomegaly Extremities: extremities normal, atraumatic, no cyanosis or edema  ECOG PERFORMANCE STATUS: 1 - Symptomatic but completely ambulatory  Blood pressure (!) 121/59, pulse 70, temperature (!) 97.2 F (36.2 C), temperature source Tympanic, resp. rate 18, height 5' 3"  (1.6 m), weight 219 lb 4.8 oz (99.5 kg), SpO2 99 %.  LABORATORY DATA: Lab Results  Component Value Date   WBC 7.0 12/13/2019   HGB 12.4 12/13/2019   HCT 39.4 12/13/2019   MCV 85.3 12/13/2019   PLT 269 12/13/2019      Chemistry      Component Value Date/Time   NA 142 12/13/2019 0923   K 4.1 12/13/2019 3354  CL 110 12/13/2019 0923   CO2 24 12/13/2019 0923   BUN 22 12/13/2019 0923   CREATININE 1.17 (H) 12/13/2019 0923      Component Value Date/Time   CALCIUM 9.8 12/13/2019 0923   ALKPHOS 98 12/13/2019 0923   AST 15 12/13/2019 0923   ALT 17 12/13/2019 0923   BILITOT 0.4 12/13/2019 0923       RADIOGRAPHIC STUDIES: CT Chest W  Contrast  Result Date: 12/14/2019 CLINICAL DATA:  Primary Cancer Type: Lung Imaging Indication: Routine surveillance Interval therapy since last imaging? No Initial Cancer Diagnosis Date: 04/28/2017; Established by: Biopsy-proven Detailed Pathology: Stage IIIB non-small cell lung cancer, adenocarcinoma. Primary Tumor location: Right upper lobe. Surgeries: No. Chemotherapy: Yes; Ongoing? No; Most recent administration: 07/14/2017 Immunotherapy?  Yes; Type: Imfinzi; Ongoing? No Radiation therapy? Yes; Date Range: 06/02/2017 - 07/17/2017; Target: Right lung EXAM: CT CHEST WITH CONTRAST TECHNIQUE: Multidetector CT imaging of the chest was performed during intravenous contrast administration. CONTRAST:  66m OMNIPAQUE IOHEXOL 300 MG/ML  SOLN COMPARISON:  Most recent CT chest 06/13/2019.  05/21/2017 PET-CT. FINDINGS: Cardiovascular: Atherosclerotic calcification of the aorta, aortic valve and coronary arteries. Heart is enlarged. No pericardial effusion. Mediastinum/Nodes: Mediastinal lymph nodes measure up to 8 mm in the low right paratracheal station, stable. Right hilar lymph node measures 9 mm, also stable. No left hilar or axillary adenopathy. Esophagus is grossly unremarkable. Lungs/Pleura: Heterogeneous consolidation with volume loss and bronchiectasis in the upper right hemithorax, stable. Centrilobular emphysema with bullous components. Volume loss in the right lower lobe and lingula. Possible superimposed consolidation in the inferior lingula (7/100). No pleural fluid. Airway is otherwise unremarkable. Upper Abdomen: Subcentimeter low-attenuation lesions in the liver are too small to characterize. Visualized portions of the liver, gallbladder and adrenal glands are otherwise unremarkable. Renal cortical thinning on the right. Low-attenuation lesions in the kidneys measure up to 6.2 cm on the left, incompletely visualized. Subcentimeter low-attenuation lesions in the spleen are too small to characterize.  Visualized portion of the pancreas is unremarkable. Small hiatal hernia. Gastrohepatic ligament lymph nodes measure up to 9 mm, unchanged. Musculoskeletal: Degenerative changes in the spine. Old right rib fractures. IMPRESSION: 1. Post treatment changes in the upper right hemithorax without evidence of recurrent or metastatic disease. 2. Question small area of infectious/inflammatory consolidation in the inferior lingula. Attention on follow-up. 3. Aortic atherosclerosis (ICD10-I70.0). Coronary artery calcification. 4.  Emphysema (ICD10-J43.9). Electronically Signed   By: MLorin PicketM.D.   On: 12/14/2019 10:23    ASSESSMENT AND PLAN: This is a very pleasant 64years old [name] with stage IIIB non-small cell lung cancer, adenocarcinoma.   The patient underwent a course of concurrent chemoradiation with weekly carboplatin and paclitaxel status post 7 cycles.  She tolerated this course of treatment fairly well with no concerning complaints.   The patient completed treatment with consolidation immunotherapy with Imfinzi (Durvalumab) 10 mg/KG every 2 weeks status post 26 cycles. The patient is currently on observation and she is feeling fine except for fatigue. She had repeat CT scan of the chest performed recently.  I personally and independently reviewed the scans and discussed the results with the patient today. Her scan showed no concerning findings for disease progression. I recommended for her to continue on observation with repeat CT scan of the chest in 6 months. For the hypothyroidism, she she has been off her medication for a while on her own and she did not follow with her primary care physician for close monitoring of her thyroid function.  I will arrange for the patient to have repeat TSH today and depending on the results we will resume her treatment with levothyroxine. She was advised to call immediately if she has any other concerning symptoms in the interval.  The  patient voices understanding of current disease status and treatment options and is in agreement with the current care plan. All questions were answered. The patient knows to call the clinic with any problems, questions or concerns. We can certainly see the patient much sooner if necessary.  Disclaimer: This note was dictated with voice recognition software. Similar sounding words can inadvertently be transcribed and may not be corrected upon review.

## 2020-04-13 ENCOUNTER — Telehealth: Payer: Self-pay

## 2020-04-13 ENCOUNTER — Encounter: Admitting: Physician Assistant

## 2020-04-13 NOTE — Telephone Encounter (Signed)
Pt presented today 04/13/20 for an appt with Cassie PA-C for a Survivorship care Plan. Pt is very upset because her appt was changed and she states she was not contacted about this change. I advised the pt this change was made 02/21/20 and apologized if she was not notified of this. Pt states she does not answer her phone at home because of solicitation calls and does nor does she have her answering machine connected. Pt is also not on MyChart and feels a letter should've been sent.   Pt states she is very upset about this because she had to adjust her home schedule (she watches her grand children) for this appt. I apologized to the pt for her experience and she states she has no reason to be seen for a Survivorship appt anyway and does not want to see Cassie, PA-C because "she is doing just fine" and this would've been a "waste of an appointment" no matter what.  Pt requested a copy of her future appts. I have printed the pts appt calendar as requested. Pt also requested this be documented in her chart. I advised I would do so.

## 2020-04-20 ENCOUNTER — Encounter: Admitting: Physician Assistant

## 2020-06-11 ENCOUNTER — Other Ambulatory Visit: Payer: Self-pay | Admitting: Physician Assistant

## 2020-06-11 DIAGNOSIS — C3411 Malignant neoplasm of upper lobe, right bronchus or lung: Secondary | ICD-10-CM

## 2020-06-12 ENCOUNTER — Other Ambulatory Visit: Payer: Self-pay

## 2020-06-12 ENCOUNTER — Ambulatory Visit (HOSPITAL_COMMUNITY)
Admission: RE | Admit: 2020-06-12 | Discharge: 2020-06-12 | Disposition: A | Discharge status: Home / Self Care | Payer: Medicare Other | Source: Ambulatory Visit | Attending: Internal Medicine | Admitting: Internal Medicine

## 2020-06-12 ENCOUNTER — Inpatient Hospital Stay: Payer: Medicare Other | Attending: Internal Medicine

## 2020-06-12 ENCOUNTER — Encounter (HOSPITAL_COMMUNITY): Payer: Self-pay

## 2020-06-12 DIAGNOSIS — J302 Other seasonal allergic rhinitis: Secondary | ICD-10-CM | POA: Insufficient documentation

## 2020-06-12 DIAGNOSIS — R5383 Other fatigue: Secondary | ICD-10-CM | POA: Diagnosis not present

## 2020-06-12 DIAGNOSIS — Z9221 Personal history of antineoplastic chemotherapy: Secondary | ICD-10-CM | POA: Insufficient documentation

## 2020-06-12 DIAGNOSIS — Z79899 Other long term (current) drug therapy: Secondary | ICD-10-CM | POA: Diagnosis not present

## 2020-06-12 DIAGNOSIS — I10 Essential (primary) hypertension: Secondary | ICD-10-CM | POA: Insufficient documentation

## 2020-06-12 DIAGNOSIS — C3411 Malignant neoplasm of upper lobe, right bronchus or lung: Secondary | ICD-10-CM | POA: Diagnosis not present

## 2020-06-12 DIAGNOSIS — E039 Hypothyroidism, unspecified: Secondary | ICD-10-CM | POA: Diagnosis not present

## 2020-06-12 DIAGNOSIS — C3490 Malignant neoplasm of unspecified part of unspecified bronchus or lung: Secondary | ICD-10-CM

## 2020-06-12 LAB — CBC WITH DIFFERENTIAL (CANCER CENTER ONLY)
Abs Immature Granulocytes: 0.06 10*3/uL (ref 0.00–0.07)
Basophils Absolute: 0.1 10*3/uL (ref 0.0–0.1)
Basophils Relative: 1 %
Eosinophils Absolute: 0.7 10*3/uL — ABNORMAL HIGH (ref 0.0–0.5)
Eosinophils Relative: 10 %
HCT: 38.7 % (ref 36.0–46.0)
Hemoglobin: 12.4 g/dL (ref 12.0–15.0)
Immature Granulocytes: 1 %
Lymphocytes Relative: 19 %
Lymphs Abs: 1.4 10*3/uL (ref 0.7–4.0)
MCH: 27 pg (ref 26.0–34.0)
MCHC: 32 g/dL (ref 30.0–36.0)
MCV: 84.3 fL (ref 80.0–100.0)
Monocytes Absolute: 0.6 10*3/uL (ref 0.1–1.0)
Monocytes Relative: 8 %
Neutro Abs: 4.7 10*3/uL (ref 1.7–7.7)
Neutrophils Relative %: 61 %
Platelet Count: 294 10*3/uL (ref 150–400)
RBC: 4.59 MIL/uL (ref 3.87–5.11)
RDW: 15 % (ref 11.5–15.5)
WBC Count: 7.5 10*3/uL (ref 4.0–10.5)
nRBC: 0 % (ref 0.0–0.2)

## 2020-06-12 LAB — CMP (CANCER CENTER ONLY)
ALT: 15 U/L (ref 0–44)
AST: 17 U/L (ref 15–41)
Albumin: 3.3 g/dL — ABNORMAL LOW (ref 3.5–5.0)
Alkaline Phosphatase: 81 U/L (ref 38–126)
Anion gap: 12 (ref 5–15)
BUN: 14 mg/dL (ref 8–23)
CO2: 20 mmol/L — ABNORMAL LOW (ref 22–32)
Calcium: 9.3 mg/dL (ref 8.9–10.3)
Chloride: 111 mmol/L (ref 98–111)
Creatinine: 1.03 mg/dL — ABNORMAL HIGH (ref 0.44–1.00)
GFR, Estimated: 60 mL/min — ABNORMAL LOW (ref >60)
Glucose, Bld: 119 mg/dL — ABNORMAL HIGH (ref 70–99)
Potassium: 3.9 mmol/L (ref 3.5–5.1)
Sodium: 143 mmol/L (ref 135–145)
Total Bilirubin: 0.3 mg/dL (ref 0.3–1.2)
Total Protein: 7.6 g/dL (ref 6.5–8.1)

## 2020-06-12 LAB — TSH: TSH: 5.729 u[IU]/mL — ABNORMAL HIGH (ref 0.308–3.960)

## 2020-06-12 MED ORDER — IOHEXOL 300 MG/ML  SOLN
75.0000 mL | Freq: Once | INTRAMUSCULAR | Status: AC | PRN
  Administered 2020-06-12: 75 mL via INTRAVENOUS

## 2020-06-14 ENCOUNTER — Other Ambulatory Visit: Payer: Self-pay

## 2020-06-14 ENCOUNTER — Inpatient Hospital Stay (HOSPITAL_BASED_OUTPATIENT_CLINIC_OR_DEPARTMENT_OTHER): Payer: Medicare Other | Admitting: Internal Medicine

## 2020-06-14 ENCOUNTER — Telehealth: Payer: Self-pay | Admitting: Internal Medicine

## 2020-06-14 ENCOUNTER — Encounter: Payer: Self-pay | Admitting: Internal Medicine

## 2020-06-14 VITALS — BP 122/69 | HR 69 | Temp 97.9°F | Resp 18 | Ht 63.0 in | Wt 209.2 lb

## 2020-06-14 DIAGNOSIS — C3411 Malignant neoplasm of upper lobe, right bronchus or lung: Secondary | ICD-10-CM

## 2020-06-14 DIAGNOSIS — J939 Pneumothorax, unspecified: Secondary | ICD-10-CM

## 2020-06-14 DIAGNOSIS — E039 Hypothyroidism, unspecified: Secondary | ICD-10-CM

## 2020-06-14 DIAGNOSIS — C3490 Malignant neoplasm of unspecified part of unspecified bronchus or lung: Secondary | ICD-10-CM

## 2020-06-14 NOTE — Progress Notes (Signed)
[place] [phone]   [phone]  OFFICE PROGRESS NOTE  [name], MD [address] [place] [address]  DIAGNOSIS: stageIIIB (T3, N2, M0)non-small cell lung cancer, adenocarcinoma diagnosed in March 2019 and presented with large right upper lobe lung mass in addition to right hilar and mediastinal lymphadenopathy.  PD-L1 0%  PRIOR THERAPY:  1) Concurrent chemoradiation with weekly carboplatin for an AUC of 2 and paclitaxel 45 mg/m2.  First dose expected on 06/01/2017.  Status post 7 cycles.  Last dose was given 07/14/2017. 2)Consolidation treatment with immunotherapy with Imfinzi (Durvalumab) 10 mg/KG every 2 weeks status post 26 cycles.  CURRENT THERAPY:  Observation.  INTERVAL HISTORY: Ashley Wilson 65 y.o. female returns to the clinic today for follow-up visit.  The patient is feeling fine today with no concerning complaints except for few more episodes of cough recently secondary to the allergy season.  She denied having any current chest pain, shortness of breath, or hemoptysis.  She denied having any nausea, vomiting, diarrhea or constipation.  She has no headache or visual changes.  She denied having any weight loss or night sweats.  The patient is currently on observation.  She had repeat CT scan of the chest performed recently and she is here for evaluation and discussion of her risk her results.   MEDICAL HISTORY: Past Medical History:  Diagnosis Date  . Compression fracture of lumbar vertebra ([facility])   . Hypertension   . NSCL ca dx'd 05/2017    ALLERGIES:  is allergic to lisinopril-hydrochlorothiazide.  MEDICATIONS:  Current Outpatient Medications  Medication Sig Dispense Refill  . acetaminophen (TYLENOL) 500 MG tablet Take 1,000 mg by mouth every 6 (six) hours as needed for mild pain or moderate pain.    Marland Kitchen amLODipine (NORVASC) 10 MG tablet Take 10 mg by mouth daily.    . hydrochlorothiazide (HYDRODIURIL) 12.5 MG  tablet Take 12.5 mg by mouth daily.    . rosuvastatin (CRESTOR) 20 MG tablet Take 20 mg by mouth daily.    Marland Kitchen spironolactone (ALDACTONE) 25 MG tablet Take 25 mg by mouth daily.     No current facility-administered medications for this visit.    SURGICAL HISTORY:  Past Surgical History:  Procedure Laterality Date  . BACK SURGERY  2009   ruptured disc    REVIEW OF SYSTEMS:  Constitutional: negative Eyes: negative Ears, nose, mouth, throat, and face: negative Respiratory: positive for cough Cardiovascular: negative Gastrointestinal: negative Genitourinary:negative Integument/breast: negative Hematologic/lymphatic: negative Musculoskeletal:negative Neurological: negative Behavioral/Psych: negative Endocrine: negative Allergic/Immunologic: negative   PHYSICAL EXAMINATION: General appearance: alert, cooperative, fatigued and no distress Head: Normocephalic, without obvious abnormality, atraumatic Neck: no adenopathy, no JVD, supple, symmetrical, trachea midline and thyroid not enlarged, symmetric, no tenderness/mass/nodules Lymph nodes: Cervical, supraclavicular, and axillary nodes normal. Resp: clear to auscultation bilaterally Back: symmetric, no curvature. ROM normal. No CVA tenderness. Cardio: regular rate and rhythm, S1, S2 normal, no murmur, click, rub or gallop GI: soft, non-tender; bowel sounds normal; no masses,  no organomegaly Extremities: extremities normal, atraumatic, no cyanosis or edema Neurologic: Alert and oriented X 3, normal strength and tone. Normal symmetric reflexes. Normal coordination and gait  ECOG PERFORMANCE STATUS: 1 - Symptomatic but completely ambulatory  Blood pressure 122/69, pulse 69, temperature 97.9 F (36.6 C), temperature source Tympanic, resp. rate 18, height 5' 3"  (1.6 m), weight 209 lb 3.2 oz (94.9 kg), SpO2 99 %.  LABORATORY DATA: Lab Results  Component Value Date  WBC 7.5 06/12/2020   HGB 12.4 06/12/2020   HCT 38.7 06/12/2020    MCV 84.3 06/12/2020   PLT 294 06/12/2020      Chemistry      Component Value Date/Time   NA 143 06/12/2020 0735   K 3.9 06/12/2020 0735   CL 111 06/12/2020 0735   CO2 20 (L) 06/12/2020 0735   BUN 14 06/12/2020 0735   CREATININE 1.03 (H) 06/12/2020 0735      Component Value Date/Time   CALCIUM 9.3 06/12/2020 0735   ALKPHOS 81 06/12/2020 0735   AST 17 06/12/2020 0735   ALT 15 06/12/2020 0735   BILITOT 0.3 06/12/2020 0735       RADIOGRAPHIC STUDIES: CT Chest W Contrast  Addendum Date: 06/12/2020   ADDENDUM REPORT: 06/12/2020 12:25 ADDENDUM: The original report was by Dr. Van Clines. The following addendum is by Dr. Van Clines: Critical Value/emergent results were called by telephone at the time of interpretation on 06/12/2020 at 12:07 pm to provider Dr. Curt Bears , who verbally acknowledged these results. Electronically Signed   By: Van Clines M.D.   On: 06/12/2020 12:25   Result Date: 06/12/2020 CLINICAL DATA:  Primary Cancer Type: Lung Imaging Indication: Routine surveillance Interval therapy since last imaging? No Initial Cancer Diagnosis Date: 04/28/2017; Established by: Biopsy-proven Detailed Pathology: Stage IIIB non-small cell lung cancer, adenocarcinoma. Primary Tumor location:  Right upper lobe. Surgeries: No. Chemotherapy: Yes; Ongoing? No; Most recent administration: 07/14/2017 Immunotherapy?  Yes; Type: Imfinzi; Ongoing? No Radiation therapy? Yes; Date Range: 06/02/2017 - 07/17/2017; Target: Right lung EXAM: CT CHEST WITH CONTRAST TECHNIQUE: Multidetector CT imaging of the chest was performed during intravenous contrast administration. CONTRAST:  59m OMNIPAQUE IOHEXOL 300 MG/ML  SOLN COMPARISON:  Most recent CT chest 12/14/2019.  05/21/2017 PET-CT. FINDINGS: Cardiovascular: Coronary, aortic arch, and branch vessel atherosclerotic vascular disease. Mediastinum/Nodes: Small type 1 hiatal hernia. 0.8 cm right paratracheal lymph node on image 33 series 2,  formerly the same. 0.9 cm right hilar lymph node on image 45 series 2, previously the same. Lungs/Pleura: Small right pneumothorax with apical and basilar components anteriorly. There is potentially a trace amount of pleural fluid along the apical pneumothorax component. Right upper lobe consolidation similar to the prior exam, likely primarily from radiation pneumonitis/fibrosis. A band of density in the superior segment right lower lobe is also likely related to prior radiation therapy. There is atelectasis of the right middle lobe. Airway thickening and some airway plugging noted in the right lower lobe. Upper Abdomen: We partially image a complex left renal cystic lesion with internal septation and some internal marginal calcifications, no appreciable change from prior exams, and not previously hypermetabolic. Mildly enlarged right gastric lymph node measuring 1.0 cm in diameter on image 114 of series 2, previously 0.9 cm on 12/14/2019, previously 1.0 cm and not hypermetabolic on prior PET-CT of 05/21/2017. Musculoskeletal: Late phase healing response from prior right-sided rib fractures. Mild thoracic spondylosis. IMPRESSION: 1. New small right pneumothorax with apical and basilar components. 2. Bandlike density/consolidation in the right upper lobe and superior segment right lower lobe favoring residua from prior radiation therapy. 3. Stable upper normal sized paratracheal and right hilar lymph nodes. Stable mildly enlarged right gastric lymph node, not previously hypermetabolic. 4. Small type 1 hiatal hernia. 5.  Aortic Atherosclerosis (ICD10-I70.0).  Coronary atherosclerosis. 6. Late phase healing response from prior right-sided rib fractures. 7. Similar appearance to the visualized portion of the complex left kidney upper pole cystic lesion with faint calcifications and  septations. This was not previously hypermetabolic. Radiology assistant personnel have been notified to put me in telephone contact with  the referring physician or the referring physician's clinical representative in order to discuss these findings. Once this communication is established I will issue an addendum to this report for documentation purposes. Electronically Signed: By: Van Clines M.D. On: 06/12/2020 10:32    ASSESSMENT AND PLAN: This is a very pleasant 65 years old [name] with stage IIIB non-small cell lung cancer, adenocarcinoma.   The patient underwent a course of concurrent chemoradiation with weekly carboplatin and paclitaxel status post 7 cycles.  She tolerated this course of treatment fairly well with no concerning complaints.   The patient completed treatment with consolidation immunotherapy with Imfinzi (Durvalumab) 10 mg/KG every 2 weeks status post 26 cycles. The patient is currently on observation and she is feeling fine except for fatigue. The patient has been doing fine with no concerning complaints except for increased cough recently from the seasonal allergy. She had repeat CT scan of the chest performed recently.  I personally and independently reviewed the scan images and discussed the results with the patient today. Her scan showed no concerning findings for disease progression except for the development of small right pneumothorax likely secondary to her cough and history of COPD. The patient is currently asymptomatic. I recommended for her to continue on observation with repeat chest x-ray in 1 week for evaluation of the pneumothorax. For the hypothyroidism, she will continue to follow-up with her primary care physician for close monitoring. The patient will come back for follow-up visit in 6 months for evaluation with repeat CT scan of the chest for restaging of her disease. She was advised to call immediately if she has any other concerning symptoms in the interval. The patient voices understanding of current disease status and treatment options and is in agreement with the  current care plan. All questions were answered. The patient knows to call the clinic with any problems, questions or concerns. We can certainly see the patient much sooner if necessary.  Disclaimer: This note was dictated with voice recognition software. Similar sounding words can inadvertently be transcribed and may not be corrected upon review.

## 2020-06-14 NOTE — Telephone Encounter (Signed)
Scheduled per los. Gave avs and calendar  

## 2020-07-12 ENCOUNTER — Ambulatory Visit: Admitting: Internal Medicine

## 2020-12-07 ENCOUNTER — Inpatient Hospital Stay: Payer: Medicare Other | Attending: Internal Medicine

## 2020-12-07 ENCOUNTER — Ambulatory Visit (HOSPITAL_COMMUNITY)
Admission: RE | Admit: 2020-12-07 | Discharge: 2020-12-07 | Disposition: A | Discharge status: Home / Self Care | Payer: Medicare Other | Source: Ambulatory Visit | Attending: Internal Medicine | Admitting: Internal Medicine

## 2020-12-07 ENCOUNTER — Other Ambulatory Visit: Payer: Self-pay

## 2020-12-07 ENCOUNTER — Encounter (HOSPITAL_COMMUNITY): Payer: Self-pay

## 2020-12-07 DIAGNOSIS — Z79899 Other long term (current) drug therapy: Secondary | ICD-10-CM | POA: Insufficient documentation

## 2020-12-07 DIAGNOSIS — Z7982 Long term (current) use of aspirin: Secondary | ICD-10-CM | POA: Insufficient documentation

## 2020-12-07 DIAGNOSIS — C3490 Malignant neoplasm of unspecified part of unspecified bronchus or lung: Secondary | ICD-10-CM | POA: Diagnosis present

## 2020-12-07 DIAGNOSIS — I10 Essential (primary) hypertension: Secondary | ICD-10-CM | POA: Insufficient documentation

## 2020-12-07 DIAGNOSIS — Z9221 Personal history of antineoplastic chemotherapy: Secondary | ICD-10-CM | POA: Insufficient documentation

## 2020-12-07 DIAGNOSIS — Z923 Personal history of irradiation: Secondary | ICD-10-CM | POA: Insufficient documentation

## 2020-12-07 DIAGNOSIS — E039 Hypothyroidism, unspecified: Secondary | ICD-10-CM | POA: Insufficient documentation

## 2020-12-07 DIAGNOSIS — C3411 Malignant neoplasm of upper lobe, right bronchus or lung: Secondary | ICD-10-CM | POA: Insufficient documentation

## 2020-12-07 LAB — CBC WITH DIFFERENTIAL (CANCER CENTER ONLY)
Abs Immature Granulocytes: 0.04 10*3/uL (ref 0.00–0.07)
Basophils Absolute: 0.1 10*3/uL (ref 0.0–0.1)
Basophils Relative: 1 %
Eosinophils Absolute: 0.2 10*3/uL (ref 0.0–0.5)
Eosinophils Relative: 3 %
HCT: 39.2 % (ref 36.0–46.0)
Hemoglobin: 12.7 g/dL (ref 12.0–15.0)
Immature Granulocytes: 1 %
Lymphocytes Relative: 24 %
Lymphs Abs: 1.6 10*3/uL (ref 0.7–4.0)
MCH: 27.3 pg (ref 26.0–34.0)
MCHC: 32.4 g/dL (ref 30.0–36.0)
MCV: 84.3 fL (ref 80.0–100.0)
Monocytes Absolute: 0.5 10*3/uL (ref 0.1–1.0)
Monocytes Relative: 8 %
Neutro Abs: 4.3 10*3/uL (ref 1.7–7.7)
Neutrophils Relative %: 63 %
Platelet Count: 299 10*3/uL (ref 150–400)
RBC: 4.65 MIL/uL (ref 3.87–5.11)
RDW: 15.2 % (ref 11.5–15.5)
WBC Count: 6.7 10*3/uL (ref 4.0–10.5)
nRBC: 0 % (ref 0.0–0.2)

## 2020-12-07 LAB — CMP (CANCER CENTER ONLY)
ALT: 12 U/L (ref 0–44)
AST: 13 U/L — ABNORMAL LOW (ref 15–41)
Albumin: 3.6 g/dL (ref 3.5–5.0)
Alkaline Phosphatase: 79 U/L (ref 38–126)
Anion gap: 6 (ref 5–15)
BUN: 22 mg/dL (ref 8–23)
CO2: 24 mmol/L (ref 22–32)
Calcium: 8.9 mg/dL (ref 8.9–10.3)
Chloride: 111 mmol/L (ref 98–111)
Creatinine: 1.23 mg/dL — ABNORMAL HIGH (ref 0.44–1.00)
GFR, Estimated: 48 mL/min — ABNORMAL LOW (ref >60)
Glucose, Bld: 102 mg/dL — ABNORMAL HIGH (ref 70–99)
Potassium: 4.3 mmol/L (ref 3.5–5.1)
Sodium: 141 mmol/L (ref 135–145)
Total Bilirubin: 0.5 mg/dL (ref 0.3–1.2)
Total Protein: 7.6 g/dL (ref 6.5–8.1)

## 2020-12-07 MED ORDER — IOHEXOL 350 MG/ML SOLN
60.0000 mL | Freq: Once | INTRAVENOUS | Status: AC | PRN
  Administered 2020-12-07: 60 mL via INTRAVENOUS

## 2020-12-10 ENCOUNTER — Other Ambulatory Visit: Payer: Self-pay

## 2020-12-10 ENCOUNTER — Inpatient Hospital Stay (HOSPITAL_BASED_OUTPATIENT_CLINIC_OR_DEPARTMENT_OTHER): Payer: Medicare Other | Admitting: Internal Medicine

## 2020-12-10 VITALS — BP 143/85 | HR 77 | Temp 97.0°F | Resp 20 | Ht 63.0 in | Wt 209.0 lb

## 2020-12-10 DIAGNOSIS — C3490 Malignant neoplasm of unspecified part of unspecified bronchus or lung: Secondary | ICD-10-CM | POA: Diagnosis not present

## 2020-12-10 DIAGNOSIS — Z923 Personal history of irradiation: Secondary | ICD-10-CM | POA: Diagnosis not present

## 2020-12-10 DIAGNOSIS — E039 Hypothyroidism, unspecified: Secondary | ICD-10-CM | POA: Diagnosis not present

## 2020-12-10 DIAGNOSIS — Z7982 Long term (current) use of aspirin: Secondary | ICD-10-CM | POA: Diagnosis not present

## 2020-12-10 DIAGNOSIS — C3411 Malignant neoplasm of upper lobe, right bronchus or lung: Secondary | ICD-10-CM | POA: Diagnosis present

## 2020-12-10 DIAGNOSIS — Z9221 Personal history of antineoplastic chemotherapy: Secondary | ICD-10-CM | POA: Diagnosis not present

## 2020-12-10 DIAGNOSIS — Z79899 Other long term (current) drug therapy: Secondary | ICD-10-CM | POA: Diagnosis not present

## 2020-12-10 DIAGNOSIS — I10 Essential (primary) hypertension: Secondary | ICD-10-CM | POA: Diagnosis not present

## 2020-12-10 NOTE — Progress Notes (Signed)
[place] [phone]   [phone]) [phone]  OFFICE PROGRESS NOTE  [name], MD [address] [place] [address]  DIAGNOSIS: Stage IIIB (T3, N2, M0) non-small cell lung cancer, adenocarcinoma diagnosed in March 2019 and presented with large right upper lobe lung mass in addition to right hilar and mediastinal lymphadenopathy.   PD-L1 0%   PRIOR THERAPY:  1) Concurrent chemoradiation with weekly carboplatin for an AUC of 2 and paclitaxel 45 mg/m2.  First dose expected on 06/01/2017.  Status post 7 cycles.  Last dose was given 07/14/2017. 2) Consolidation treatment with immunotherapy with Imfinzi (Durvalumab) 10 mg/KG every 2 weeks status post 26 cycles.   CURRENT THERAPY:  Observation.  INTERVAL HISTORY: Ashley Wilson 65 y.o. female returns to the clinic today for 76-monthfollow-up visit.  The patient is feeling fine today with no concerning complaints.  She denied having any current chest pain, shortness of breath, cough or hemoptysis.  She denied having any fever or chills.  She has no nausea, vomiting, diarrhea or constipation.  She has no headache or visual changes.  She denied having any weight loss or night sweats.  She had repeat CT scan of the chest performed recently and she is here for evaluation and discussion of her scan results.   MEDICAL HISTORY: Past Medical History:  Diagnosis Date   [place]    Hypertension    NSCL ca dx'd 05/2017    ALLERGIES:  is allergic to lisinopril-hydrochlorothiazide.  MEDICATIONS:  Current Outpatient Medications  Medication Sig Dispense Refill   acetaminophen (TYLENOL) 500 MG tablet Take 1,000 mg by mouth every 6 (six) hours as needed for mild pain or moderate pain.     amLODipine (NORVASC) 10 MG tablet Take 10 mg by mouth daily.     aspirin 81 MG EC tablet Take 1 tablet by mouth daily.     rosuvastatin (CRESTOR) 20 MG tablet Take 20 mg by mouth daily.      spironolactone (ALDACTONE) 25 MG tablet Take 25 mg by mouth daily.     No current facility-administered medications for this visit.    SURGICAL HISTORY:  Past Surgical History:  Procedure Laterality Date   BACK SURGERY  2009   ruptured disc    REVIEW OF SYSTEMS:  A comprehensive review of systems was negative.   PHYSICAL EXAMINATION: General appearance: alert, cooperative, and no distress Head: Normocephalic, without obvious abnormality, atraumatic Neck: no adenopathy, no JVD, supple, symmetrical, trachea midline, and thyroid not enlarged, symmetric, no tenderness/mass/nodules Lymph nodes: Cervical, supraclavicular, and axillary nodes normal. Resp: clear to auscultation bilaterally Back: symmetric, no curvature. ROM normal. No CVA tenderness. Cardio: regular rate and rhythm, S1, S2 normal, no murmur, click, rub or gallop GI: soft, non-tender; bowel sounds normal; no masses,  no organomegaly Extremities: extremities normal, atraumatic, no cyanosis or edema  ECOG PERFORMANCE STATUS: 1 - Symptomatic but completely ambulatory  Blood pressure (!) 143/85, pulse 77, temperature (!) 97 F (36.1 C), temperature source Tympanic, resp. rate 20, height 5' 3"  (1.6 m), weight 209 lb (94.8 kg), SpO2 99 %.  LABORATORY DATA: Lab Results  Component Value Date   WBC 6.7 12/07/2020   HGB 12.7 12/07/2020   HCT 39.2 12/07/2020   MCV 84.3 12/07/2020   PLT 299 12/07/2020      Chemistry      Component Value Date/Time   NA 141 12/07/2020 0758   K 4.3 12/07/2020  0758   CL 111 12/07/2020 0758   CO2 24 12/07/2020 0758   BUN 22 12/07/2020 0758   CREATININE 1.23 (H) 12/07/2020 0758      Component Value Date/Time   CALCIUM 8.9 12/07/2020 0758   ALKPHOS 79 12/07/2020 0758   AST 13 (L) 12/07/2020 0758   ALT 12 12/07/2020 0758   BILITOT 0.5 12/07/2020 0758       RADIOGRAPHIC STUDIES: CT Chest W Contrast  Result Date: 12/07/2020 CLINICAL DATA:  Primary Cancer Type: Lung Imaging Indication:  Routine surveillance Interval therapy since last imaging? No Initial Cancer Diagnosis Date: 04/28/2017; Established by: Biopsy-proven Detailed Pathology: Stage IIIB non-small cell lung cancer, adenocarcinoma. Primary Tumor location:  Right upper lobe. Surgeries: No. Chemotherapy: Yes; Ongoing? No; Most recent administration: 07/14/2017 Immunotherapy?  Yes; Type: Imfinzi; Ongoing? No Radiation therapy? Yes; Date Range: 06/02/2017 - 07/17/2017; Target: Right lung EXAM: CT CHEST WITH CONTRAST TECHNIQUE: Multidetector CT imaging of the chest was performed during intravenous contrast administration. CONTRAST:  57m OMNIPAQUE IOHEXOL 350 MG/ML SOLN COMPARISON:  Most recent CT chest 06/12/2020. 05/21/2017 PET-CT. FINDINGS: Cardiovascular: Aortic atherosclerosis. Tortuous thoracic aorta. Mild cardiomegaly with LAD coronary artery calcification. Aortic valve calcification. No central pulmonary embolism, on this non-dedicated study. Mediastinum/Nodes: 8 mm right paratracheal node is similar on 36/2 and not pathologic by size criteria. Upper normal right hilar nodal tissue is contiguous with radiation change and not significantly changed. No left hilar adenopathy. Lungs/Pleura: No pleural fluid. The right-sided pneumothorax has resolved. An apical bleb remains. Similar appearance of central right upper lobe and right apical consolidation with volume loss and traction bronchiectasis. Upper Abdomen: Normal imaged portions of the liver, spleen, stomach, pancreas, gallbladder, adrenal glands. Too small to characterize upper pole right renal lesion. Exophytic upper pole left renal low-density lesion of 4.2 cm on 134/2 is similar in size where imaged compared to the prior. Musculoskeletal: Healing fourth through sixth posterolateral right rib fractures again identified. Accentuation of expected thoracic kyphosis with mild convex right thoracic spine curvature. IMPRESSION: 1. Right upper lobe and apical radiation induced  consolidation, without recurrent or metastatic disease. 2. Resolution of previously described right apical pneumothorax. 3. Coronary artery atherosclerosis. Aortic Atherosclerosis (ICD10-I70.0). 4. Similar size of small right paratracheal node. The previously described right gastric node is no longer identified. 5. Aortic valvular calcifications. Consider echocardiography to evaluate for valvular dysfunction. Electronically Signed   By: KAbigail MiyamotoM.D.   On: 12/07/2020 11:51     ASSESSMENT AND PLAN: This is a very pleasant 65years old [name] with stage IIIB non-small cell lung cancer, adenocarcinoma.   The patient underwent a course of concurrent chemoradiation with weekly carboplatin and paclitaxel status post 7 cycles.  She tolerated this course of treatment fairly well with no concerning complaints.   The patient completed treatment with consolidation immunotherapy with Imfinzi (Durvalumab) 10 mg/KG every 2 weeks status post 26 cycles. She is currently on observation and she is feeling fine today with no concerning complaints. The patient had repeat CT scan of the chest performed recently.  I personally and independently reviewed the scans and discussed the results with the patient today. Her scan showed no concerning findings for disease progression. I recommended for her to continue on observation with repeat CT scan of the chest in 6 months. For the hypothyroidism, she will continue to follow-up with her primary care physician for close monitoring. She was advised to call immediately if she has any other concerning symptoms in the interval. The patient voices  understanding of current disease status and treatment options and is in agreement with the current care plan. All questions were answered. The patient knows to call the clinic with any problems, questions or concerns. We can certainly see the patient much sooner if necessary.  Disclaimer: This note was dictated with  voice recognition software. Similar sounding words can inadvertently be transcribed and may not be corrected upon review.

## 2021-06-07 ENCOUNTER — Other Ambulatory Visit

## 2021-06-07 ENCOUNTER — Telehealth: Payer: Self-pay | Admitting: Internal Medicine

## 2021-06-07 ENCOUNTER — Ambulatory Visit (HOSPITAL_COMMUNITY)
Admission: RE | Admit: 2021-06-07 | Discharge: 2021-06-07 | Disposition: A | Discharge status: Home / Self Care | Payer: Medicare Other | Source: Ambulatory Visit | Attending: Internal Medicine | Admitting: Internal Medicine

## 2021-06-07 DIAGNOSIS — C3490 Malignant neoplasm of unspecified part of unspecified bronchus or lung: Secondary | ICD-10-CM | POA: Diagnosis present

## 2021-06-07 LAB — POCT I-STAT CREATININE: Creatinine, Ser: 1.3 mg/dL — ABNORMAL HIGH (ref 0.44–1.00)

## 2021-06-07 MED ORDER — SODIUM CHLORIDE (PF) 0.9 % IJ SOLN
INTRAMUSCULAR | Status: AC
  Filled 2021-06-07: qty 50

## 2021-06-07 MED ORDER — IOHEXOL 300 MG/ML  SOLN
100.0000 mL | Freq: Once | INTRAMUSCULAR | Status: AC | PRN
  Administered 2021-06-07: 75 mL via INTRAVENOUS

## 2021-06-07 NOTE — Telephone Encounter (Signed)
Called patient regarding upcoming appointment, patient is notified. °

## 2021-06-10 ENCOUNTER — Inpatient Hospital Stay: Payer: Medicare Other | Attending: Internal Medicine | Admitting: Internal Medicine

## 2021-06-10 ENCOUNTER — Other Ambulatory Visit: Payer: Self-pay

## 2021-06-10 VITALS — BP 148/75 | HR 75 | Resp 15 | Wt 207.6 lb

## 2021-06-10 DIAGNOSIS — C3411 Malignant neoplasm of upper lobe, right bronchus or lung: Secondary | ICD-10-CM | POA: Diagnosis not present

## 2021-06-10 DIAGNOSIS — Z923 Personal history of irradiation: Secondary | ICD-10-CM | POA: Insufficient documentation

## 2021-06-10 DIAGNOSIS — Z85118 Personal history of other malignant neoplasm of bronchus and lung: Secondary | ICD-10-CM | POA: Insufficient documentation

## 2021-06-10 DIAGNOSIS — C3490 Malignant neoplasm of unspecified part of unspecified bronchus or lung: Secondary | ICD-10-CM

## 2021-06-10 DIAGNOSIS — Z9221 Personal history of antineoplastic chemotherapy: Secondary | ICD-10-CM | POA: Insufficient documentation

## 2021-06-10 NOTE — Progress Notes (Signed)
?    [place] ?Telephone:(336) [phone]   [phone] ? ?OFFICE PROGRESS NOTE ? ?[name], MD ?[address] ?[place] [address] ? ?DIAGNOSIS: Stage IIIB (T3, N2, M0) non-small cell lung cancer, adenocarcinoma diagnosed in March 2019 and presented with large right upper lobe lung mass in addition to right hilar and mediastinal lymphadenopathy. ?  ?PD-L1 0% ?  ?PRIOR THERAPY:  ?1) Concurrent chemoradiation with weekly carboplatin for an AUC of 2 and paclitaxel 45 mg/m2.  First dose expected on 06/01/2017.  Status post 7 cycles.  Last dose was given 07/14/2017. ?2) Consolidation treatment with immunotherapy with Imfinzi (Durvalumab) 10 mg/KG every 2 weeks status post 26 cycles. ?  ?CURRENT THERAPY:  Observation. ? ?INTERVAL HISTORY: ?Ashley Wilson 66 y.o. female returns to the clinic today for 6 months follow-up visit.  The patient is feeling fine today with no concerning complaints.  She denied having any current chest pain, shortness of breath, cough or hemoptysis.  She has no nausea, vomiting, diarrhea or constipation.  She has no headache or visual changes.  She denied having any recent weight loss or night sweats.  The patient is here today for evaluation with repeat CT scan of the chest for restaging of her disease. ? ?  ?MEDICAL HISTORY: ?Past Medical History:  ?Diagnosis Date  ? Compression fracture of lumbar vertebra (HCC)   ? Hypertension   ? NSCL ca dx'd 05/2017  ? ? ?ALLERGIES:  is allergic to lisinopril-hydrochlorothiazide. ? ?MEDICATIONS:  ?Current Outpatient Medications  ?Medication Sig Dispense Refill  ? acetaminophen (TYLENOL) 500 MG tablet Take 1,000 mg by mouth every 6 (six) hours as needed for mild pain or moderate pain. (Patient not taking: Reported on 12/10/2020)    ? spironolactone (ALDACTONE) 25 MG tablet Take 25 mg by mouth daily.    ? ?No current facility-administered medications for this visit.  ? ? ?SURGICAL HISTORY:  ?Past Surgical History:  ?Procedure  Laterality Date  ? BACK SURGERY  2009  ? ruptured disc  ? ? ?REVIEW OF SYSTEMS:  A comprehensive review of systems was negative.  ? ?PHYSICAL EXAMINATION: General appearance: alert, cooperative, and no distress ?Head: Normocephalic, without obvious abnormality, atraumatic ?Neck: no adenopathy, no JVD, supple, symmetrical, trachea midline, and thyroid not enlarged, symmetric, no tenderness/mass/nodules ?Lymph nodes: Cervical, supraclavicular, and axillary nodes normal. ?Resp: clear to auscultation bilaterally ?Back: symmetric, no curvature. ROM normal. No CVA tenderness. ?Cardio: regular rate and rhythm, S1, S2 normal, no murmur, click, rub or gallop ?GI: soft, non-tender; bowel sounds normal; no masses,  no organomegaly ?Extremities: extremities normal, atraumatic, no cyanosis or edema ? ?ECOG PERFORMANCE STATUS: 1 - Symptomatic but completely ambulatory ? ?Blood pressure (!) 148/75, pulse 75, resp. rate 15, weight 207 lb 9.6 oz (94.2 kg), SpO2 95 %. ? ?LABORATORY DATA: ?Lab Results  ?Component Value Date  ? WBC 6.7 12/07/2020  ? HGB 12.7 12/07/2020  ? HCT 39.2 12/07/2020  ? MCV 84.3 12/07/2020  ? PLT 299 12/07/2020  ? ? ?  Chemistry   ?   ?Component Value Date/Time  ? NA 141 12/07/2020 0758  ? K 4.3 12/07/2020 0758  ? CL 111 12/07/2020 0758  ? CO2 24 12/07/2020 0758  ? BUN 22 12/07/2020 0758  ? CREATININE 1.30 (H) 06/07/2021 1746  ? CREATININE 1.23 (H) 12/07/2020 0758  ?    ?Component Value Date/Time  ? CALCIUM 8.9 12/07/2020 0758  ? ALKPHOS 79 12/07/2020 0758  ? AST 13 (L) 12/07/2020  1093  ? ALT 12 12/07/2020 0758  ? BILITOT 0.5 12/07/2020 0758  ?  ? ? ? ?RADIOGRAPHIC STUDIES: ?CT Chest W Contrast ? ?Result Date: 06/09/2021 ?CLINICAL DATA:  66 year old female with history of non-small cell lung cancer. * Tracking Code: BO * EXAM: CT CHEST WITH CONTRAST TECHNIQUE: Multidetector CT imaging of the chest was performed during intravenous contrast administration. RADIATION DOSE REDUCTION: This exam was performed  according to the departmental dose-optimization program which includes automated exposure control, adjustment of the mA and/or kV according to patient size and/or use of iterative reconstruction technique. CONTRAST:  33m OMNIPAQUE IOHEXOL 300 MG/ML  SOLN COMPARISON:  Chest CT 12/07/2020. FINDINGS: Cardiovascular: Heart size is normal. There is no significant pericardial fluid, thickening or pericardial calcification. There is aortic atherosclerosis, as well as atherosclerosis of the great vessels of the mediastinum and the coronary arteries, including calcified atherosclerotic plaque in the left main, left anterior descending, left circumflex and right coronary arteries. Thickening and calcification of the aortic valve. Calcifications of the mitral annulus. Mediastinum/Nodes: No pathologically enlarged mediastinal or hilar lymph nodes. Esophagus is unremarkable in appearance. No axillary lymphadenopathy. Lungs/Pleura: Chronic mass-like architectural distortion and volume loss in the apex of the right lung, predominantly involving the right upper lobe and portions of the superior segment of the right lower lobe, stable compared to prior examinations, most compatible with an area of chronic postradiation mass-like fibrosis. 3 mm right middle lobe pulmonary nodule (axial image 50 of series 7), stable compared to prior studies, likely benign. No other suspicious appearing pulmonary nodules or masses are noted. No acute consolidative airspace disease. No pleural effusions. Upper Abdomen: Aortic atherosclerosis. Incompletely imaged cystic appearing lesion in the upper left retroperitoneum corresponds to large cystic lesion noted in the anterior aspect of the left kidney on prior PET-CT 05/21/2017. Musculoskeletal: There are no aggressive appearing lytic or blastic lesions noted in the visualized portions of the skeleton. Old healed fractures of the posterolateral aspect of the right fourth, fifth and sixth ribs.  IMPRESSION: 1. Stable chronic postradiation changes of mass-like fibrosis in the apex of the right lung, without evidence to suggest local recurrence of disease or definite metastatic disease in the thorax. 2. Aortic atherosclerosis, in addition to left main and three-vessel coronary artery disease. Please note that although the presence of coronary artery calcium documents the presence of coronary artery disease, the severity of this disease and any potential stenosis cannot be assessed on this non-gated CT examination. Assessment for potential risk factor modification, dietary therapy or pharmacologic therapy may be warranted, if clinically indicated. 3. There are calcifications of the aortic valve and mitral annulus. Echocardiographic correlation for evaluation of potential valvular dysfunction may be warranted if clinically indicated. 4. Additional incidental findings, as above. Aortic Atherosclerosis (ICD10-I70.0). Electronically Signed   By: DVinnie LangtonM.D.   On: 06/09/2021 09:25   ? ? ?ASSESSMENT AND PLAN: This is a very pleasant 66years old [name] with stage IIIB non-small cell lung cancer, adenocarcinoma.   ?The patient underwent a course of concurrent chemoradiation with weekly carboplatin and paclitaxel status post 7 cycles.  She tolerated this course of treatment fairly well with no concerning complaints.   ?The patient completed treatment with consolidation immunotherapy with Imfinzi (Durvalumab) 10 mg/KG every 2 weeks status post 26 cycles. ?The patient has been in observation now for several years.  She is doing fine with no concerning complaints. ?She had repeat CT scan of the chest performed recently.  I personally and independently  reviewed the scans and discussed the results with the patient today. ?Her scan showed no concerning findings for disease progression. ?I recommended for her to continue on observation with repeat CT scan of the chest in 6 months. ?The patient was  advised to call immediately if she has any concerning symptoms in the interval. ?The patient voices understanding of current disease status and treatment options and is in agreement with the current care plan. ?All questi

## 2021-08-08 ENCOUNTER — Other Ambulatory Visit: Payer: Self-pay | Admitting: Physician Assistant

## 2021-12-09 ENCOUNTER — Inpatient Hospital Stay: Payer: Medicare Other | Attending: Internal Medicine

## 2021-12-09 ENCOUNTER — Ambulatory Visit (HOSPITAL_COMMUNITY)
Admission: RE | Admit: 2021-12-09 | Discharge: 2021-12-09 | Disposition: A | Discharge status: Home / Self Care | Payer: Medicare Other | Source: Ambulatory Visit | Attending: Internal Medicine | Admitting: Internal Medicine

## 2021-12-09 DIAGNOSIS — C3411 Malignant neoplasm of upper lobe, right bronchus or lung: Secondary | ICD-10-CM | POA: Diagnosis present

## 2021-12-09 DIAGNOSIS — I2510 Atherosclerotic heart disease of native coronary artery without angina pectoris: Secondary | ICD-10-CM | POA: Insufficient documentation

## 2021-12-09 DIAGNOSIS — C3490 Malignant neoplasm of unspecified part of unspecified bronchus or lung: Secondary | ICD-10-CM

## 2021-12-09 DIAGNOSIS — Z923 Personal history of irradiation: Secondary | ICD-10-CM | POA: Insufficient documentation

## 2021-12-09 DIAGNOSIS — I10 Essential (primary) hypertension: Secondary | ICD-10-CM | POA: Diagnosis not present

## 2021-12-09 LAB — CMP (CANCER CENTER ONLY)
ALT: 13 U/L (ref 0–44)
AST: 13 U/L — ABNORMAL LOW (ref 15–41)
Albumin: 4 g/dL (ref 3.5–5.0)
Alkaline Phosphatase: 87 U/L (ref 38–126)
Anion gap: 8 (ref 5–15)
BUN: 23 mg/dL (ref 8–23)
CO2: 23 mmol/L (ref 22–32)
Calcium: 9.6 mg/dL (ref 8.9–10.3)
Chloride: 106 mmol/L (ref 98–111)
Creatinine: 1.2 mg/dL — ABNORMAL HIGH (ref 0.44–1.00)
GFR, Estimated: 49 mL/min — ABNORMAL LOW (ref >60)
Glucose, Bld: 138 mg/dL — ABNORMAL HIGH (ref 70–99)
Potassium: 3.9 mmol/L (ref 3.5–5.1)
Sodium: 137 mmol/L (ref 135–145)
Total Bilirubin: 0.4 mg/dL (ref 0.3–1.2)
Total Protein: 7.8 g/dL (ref 6.5–8.1)

## 2021-12-09 LAB — CBC WITH DIFFERENTIAL (CANCER CENTER ONLY)
Abs Immature Granulocytes: 0.06 10*3/uL (ref 0.00–0.07)
Basophils Absolute: 0.1 10*3/uL (ref 0.0–0.1)
Basophils Relative: 1 %
Eosinophils Absolute: 0.2 10*3/uL (ref 0.0–0.5)
Eosinophils Relative: 2 %
HCT: 38.2 % (ref 36.0–46.0)
Hemoglobin: 12.5 g/dL (ref 12.0–15.0)
Immature Granulocytes: 1 %
Lymphocytes Relative: 16 %
Lymphs Abs: 1.5 10*3/uL (ref 0.7–4.0)
MCH: 27.7 pg (ref 26.0–34.0)
MCHC: 32.7 g/dL (ref 30.0–36.0)
MCV: 84.5 fL (ref 80.0–100.0)
Monocytes Absolute: 0.8 10*3/uL (ref 0.1–1.0)
Monocytes Relative: 8 %
Neutro Abs: 6.8 10*3/uL (ref 1.7–7.7)
Neutrophils Relative %: 72 %
Platelet Count: 290 10*3/uL (ref 150–400)
RBC: 4.52 MIL/uL (ref 3.87–5.11)
RDW: 14.9 % (ref 11.5–15.5)
WBC Count: 9.4 10*3/uL (ref 4.0–10.5)
nRBC: 0 % (ref 0.0–0.2)

## 2021-12-09 MED ORDER — SODIUM CHLORIDE (PF) 0.9 % IJ SOLN
INTRAMUSCULAR | Status: AC
  Filled 2021-12-09: qty 50

## 2021-12-09 MED ORDER — IOHEXOL 300 MG/ML  SOLN
75.0000 mL | Freq: Once | INTRAMUSCULAR | Status: AC | PRN
  Administered 2021-12-09: 75 mL via INTRAVENOUS

## 2021-12-11 ENCOUNTER — Other Ambulatory Visit: Payer: Self-pay

## 2021-12-11 ENCOUNTER — Inpatient Hospital Stay (HOSPITAL_BASED_OUTPATIENT_CLINIC_OR_DEPARTMENT_OTHER): Payer: Medicare Other | Admitting: Internal Medicine

## 2021-12-11 ENCOUNTER — Encounter: Payer: Self-pay | Admitting: Internal Medicine

## 2021-12-11 VITALS — BP 119/63 | HR 71 | Temp 98.5°F | Resp 16 | Ht 63.0 in | Wt 206.1 lb

## 2021-12-11 DIAGNOSIS — C3411 Malignant neoplasm of upper lobe, right bronchus or lung: Secondary | ICD-10-CM | POA: Diagnosis not present

## 2021-12-11 DIAGNOSIS — C3490 Malignant neoplasm of unspecified part of unspecified bronchus or lung: Secondary | ICD-10-CM | POA: Diagnosis not present

## 2021-12-11 NOTE — Progress Notes (Signed)
[place] Telephone:([phone]   Fax:([phone]  OFFICE PROGRESS NOTE  [name], MD [address] [place] [address]  DIAGNOSIS: Stage IIIB (T3, N2, M0) non-small cell lung cancer, adenocarcinoma diagnosed in March 2019 and presented with large right upper lobe lung mass in addition to right hilar and mediastinal lymphadenopathy.   PD-L1 0%   PRIOR THERAPY:  1) Concurrent chemoradiation with weekly carboplatin for an AUC of 2 and paclitaxel 45 mg/m2.  First dose expected on 06/01/2017.  Status post 7 cycles.  Last dose was given 07/14/2017. 2) Consolidation treatment with immunotherapy with Imfinzi (Durvalumab) 10 mg/KG every 2 weeks status post 26 cycles.   CURRENT THERAPY:  Observation.  INTERVAL HISTORY: Ashley Wilson 66 y.o. female returns to the clinic today for 6 months follow-up visit.  The patient is feeling fine today with no concerning complaints.  She denied having any chest pain, shortness of breath, cough or hemoptysis.  She has no nausea, vomiting, diarrhea or constipation.  She has no headache or visual changes.  She denied having any recent weight loss or night sweats.  She is here today for evaluation with repeat CT scan of the chest for restaging of her disease.   MEDICAL HISTORY: Past Medical History:  Diagnosis Date   Compression fracture of lumbar vertebra ([facility])    Hypertension    NSCL ca dx'd 05/2017    ALLERGIES:  is allergic to lisinopril-hydrochlorothiazide.  MEDICATIONS:  Current Outpatient Medications  Medication Sig Dispense Refill   rosuvastatin (CRESTOR) 10 MG tablet Take 10 mg by mouth daily.     acetaminophen (TYLENOL) 500 MG tablet Take 1,000 mg by mouth every 6 (six) hours as needed for mild pain or moderate pain. (Patient not taking: Reported on 12/10/2020)     amLODipine (NORVASC) 10 MG tablet Take 10 mg by mouth daily.     spironolactone (ALDACTONE) 25 MG tablet Take 25 mg by mouth daily.     No current  facility-administered medications for this visit.    SURGICAL HISTORY:  Past Surgical History:  Procedure Laterality Date   BACK SURGERY  2009   ruptured disc    REVIEW OF SYSTEMS:  A comprehensive review of systems was negative.   PHYSICAL EXAMINATION: General appearance: alert, cooperative, and no distress Head: Normocephalic, without obvious abnormality, atraumatic Neck: no adenopathy, no JVD, supple, symmetrical, trachea midline, and thyroid not enlarged, symmetric, no tenderness/mass/nodules Lymph nodes: Cervical, supraclavicular, and axillary nodes normal. Resp: clear to auscultation bilaterally Back: symmetric, no curvature. ROM normal. No CVA tenderness. Cardio: regular rate and rhythm, S1, S2 normal, no murmur, click, rub or gallop GI: soft, non-tender; bowel sounds normal; no masses,  no organomegaly Extremities: extremities normal, atraumatic, no cyanosis or edema  ECOG PERFORMANCE STATUS: 1 - Symptomatic but completely ambulatory  Blood pressure 119/63, pulse 71, temperature 98.5 F (36.9 C), temperature source Oral, resp. rate 16, height 5' 3"  (1.6 m), weight 206 lb 2 oz (93.5 kg), SpO2 99 %.  LABORATORY DATA: Lab Results  Component Value Date   WBC 9.4 12/09/2021   HGB 12.5 12/09/2021   HCT 38.2 12/09/2021   MCV 84.5 12/09/2021   PLT 290 12/09/2021      Chemistry      Component Value Date/Time   NA 137 12/09/2021 1003   K 3.9 12/09/2021 1003   CL 106 12/09/2021 1003   CO2 23 12/09/2021 1003   BUN 23 12/09/2021 1003  CREATININE 1.20 (H) 12/09/2021 1003      Component Value Date/Time   CALCIUM 9.6 12/09/2021 1003   ALKPHOS 87 12/09/2021 1003   AST 13 (L) 12/09/2021 1003   ALT 13 12/09/2021 1003   BILITOT 0.4 12/09/2021 1003       RADIOGRAPHIC STUDIES: CT Chest W Contrast  Result Date: 12/09/2021 CLINICAL DATA:  A 66 year old female presents for evaluation of non-small cell lung cancer, staging/follow-up assessment. * Tracking Code: BO * EXAM:  CT CHEST WITH CONTRAST TECHNIQUE: Multidetector CT imaging of the chest was performed during intravenous contrast administration. RADIATION DOSE REDUCTION: This exam was performed according to the departmental dose-optimization program which includes automated exposure control, adjustment of the mA and/or kV according to patient size and/or use of iterative reconstruction technique. CONTRAST:  39m OMNIPAQUE IOHEXOL 300 MG/ML  SOLN COMPARISON:  June 07, 2021 FINDINGS: Cardiovascular: Stable appearance of the heart great vessels with signs of coronary artery disease with calcification and calcified aortic atherosclerosis. Central pulmonary vasculature is of normal caliber. Mediastinum/Nodes: No thoracic inlet, axillary, mediastinal or hilar adenopathy. Esophagus grossly normal. RIGHT hilar distortion secondary to post treatment changes is unchanged. Lungs/Pleura: Consolidative process in the RIGHT upper lobe with associated bronchiectasis and scarring as well as volume loss with no change compatible with post treatment changes in the RIGHT upper lobe. Small cystic area adjacent to consolidative changes also unchanged and not associated with fluid or adjacent stranding. Airways are otherwise normal. 4 mm RIGHT upper lobe pulmonary nodule and 5 mm nodular area along the superior aspect of the major fissure both on image 52/5 areas show increasing conspicuity but no change in size since most recent comparison imaging and subtle increase since October of 2021. Upper Abdomen: Incidental imaging of upper abdominal contents with small hiatal hernia. No acute findings. Musculoskeletal: Signs of therapy related distortion of RIGHT-sided ribs without change. No chest wall mass. IMPRESSION: 1. Post therapy changes in the RIGHT upper lobe with volume loss and bronchiectasis. 2. 4 mm RIGHT upper lobe pulmonary nodule and 5 mm nodular area along the superior aspect of the major fissure both areas show increasing conspicuity but  no change in size since most recent comparison imaging and subtle increase since October of 2021. Attention on subsequent imaging. Consider short interval follow-up to exclude the possibility of indolent recurrence or metachronous primary. 3. Coronary artery disease and calcified aortic atherosclerosis. Aortic Atherosclerosis (ICD10-I70.0). Electronically Signed   By: GZetta BillsM.D.   On: 12/09/2021 15:43     ASSESSMENT AND PLAN: This is a very pleasant 66years old [name] with stage IIIB non-small cell lung cancer, adenocarcinoma.   The patient underwent a course of concurrent chemoradiation with weekly carboplatin and paclitaxel status post 7 cycles.  She tolerated this course of treatment fairly well with no concerning complaints.   The patient completed treatment with consolidation immunotherapy with Imfinzi (Durvalumab) 10 mg/KG every 2 weeks status post 26 cycles. She is currently on observation and she is feeling fine with no concerning complaints. She had repeat CT scan of the chest performed recently.  I personally and independently reviewed the scan and discussed the results with the patient today. Her scan showed no concerning findings for disease recurrence or metastasis but there was 0.4 and 0.5 right upper lobe pulmonary nodules that need close monitoring on the upcoming studies. I recommended for the patient to continue on observation with repeat CT scan of the chest in 6 months. She was advised to call immediately  if she has any other concerning symptoms in the interval. The patient voices understanding of current disease status and treatment options and is in agreement with the current care plan. All questions were answered. The patient knows to call the clinic with any problems, questions or concerns. We can certainly see the patient much sooner if necessary.  Disclaimer: This note was dictated with voice recognition software. Similar sounding words can  inadvertently be transcribed and may not be corrected upon review.

## 2022-04-30 ENCOUNTER — Telehealth: Payer: Self-pay | Admitting: Internal Medicine

## 2022-04-30 NOTE — Telephone Encounter (Signed)
Called patient regarding April appointments. Left a voicemail.

## 2022-05-06 ENCOUNTER — Telehealth: Payer: Self-pay | Admitting: Internal Medicine

## 2022-05-06 NOTE — Telephone Encounter (Signed)
Called patient regarding upcoming April appointments, patient is notified. 

## 2022-06-09 ENCOUNTER — Inpatient Hospital Stay: Payer: Medicare Other | Attending: Internal Medicine

## 2022-06-09 ENCOUNTER — Other Ambulatory Visit: Payer: Self-pay

## 2022-06-09 ENCOUNTER — Ambulatory Visit (HOSPITAL_COMMUNITY)
Admission: RE | Admit: 2022-06-09 | Discharge: 2022-06-09 | Disposition: A | Discharge status: Home / Self Care | Payer: Medicare Other | Source: Ambulatory Visit | Attending: Internal Medicine | Admitting: Internal Medicine

## 2022-06-09 DIAGNOSIS — C3490 Malignant neoplasm of unspecified part of unspecified bronchus or lung: Secondary | ICD-10-CM | POA: Insufficient documentation

## 2022-06-09 DIAGNOSIS — Z923 Personal history of irradiation: Secondary | ICD-10-CM | POA: Insufficient documentation

## 2022-06-09 DIAGNOSIS — C3411 Malignant neoplasm of upper lobe, right bronchus or lung: Secondary | ICD-10-CM | POA: Insufficient documentation

## 2022-06-09 LAB — CBC WITH DIFFERENTIAL (CANCER CENTER ONLY)
Abs Immature Granulocytes: 0.05 10*3/uL (ref 0.00–0.07)
Basophils Absolute: 0.1 10*3/uL (ref 0.0–0.1)
Basophils Relative: 1 %
Eosinophils Absolute: 0.2 10*3/uL (ref 0.0–0.5)
Eosinophils Relative: 3 %
HCT: 39.2 % (ref 36.0–46.0)
Hemoglobin: 12.5 g/dL (ref 12.0–15.0)
Immature Granulocytes: 1 %
Lymphocytes Relative: 22 %
Lymphs Abs: 1.6 10*3/uL (ref 0.7–4.0)
MCH: 27.4 pg (ref 26.0–34.0)
MCHC: 31.9 g/dL (ref 30.0–36.0)
MCV: 86 fL (ref 80.0–100.0)
Monocytes Absolute: 0.6 10*3/uL (ref 0.1–1.0)
Monocytes Relative: 9 %
Neutro Abs: 4.9 10*3/uL (ref 1.7–7.7)
Neutrophils Relative %: 64 %
Platelet Count: 281 10*3/uL (ref 150–400)
RBC: 4.56 MIL/uL (ref 3.87–5.11)
RDW: 14.8 % (ref 11.5–15.5)
WBC Count: 7.4 10*3/uL (ref 4.0–10.5)
nRBC: 0 % (ref 0.0–0.2)

## 2022-06-09 LAB — CMP (CANCER CENTER ONLY)
ALT: 13 U/L (ref 0–44)
AST: 14 U/L — ABNORMAL LOW (ref 15–41)
Albumin: 4 g/dL (ref 3.5–5.0)
Alkaline Phosphatase: 88 U/L (ref 38–126)
Anion gap: 8 (ref 5–15)
BUN: 22 mg/dL (ref 8–23)
CO2: 22 mmol/L (ref 22–32)
Calcium: 9.9 mg/dL (ref 8.9–10.3)
Chloride: 106 mmol/L (ref 98–111)
Creatinine: 1.32 mg/dL — ABNORMAL HIGH (ref 0.44–1.00)
GFR, Estimated: 44 mL/min — ABNORMAL LOW (ref >60)
Glucose, Bld: 130 mg/dL — ABNORMAL HIGH (ref 70–99)
Potassium: 5 mmol/L (ref 3.5–5.1)
Sodium: 136 mmol/L (ref 135–145)
Total Bilirubin: 0.3 mg/dL (ref 0.3–1.2)
Total Protein: 7.9 g/dL (ref 6.5–8.1)

## 2022-06-09 MED ORDER — IOHEXOL 300 MG/ML  SOLN
75.0000 mL | Freq: Once | INTRAMUSCULAR | Status: AC | PRN
  Administered 2022-06-09: 75 mL via INTRAVENOUS

## 2022-06-11 ENCOUNTER — Other Ambulatory Visit: Payer: Self-pay

## 2022-06-11 ENCOUNTER — Inpatient Hospital Stay (HOSPITAL_BASED_OUTPATIENT_CLINIC_OR_DEPARTMENT_OTHER): Payer: Medicare Other | Admitting: Internal Medicine

## 2022-06-11 VITALS — BP 132/75 | HR 87 | Temp 98.4°F | Resp 16 | Wt 200.8 lb

## 2022-06-11 DIAGNOSIS — C3490 Malignant neoplasm of unspecified part of unspecified bronchus or lung: Secondary | ICD-10-CM

## 2022-06-11 DIAGNOSIS — C3411 Malignant neoplasm of upper lobe, right bronchus or lung: Secondary | ICD-10-CM | POA: Diagnosis not present

## 2022-06-11 DIAGNOSIS — Z923 Personal history of irradiation: Secondary | ICD-10-CM | POA: Diagnosis not present

## 2022-06-11 NOTE — Progress Notes (Signed)
[facility] Telephone:(336) [phone]   Fax:([phone]  OFFICE PROGRESS NOTE  Renaye Rakers, MD [address] [place] [address]  DIAGNOSIS: Stage IIIB (T3, N2, M0) non-small cell lung cancer, adenocarcinoma diagnosed in March 2019 and presented with large right upper lobe lung mass in addition to right hilar and mediastinal lymphadenopathy.   PD-L1 0%   PRIOR THERAPY:  1) Concurrent chemoradiation with weekly carboplatin for an AUC of 2 and paclitaxel 45 mg/m2.  First dose expected on 06/01/2017.  Status post 7 cycles.  Last dose was given 07/14/2017. 2) Consolidation treatment with immunotherapy with Imfinzi (Durvalumab) 10 mg/KG every 2 weeks status post 26 cycles.   CURRENT THERAPY:  Observation.  INTERVAL HISTORY: Ashley Wilson 67 y.o. female returns to the clinic today for 6 months follow-up visit.  The patient is feeling fine today with no concerning complaints.  She denied having any current chest pain, shortness of breath, cough or hemoptysis.  She has no nausea, vomiting, diarrhea or constipation.  She has no headache or visual changes.  She intentionally lost around 6 pounds since her last visit.  She is here today for evaluation with repeat CT scan of the chest for restaging of her disease.   MEDICAL HISTORY: Past Medical History:  Diagnosis Date   Compression fracture of lumbar vertebra (HCC)    Hypertension    NSCL ca dx'd 05/2017    ALLERGIES:  is allergic to lisinopril-hydrochlorothiazide.  MEDICATIONS:  Current Outpatient Medications  Medication Sig Dispense Refill   acetaminophen (TYLENOL) 500 MG tablet Take 1,000 mg by mouth every 6 (six) hours as needed for mild pain or moderate pain. (Patient not taking: Reported on 12/10/2020)     amLODipine (NORVASC) 10 MG tablet Take 10 mg by mouth daily.     rosuvastatin (CRESTOR) 10 MG tablet Take 10 mg by mouth daily.     spironolactone (ALDACTONE) 25 MG tablet Take 25 mg by mouth daily.      No current facility-administered medications for this visit.    SURGICAL HISTORY:  Past Surgical History:  Procedure Laterality Date   BACK SURGERY  2009   ruptured disc    REVIEW OF SYSTEMS:  A comprehensive review of systems was negative.   PHYSICAL EXAMINATION: General appearance: alert, cooperative, and no distress Head: Normocephalic, without obvious abnormality, atraumatic Neck: no adenopathy, no JVD, supple, symmetrical, trachea midline, and thyroid not enlarged, symmetric, no tenderness/mass/nodules Lymph nodes: Cervical, supraclavicular, and axillary nodes normal. Resp: clear to auscultation bilaterally Back: symmetric, no curvature. ROM normal. No CVA tenderness. Cardio: regular rate and rhythm, S1, S2 normal, no murmur, click, rub or gallop GI: soft, non-tender; bowel sounds normal; no masses,  no organomegaly Extremities: extremities normal, atraumatic, no cyanosis or edema  ECOG PERFORMANCE STATUS: 1 - Symptomatic but completely ambulatory  Blood pressure 132/75, pulse 87, temperature 98.4 F (36.9 C), temperature source Oral, resp. rate 16, weight 200 lb 12.8 oz (91.1 kg), SpO2 97 %.  LABORATORY DATA: Lab Results  Component Value Date   WBC 7.4 06/09/2022   HGB 12.5 06/09/2022   HCT 39.2 06/09/2022   MCV 86.0 06/09/2022   PLT 281 06/09/2022      Chemistry      Component Value Date/Time   NA 136 06/09/2022 1556   K 5.0 06/09/2022 1556   CL 106 06/09/2022 1556   CO2 22 06/09/2022 1556   BUN 22 06/09/2022 1556   CREATININE 1.32 (H)  06/09/2022 1556      Component Value Date/Time   CALCIUM 9.9 06/09/2022 1556   ALKPHOS 88 06/09/2022 1556   AST 14 (L) 06/09/2022 1556   ALT 13 06/09/2022 1556   BILITOT 0.3 06/09/2022 1556       RADIOGRAPHIC STUDIES: No results found.   ASSESSMENT AND PLAN: This is a very pleasant 67 years old [name] with stage IIIB non-small cell lung cancer, adenocarcinoma.   The patient underwent a course of  concurrent chemoradiation with weekly carboplatin and paclitaxel status post 7 cycles.  She tolerated this course of treatment fairly well with no concerning complaints.   The patient completed treatment with consolidation immunotherapy with Imfinzi (Durvalumab) 10 mg/KG every 2 weeks status post 26 cycles. The patient has been on observation since that time and she is feeling fine with no concerning complaints. She had repeat CT scan of the chest performed 2 days ago but the final report is still pending.  I personally and independently reviewed the scan images and they do not see any concerning findings for disease progression but I will wait for the final report for confirmation. I recommended for her to continue on observation with repeat CT scan of the chest in 6 months. The patient was advised to call immediately if she has any other concerning symptoms in the interval. The patient voices understanding of current disease status and treatment options and is in agreement with the current care plan. All questions were answered. The patient knows to call the clinic with any problems, questions or concerns. We can certainly see the patient much sooner if necessary.  Disclaimer: This note was dictated with voice recognition software. Similar sounding words can inadvertently be transcribed and may not be corrected upon review.

## 2022-06-12 ENCOUNTER — Ambulatory Visit: Admitting: Internal Medicine

## 2022-08-08 ENCOUNTER — Telehealth: Payer: Self-pay | Admitting: Internal Medicine

## 2022-08-08 NOTE — Telephone Encounter (Signed)
Called patient regarding October appointments, left a voicemail.

## 2022-10-31 ENCOUNTER — Other Ambulatory Visit: Payer: Self-pay

## 2022-10-31 ENCOUNTER — Telehealth: Payer: Self-pay

## 2022-10-31 NOTE — Telephone Encounter (Signed)
Pt called and wanted yo know why her appoint that was schedule for 10/22 change to 10/21w/ Dr. Sofie Hartigan. Pt stated that she has already had arrange transportation with her children. She also stated that it would be impossible to make changes. I was able to send scheduling a message for her to be move to [facility] schedule which she has an opening  on 10/22 at 3:30 pm, she was happy to be back at that day and time. Pt verbalized understanding.    Rondel Jumbo, CMA

## 2022-12-05 ENCOUNTER — Ambulatory Visit (HOSPITAL_COMMUNITY)
Admission: RE | Admit: 2022-12-05 | Discharge: 2022-12-05 | Disposition: A | Discharge status: Home / Self Care | Payer: Medicare Other | Source: Ambulatory Visit | Attending: Internal Medicine | Admitting: Internal Medicine

## 2022-12-05 ENCOUNTER — Inpatient Hospital Stay: Payer: Medicare Other | Attending: Physician Assistant

## 2022-12-05 DIAGNOSIS — C3490 Malignant neoplasm of unspecified part of unspecified bronchus or lung: Secondary | ICD-10-CM | POA: Diagnosis present

## 2022-12-05 DIAGNOSIS — C3411 Malignant neoplasm of upper lobe, right bronchus or lung: Secondary | ICD-10-CM | POA: Insufficient documentation

## 2022-12-05 DIAGNOSIS — Z9221 Personal history of antineoplastic chemotherapy: Secondary | ICD-10-CM | POA: Insufficient documentation

## 2022-12-05 DIAGNOSIS — Z923 Personal history of irradiation: Secondary | ICD-10-CM | POA: Insufficient documentation

## 2022-12-05 LAB — CBC WITH DIFFERENTIAL (CANCER CENTER ONLY)
Abs Immature Granulocytes: 0.06 10*3/uL (ref 0.00–0.07)
Basophils Absolute: 0.1 10*3/uL (ref 0.0–0.1)
Basophils Relative: 1 %
Eosinophils Absolute: 0.1 10*3/uL (ref 0.0–0.5)
Eosinophils Relative: 1 %
HCT: 41.3 % (ref 36.0–46.0)
Hemoglobin: 13.1 g/dL (ref 12.0–15.0)
Immature Granulocytes: 1 %
Lymphocytes Relative: 17 %
Lymphs Abs: 1.4 10*3/uL (ref 0.7–4.0)
MCH: 27.2 pg (ref 26.0–34.0)
MCHC: 31.7 g/dL (ref 30.0–36.0)
MCV: 85.9 fL (ref 80.0–100.0)
Monocytes Absolute: 0.7 10*3/uL (ref 0.1–1.0)
Monocytes Relative: 9 %
Neutro Abs: 5.8 10*3/uL (ref 1.7–7.7)
Neutrophils Relative %: 71 %
Platelet Count: 304 10*3/uL (ref 150–400)
RBC: 4.81 MIL/uL (ref 3.87–5.11)
RDW: 15.1 % (ref 11.5–15.5)
WBC Count: 8.1 10*3/uL (ref 4.0–10.5)
nRBC: 0 % (ref 0.0–0.2)

## 2022-12-05 LAB — CMP (CANCER CENTER ONLY)
ALT: 14 U/L (ref 0–44)
AST: 16 U/L (ref 15–41)
Albumin: 4.2 g/dL (ref 3.5–5.0)
Alkaline Phosphatase: 86 U/L (ref 38–126)
Anion gap: 8 (ref 5–15)
BUN: 28 mg/dL — ABNORMAL HIGH (ref 8–23)
CO2: 23 mmol/L (ref 22–32)
Calcium: 10.3 mg/dL (ref 8.9–10.3)
Chloride: 107 mmol/L (ref 98–111)
Creatinine: 1.64 mg/dL — ABNORMAL HIGH (ref 0.44–1.00)
GFR, Estimated: 34 mL/min — ABNORMAL LOW (ref >60)
Glucose, Bld: 139 mg/dL — ABNORMAL HIGH (ref 70–99)
Potassium: 5.1 mmol/L (ref 3.5–5.1)
Sodium: 138 mmol/L (ref 135–145)
Total Bilirubin: 0.4 mg/dL (ref 0.3–1.2)
Total Protein: 8 g/dL (ref 6.5–8.1)

## 2022-12-05 MED ORDER — IOHEXOL 300 MG/ML  SOLN
75.0000 mL | Freq: Once | INTRAMUSCULAR | Status: DC | PRN
Start: 2022-12-05 — End: 2022-12-05

## 2022-12-05 MED ORDER — SODIUM CHLORIDE (PF) 0.9 % IJ SOLN
INTRAMUSCULAR | Status: AC
  Filled 2022-12-05: qty 50

## 2022-12-05 MED ORDER — IOHEXOL 300 MG/ML  SOLN
60.0000 mL | Freq: Once | INTRAMUSCULAR | Status: AC | PRN
  Administered 2022-12-05: 60 mL via INTRAVENOUS

## 2022-12-05 NOTE — Progress Notes (Unsigned)
[facility] OFFICE PROGRESS NOTE  Ashley Rakers, MD [address] [place] [address]  DIAGNOSIS:  Stage IIIB (T3, N2, M0) non-small cell lung cancer, adenocarcinoma diagnosed in March 2019 and presented with large right upper lobe lung mass in addition to right hilar and mediastinal lymphadenopathy.   PD-L1 0%  PRIOR THERAPY: 1) Concurrent chemoradiation with weekly carboplatin for an AUC of 2 and paclitaxel 45 mg/m2.  First dose expected on 06/01/2017.  Status post 7 cycles.  Last dose was given 07/14/2017. 2) Consolidation treatment with immunotherapy with Imfinzi (Durvalumab) 10 mg/KG every 2 weeks status post 26 cycles.  CURRENT THERAPY: Observation   INTERVAL HISTORY: Ashley Wilson 67 y.o. female returns to the clinic today for a 46-month follow up visit.  The patient was last seen by Dr. Arbutus Ped in April 2024.  She denies any major changes in her health since she was last seen.  She denies any fever, chills, or night sweats. Her breathing is at baseline with her dyspnea on exertion. She denies cough except if she lays down at night and has post nasal drainage. She uses halls. She denies any chest pain or hemoptysis.  Denies any nausea, vomiting, diarrhea, or constipation.  Denies any headache or visual changes.  She recently had a restaging CT scan performed.  She is here today for evaluation and to review her scan results.  MEDICAL HISTORY: Past Medical History:  Diagnosis Date   Compression fracture of lumbar vertebra (HCC)    Hypertension    NSCL ca dx'd 05/2017    ALLERGIES:  is allergic to lisinopril-hydrochlorothiazide.  MEDICATIONS:  Current Outpatient Medications  Medication Sig Dispense Refill   acetaminophen (TYLENOL) 500 MG tablet Take 1,000 mg by mouth every 6 (six) hours as needed for mild pain (pain score 1-3) or moderate pain (pain score 4-6).     amLODipine (NORVASC) 10 MG tablet Take 10 mg by mouth daily.     rosuvastatin (CRESTOR) 10 MG tablet  Take 10 mg by mouth daily.     spironolactone (ALDACTONE) 25 MG tablet Take 25 mg by mouth daily.     No current facility-administered medications for this visit.    SURGICAL HISTORY:  Past Surgical History:  Procedure Laterality Date   BACK SURGERY  2009   ruptured disc    REVIEW OF SYSTEMS:   Review of Systems  Constitutional: Negative for appetite change, chills, fatigue, fever and unexpected weight change.  HENT:   Negative for mouth sores, nosebleeds, sore throat and trouble swallowing.   Eyes: Negative for eye problems and icterus.  Respiratory: Positive for stable dyspnea on exertion. Mild cough at night if she has post nasal drainage. Negative for hemoptysis and wheezing.   Cardiovascular: Negative for chest pain. Bilateral lower extremity swelling (chronic). Gastrointestinal: Negative for abdominal pain, constipation, diarrhea, nausea and vomiting.  Genitourinary: Negative for bladder incontinence, difficulty urinating, dysuria, frequency and hematuria.   Musculoskeletal: Negative for back pain, gait problem, neck pain and neck stiffness.  Skin: Negative for itching and rash.  Neurological: Negative for dizziness, extremity weakness, gait problem, headaches, light-headedness and seizures.  Hematological: Negative for adenopathy. Does not bruise/bleed easily.  Psychiatric/Behavioral: Negative for confusion, depression and sleep disturbance. The patient is not nervous/anxious.     PHYSICAL EXAMINATION:  Blood pressure 135/76, pulse 85, temperature 98.3 F (36.8 C), temperature source Oral, resp. rate 18, weight 202 lb 12.8 oz (92 kg), SpO2 98%.  ECOG PERFORMANCE STATUS: 1  Physical Exam  Constitutional: Oriented to person, place, and time and well-developed, well-nourished, and in no distress.  HENT:  Head: Normocephalic and atraumatic.  Mouth/Throat: Oropharynx is clear and moist. No oropharyngeal exudate.  Eyes: Conjunctivae are normal. Right eye exhibits no  discharge. Left eye exhibits no discharge. No scleral icterus.  Neck: Normal range of motion. Neck supple.  Cardiovascular: Normal rate, regular rhythm, normal heart sounds and intact distal pulses.   Pulmonary/Chest: Effort normal and breath sounds normal. No respiratory distress. No wheezes. No rales.  Abdominal: Soft. Bowel sounds are normal. Exhibits no distension and no mass. There is no tenderness.  Musculoskeletal: Normal range of motion. Bilateral lower extremity swelling.  Lymphadenopathy:    No cervical adenopathy.  Neurological: Alert and oriented to person, place, and time. Exhibits normal muscle tone. Gait normal. Coordination normal.  Skin: Skin is warm and dry. No rash noted. Not diaphoretic. No erythema. No pallor.  Psychiatric: Mood, memory and judgment normal.  Vitals reviewed.  LABORATORY DATA: Lab Results  Component Value Date   WBC 8.1 12/05/2022   HGB 13.1 12/05/2022   HCT 41.3 12/05/2022   MCV 85.9 12/05/2022   PLT 304 12/05/2022      Chemistry      Component Value Date/Time   NA 138 12/05/2022 1535   K 5.1 12/05/2022 1535   CL 107 12/05/2022 1535   CO2 23 12/05/2022 1535   BUN 28 (H) 12/05/2022 1535   CREATININE 1.64 (H) 12/05/2022 1535      Component Value Date/Time   CALCIUM 10.3 12/05/2022 1535   ALKPHOS 86 12/05/2022 1535   AST 16 12/05/2022 1535   ALT 14 12/05/2022 1535   BILITOT 0.4 12/05/2022 1535       RADIOGRAPHIC STUDIES:  CT Chest W Contrast  Result Date: 12/09/2022 CLINICAL DATA:  Non-small-cell lung cancer. Restaging. * Tracking Code: BO * EXAM: CT CHEST WITH CONTRAST TECHNIQUE: Multidetector CT imaging of the chest was performed during intravenous contrast administration. RADIATION DOSE REDUCTION: This exam was performed according to the departmental dose-optimization program which includes automated exposure control, adjustment of the mA and/or kV according to patient size and/or use of iterative reconstruction technique.  CONTRAST:  60mL OMNIPAQUE IOHEXOL 300 MG/ML  SOLN COMPARISON:  06/09/2022. FINDINGS: Cardiovascular: The heart size is normal. No substantial pericardial effusion. Mild atherosclerotic calcification is noted in the wall of the thoracic aorta. Coronary artery calcification is evident. Mediastinum/Nodes: Stable 7 mm short axis right paratracheal node. There is no hilar lymphadenopathy. The esophagus has normal imaging features. Tiny hiatal hernia noted. There is no axillary lymphadenopathy. Lungs/Pleura: Post radiation scarring in the suprahilar right lung is stable in the interval, extending up into the right apex stable 5 mm peripheral right lung nodule identified previously is stable on 52/6. 4 mm subpleural nodule adjacent to the major fissure of the right lung identified on the prior study is stable on 48/6 today. No new suspicious pulmonary nodule or mass. No focal airspace consolidation. No pleural effusion. Upper Abdomen: Visualized portion of the upper abdomen is unremarkable. Musculoskeletal: No worrisome lytic or sclerotic osseous abnormality. IMPRESSION: 1. Stable exam. No new or progressive findings to suggest recurrent or metastatic disease. 2. Stable post radiation scarring in the suprahilar right lung. 3. Stable small right lung nodules. 4.  Aortic Atherosclerosis (ICD10-I70.0). Electronically Signed   By: Kennith Center M.D.   On: 12/09/2022 10:58     ASSESSMENT/PLAN:  This is a very pleasant 67 year old [name] with a history of stage IIIb non-small  cell lung cancer, adenocarcinoma. She presented with a large left upper lobe mass in addition to right hilar and mediastinal lymphadenopathy. She was diagnosed in March 2019.   She completed 7 cycles of concurrent chemoradiation with weekly carboplatin for an AUC of 2 and paclitaxel 45 mg/m. She then completed 26 cycles of consolidation immunotherapy. She has been on observation.   The patient was seen with Dr. Arbutus Ped today.  Dr.  Arbutus Ped personally and independently reviewed the scan and discussed results with the patient today.  The scan showed no evidence of disease progression.  Dr. Arbutus Ped recommends she continue on observation with repeat imaging in 6 months.   I will arrange for restaging CT scan of the chest and 6 months. If her next scan is stable, Dr. Arbutus Ped will see her back annually.  I ordered her scan with IV contrast but I would like for her to have repeat labs prior to her scan. If her creatinine is elevated (~>1.5) at the time of her CT, I have written a note in the comments of the order that it is ok to hold the IV contrast if needed.   We will see her back for follow-up visit 7 to 10 days after her scan to review the results in the office  The patient was advised to call immediately if she has any concerning symptoms in the interval. The patient voices understanding of current disease status and treatment options and is in agreement with the current care plan. All questions were answered. The patient knows to call the clinic with any problems, questions or concerns. We can certainly see the patient much sooner if necessary  Orders Placed This Encounter  Procedures   CT Chest W Contrast    OK to hold IV contrast if creatinine is elevated around time of the scan    Standing Status:   Future    Standing Expiration Date:   12/09/2023    Order Specific Question:   If indicated for the ordered procedure, I authorize the administration of contrast media per Radiology protocol    Answer:   Yes    Order Specific Question:   Does the patient have a contrast media/X-ray dye allergy?    Answer:   No    Order Specific Question:   Preferred imaging location?    Answer:   [facility]   CBC with Differential (Cancer Center Only)    Standing Status:   Future    Standing Expiration Date:   12/09/2023   CMP (Cancer Center only)    Standing Status:   Future    Standing Expiration Date:   12/09/2023      Ashley Abraham Emanuel Campos, PA-C 12/09/22  ADDENDUM: Hematology/Oncology Attending: I had a face-to-face encounter with the patient today.  I reviewed her records, lab, scan and recommended her care plan.  This is a very pleasant 67 years old [name] diagnosed with a stage IIIb non-small cell lung cancer, adenocarcinoma in March 2019 status post a course of concurrent chemoradiation followed by consolidation immunotherapy with Imfinzi for 1 year.  The patient has been in observation since May 2020 and she is feeling fine. She had repeat CT scan of the chest performed recently.  I personally and independently reviewed the scan and discussed the result with the patient today. Her scan showed no concerning findings for disease progression. I recommended for her to continue on observation with repeat CT scan of the chest in 6 months. The patient was advised  to call immediately if she has any other concerning symptoms in the interval. The total time spent in the appointment was 20 minutes. Disclaimer: This note was dictated with voice recognition software. Similar sounding words can inadvertently be transcribed and may be missed upon review. Lajuana Matte, MD

## 2022-12-08 ENCOUNTER — Ambulatory Visit: Admitting: Internal Medicine

## 2022-12-09 ENCOUNTER — Inpatient Hospital Stay (HOSPITAL_BASED_OUTPATIENT_CLINIC_OR_DEPARTMENT_OTHER): Payer: Medicare Other | Admitting: Physician Assistant

## 2022-12-09 ENCOUNTER — Ambulatory Visit: Admitting: Internal Medicine

## 2022-12-09 ENCOUNTER — Other Ambulatory Visit: Payer: Self-pay

## 2022-12-09 VITALS — BP 135/76 | HR 85 | Temp 98.3°F | Resp 18 | Wt 202.8 lb

## 2022-12-09 DIAGNOSIS — Z7189 Other specified counseling: Secondary | ICD-10-CM

## 2022-12-09 DIAGNOSIS — Z923 Personal history of irradiation: Secondary | ICD-10-CM | POA: Diagnosis not present

## 2022-12-09 DIAGNOSIS — C3411 Malignant neoplasm of upper lobe, right bronchus or lung: Secondary | ICD-10-CM

## 2022-12-09 DIAGNOSIS — Z9221 Personal history of antineoplastic chemotherapy: Secondary | ICD-10-CM | POA: Diagnosis not present

## 2023-05-20 ENCOUNTER — Telehealth: Payer: Self-pay | Admitting: Physician Assistant

## 2023-05-20 NOTE — Telephone Encounter (Signed)
 Rescheduled appointments per provider on PAL. The patient is aware of the appointment changes and will be mailed an appointment details.

## 2023-05-26 ENCOUNTER — Ambulatory Visit (HOSPITAL_COMMUNITY)
Admission: RE | Admit: 2023-05-26 | Discharge: 2023-05-26 | Disposition: A | Discharge status: Home / Self Care | Source: Ambulatory Visit | Attending: Physician Assistant | Admitting: Physician Assistant

## 2023-05-26 ENCOUNTER — Inpatient Hospital Stay: Attending: Physician Assistant

## 2023-05-26 DIAGNOSIS — C3411 Malignant neoplasm of upper lobe, right bronchus or lung: Secondary | ICD-10-CM

## 2023-05-26 LAB — CBC WITH DIFFERENTIAL (CANCER CENTER ONLY)
Abs Immature Granulocytes: 0.04 10*3/uL (ref 0.00–0.07)
Basophils Absolute: 0.1 10*3/uL (ref 0.0–0.1)
Basophils Relative: 1 %
Eosinophils Absolute: 0.1 10*3/uL (ref 0.0–0.5)
Eosinophils Relative: 2 %
HCT: 39.8 % (ref 36.0–46.0)
Hemoglobin: 12.7 g/dL (ref 12.0–15.0)
Immature Granulocytes: 1 %
Lymphocytes Relative: 17 %
Lymphs Abs: 1.3 10*3/uL (ref 0.7–4.0)
MCH: 26.8 pg (ref 26.0–34.0)
MCHC: 31.9 g/dL (ref 30.0–36.0)
MCV: 84 fL (ref 80.0–100.0)
Monocytes Absolute: 0.7 10*3/uL (ref 0.1–1.0)
Monocytes Relative: 9 %
Neutro Abs: 5.2 10*3/uL (ref 1.7–7.7)
Neutrophils Relative %: 70 %
Platelet Count: 285 10*3/uL (ref 150–400)
RBC: 4.74 MIL/uL (ref 3.87–5.11)
RDW: 14.9 % (ref 11.5–15.5)
WBC Count: 7.3 10*3/uL (ref 4.0–10.5)
nRBC: 0 % (ref 0.0–0.2)

## 2023-05-26 LAB — CMP (CANCER CENTER ONLY)
ALT: 11 U/L (ref 0–44)
AST: 16 U/L (ref 15–41)
Albumin: 4.2 g/dL (ref 3.5–5.0)
Alkaline Phosphatase: 86 U/L (ref 38–126)
Anion gap: 7 (ref 5–15)
BUN: 15 mg/dL (ref 8–23)
CO2: 24 mmol/L (ref 22–32)
Calcium: 9.9 mg/dL (ref 8.9–10.3)
Chloride: 106 mmol/L (ref 98–111)
Creatinine: 1.09 mg/dL — ABNORMAL HIGH (ref 0.44–1.00)
GFR, Estimated: 55 mL/min — ABNORMAL LOW (ref >60)
Glucose, Bld: 133 mg/dL — ABNORMAL HIGH (ref 70–99)
Potassium: 4.7 mmol/L (ref 3.5–5.1)
Sodium: 137 mmol/L (ref 135–145)
Total Bilirubin: 0.4 mg/dL (ref 0.0–1.2)
Total Protein: 8 g/dL (ref 6.5–8.1)

## 2023-05-26 MED ORDER — IOHEXOL 300 MG/ML  SOLN
75.0000 mL | Freq: Once | INTRAMUSCULAR | Status: AC | PRN
  Administered 2023-05-26: 75 mL via INTRAVENOUS

## 2023-06-09 ENCOUNTER — Ambulatory Visit: Admitting: Internal Medicine

## 2023-06-13 NOTE — Progress Notes (Unsigned)
 [facility] OFFICE PROGRESS NOTE  Jonathon Neighbors, MD [address] [place] [address]  DIAGNOSIS:  Stage IIIB (T3, N2, M0) non-small cell lung cancer, adenocarcinoma diagnosed in March 2019 and presented with large right upper lobe lung mass in addition to right hilar and mediastinal lymphadenopathy.   PD-L1 0%  PRIOR THERAPY: 1) Concurrent chemoradiation with weekly carboplatin  for an AUC of 2 and paclitaxel  45 mg/m2.  First dose expected on 06/01/2017.  Status post 7 cycles.  Last dose was given 07/14/2017. 2) Consolidation treatment with immunotherapy with Imfinzi  (Durvalumab ) 10 mg/KG every 2 weeks status post 26 cycles.  CURRENT THERAPY: Observation   INTERVAL HISTORY: PATTI SYLVEST 68 y.o. female returns to the clinic today for a 77-month follow up visit. The patient was last seen by Dr. Marguerita Shih in October 2024.  She denies any major changes in her health since she was last seen. She is wondering how she knows if she is diabetic. She states she was told she was prediabetic and was wanted to be on metformin but she decline. She believes her weight contributes to her blood sugar levels and maintains a diet including vegetables, fruits, and water, while avoiding fast food and sodas. She is physically active, taking care of her grandson and engaging in household chores, but expresses frustration that her weight does not decrease. She denies any fever, chills, or night sweats. Her breathing is at baseline. She denies dyspnea but states she gets tired if she overexerts. She denies cough except if she lays down at night and has post nasal drainage. She denies any chest pain or hemoptysis.  Denies any nausea, vomiting, diarrhea, or constipation.  Denies any headache or visual changes.  She recently had a restaging CT scan performed.  She is here today for evaluation and to review her scan results.     MEDICAL HISTORY: Past Medical History:  Diagnosis Date   Compression fracture of  lumbar vertebra (HCC)    Hypertension    NSCL ca dx'd 05/2017    ALLERGIES:  is allergic to lisinopril-hydrochlorothiazide .  MEDICATIONS:  Current Outpatient Medications  Medication Sig Dispense Refill   acetaminophen (TYLENOL) 500 MG tablet Take 1,000 mg by mouth every 6 (six) hours as needed for mild pain (pain score 1-3) or moderate pain (pain score 4-6).     amLODipine (NORVASC) 10 MG tablet Take 10 mg by mouth daily.     rosuvastatin (CRESTOR) 10 MG tablet Take 10 mg by mouth daily.     spironolactone (ALDACTONE) 25 MG tablet Take 25 mg by mouth daily.     No current facility-administered medications for this visit.    SURGICAL HISTORY:  Past Surgical History:  Procedure Laterality Date   BACK SURGERY  2009   ruptured disc    REVIEW OF SYSTEMS:   Review of Systems  Constitutional: Negative for appetite change, chills, fatigue, fever and unexpected weight change.  HENT: Negative for mouth sores, nosebleeds, sore throat and trouble swallowing.   Eyes: Negative for eye problems and icterus.  Respiratory: Positive for cough at night. Negative for hemoptysis, shortness of breath and wheezing.   Cardiovascular: Negative for chest pain and leg swelling.  Gastrointestinal: Negative for abdominal pain, constipation, diarrhea, nausea and vomiting.  Genitourinary: Negative for bladder incontinence, difficulty urinating, dysuria, frequency and hematuria.   Musculoskeletal: Negative for back pain, gait problem, neck pain and neck stiffness.  Skin: Negative for itching and rash.  Neurological: Negative for dizziness, extremity weakness,  gait problem, headaches, light-headedness and seizures.  Hematological: Negative for adenopathy. Does not bruise/bleed easily.  Psychiatric/Behavioral: Negative for confusion, depression and sleep disturbance. The patient is not nervous/anxious.     PHYSICAL EXAMINATION:  Blood pressure 138/65, pulse 81, temperature 98 F (36.7 C), temperature source  Temporal, resp. rate 15, weight 203 lb 14.4 oz (92.5 kg), SpO2 96%.  ECOG PERFORMANCE STATUS: 1  Physical Exam  Constitutional: Oriented to person, place, and time and well-developed, well-nourished, and in no distress.  HENT:  Head: Normocephalic and atraumatic.  Mouth/Throat: Oropharynx is clear and moist. No oropharyngeal exudate.  Eyes: Conjunctivae are normal. Right eye exhibits no discharge. Left eye exhibits no discharge. No scleral icterus.  Neck: Normal range of motion. Neck supple.  Cardiovascular: Normal rate, regular rhythm, normal heart sounds and intact distal pulses.   Pulmonary/Chest: Effort normal and breath sounds normal. No respiratory distress. No wheezes. No rales.  Abdominal: Soft. Bowel sounds are normal. Exhibits no distension and no mass. There is no tenderness.  Musculoskeletal: Normal range of motion. Exhibits no edema.  Lymphadenopathy:    No cervical adenopathy.  Neurological: Alert and oriented to person, place, and time. Exhibits normal muscle tone. Gait normal. Coordination normal.  Skin: Skin is warm and dry. No rash noted. Not diaphoretic. No erythema. No pallor.  Psychiatric: Mood, memory and judgment normal.  Vitals reviewed.  LABORATORY DATA: Lab Results  Component Value Date   WBC 7.3 05/26/2023   HGB 12.7 05/26/2023   HCT 39.8 05/26/2023   MCV 84.0 05/26/2023   PLT 285 05/26/2023      Chemistry      Component Value Date/Time   NA 137 05/26/2023 1530   K 4.7 05/26/2023 1530   CL 106 05/26/2023 1530   CO2 24 05/26/2023 1530   BUN 15 05/26/2023 1530   CREATININE 1.09 (H) 05/26/2023 1530      Component Value Date/Time   CALCIUM 9.9 05/26/2023 1530   ALKPHOS 86 05/26/2023 1530   AST 16 05/26/2023 1530   ALT 11 05/26/2023 1530   BILITOT 0.4 05/26/2023 1530       RADIOGRAPHIC STUDIES:  CT Chest W Contrast Result Date: 06/13/2023 CLINICAL DATA:  Assess treatment response non-small-cell lung cancer. * Tracking Code: BO * EXAM: CT  CHEST WITH CONTRAST TECHNIQUE: Multidetector CT imaging of the chest was performed during intravenous contrast administration. RADIATION DOSE REDUCTION: This exam was performed according to the departmental dose-optimization program which includes automated exposure control, adjustment of the mA and/or kV according to patient size and/or use of iterative reconstruction technique. CONTRAST:  75mL OMNIPAQUE  IOHEXOL  300 MG/ML  SOLN COMPARISON:  12/05/2022. FINDINGS: Cardiovascular: Heart is nonenlarged. No significant pericardial effusion. Coronary artery calcifications are seen. The thoracic aorta is normal course and caliber with some scattered partially calcified plaque. Mediastinum/Nodes: Small calcification along the left thyroid  lobe, unchanged from previous. Small hiatal hernia. No specific abnormal lymph node enlargement identified in the axillary regions, left hilum. Right paratracheal node which measured 7 mm in short axis on the previous examination, today measures 8 mm, not significantly changed when adjusted for technique. Left lower paratracheal node is also stable measuring 8 mm. Small right hilar node measures 8 mm in short axis on image 51 of series 2 is also stable. Lungs/Pleura: Breathing motion. Centrilobular emphysematous changes are identified. Left lung has some bandlike areas of opacity along the lingula and lower lobe likely scar or atelectasis, unchanged from previous. No left-sided consolidation, pneumothorax or effusion. No new dominant  left-sided nodule. On the right side once again there is confluent opacity with volume loss, bronchiectasis and some distortion of the right lung apex with a anterior bleb. Associated lateral pleural thickening. The opacities involve the right hilum. The extent and distribution of the opacity is similar to the previous examination associated narrowing of pulmonary vessels as well in the area of opacity. Volume loss and retraction. Stable 6 mm nodule  juxtapleural more caudal in the right upper lobe on series 5, image 55. Upper Abdomen: Adrenal glands are preserved in the upper abdomen. Musculoskeletal: Slight curvature of the spine with some degenerative changes. Stable deformity of multiple right-sided ribs. IMPRESSION: Overall no significant interval change. Stable confluent opacity with distortion and volume loss at the right lung apex. Stable small right-sided lung nodule. Stable small mediastinal and right hilar lymph nodes. Aortic Atherosclerosis (ICD10-I70.0). Electronically Signed   By: Adrianna Horde M.D.   On: 06/13/2023 11:43     ASSESSMENT/PLAN:  This is a very pleasant 68 year old [name] with a history of stage IIIb non-small cell lung cancer, adenocarcinoma. She presented with a large left upper lobe mass in addition to right hilar and mediastinal lymphadenopathy. She was diagnosed in March 2019.    She completed 7 cycles of concurrent chemoradiation with weekly carboplatin  for an AUC of 2 and paclitaxel  45 mg/m. She then completed 26 cycles of consolidation immunotherapy. She has been on observation.    The patient was seen with Dr. Marguerita Shih today.  Dr. Marguerita Shih personally and independently reviewed the scan and discussed results with the patient today.  The scan showed no evidence of disease progression.  Dr. Marguerita Shih recommends she continue on observation with repeat imaging in 12 months.    I will arrange for restaging CT scan of the chest in 12 months. I ordered her scan with IV contrast but I would like for her to have repeat labs prior to her scan. If her creatinine is elevated (~>1.5) at the time of her CT.    We will see her back for follow-up visit 7 to 10 days after her scan to review the results in the office  Her blood sugar on her CMP was 133 and the patient claims this is fasting.  I let the patient know that would strongly encourage her to see her PCP for A1c testing as she may be in the  prediabetic/diabetic range based on this.  We discussed weight loss, exercise and the importance of A1c.   The patient was advised to call immediately if she has any concerning symptoms in the interval. The patient voices understanding of current disease status and treatment options and is in agreement with the current care plan. All questions were answered. The patient knows to call the clinic with any problems, questions or concerns. We can certainly see the patient much sooner if necessary    Orders Placed This Encounter  Procedures   CT Chest W Contrast    Standing Status:   Future    Expected Date:   05/30/2024    Expiration Date:   06/15/2024    If indicated for the ordered procedure, I authorize the administration of contrast media per Radiology protocol:   Yes    Does the patient have a contrast media/X-ray dye allergy?:   No    Preferred imaging location?:   [facility]   CBC with Differential (Cancer Center Only)    Standing Status:   Future    Expected Date:   05/24/2024  Expiration Date:   06/15/2024   CMP (Cancer Center only)    Standing Status:   Future    Expected Date:   05/24/2024    Expiration Date:   06/15/2024      Leita Purdue Dontavia Brand, PA-C 06/16/23  ADDENDUM: Hematology/Oncology Attending: I had a face-to-face encounter with the patient today.  I reviewed her records, lab, scan and recommended her care plan.  This is a very pleasant 68 years old [name] with history of stage IIIb non-small cell lung cancer, adenocarcinoma diagnosed in March 2019 status post a course of concurrent chemoradiation with weekly carboplatin  and paclitaxel  followed by 1 year treatment with consolidation immunotherapy with Imfinzi  completed in May 2020 and the patient has been on observation since that time.  She is here today for evaluation with repeat CT scan of the chest.  I personally independently reviewed the scan and discussed the result with the patient  today.  Her scan showed no concerning findings for disease progression. I recommended for her to continue on observation with repeat CT scan of the chest in 1 year.  The patient was strongly advised to call immediately if she has any concerning symptoms in the interval. The total time spent in the appointment was 20 minutes. Disclaimer: This note was dictated with voice recognition software. Similar sounding words can inadvertently be transcribed and may be missed upon review. Aurelio Blower, MD

## 2023-06-16 ENCOUNTER — Inpatient Hospital Stay (HOSPITAL_BASED_OUTPATIENT_CLINIC_OR_DEPARTMENT_OTHER): Admitting: Physician Assistant

## 2023-06-16 VITALS — BP 138/65 | HR 81 | Temp 98.0°F | Resp 15 | Wt 203.9 lb

## 2023-06-16 DIAGNOSIS — C3411 Malignant neoplasm of upper lobe, right bronchus or lung: Secondary | ICD-10-CM

## 2023-06-16 DIAGNOSIS — Z9221 Personal history of antineoplastic chemotherapy: Secondary | ICD-10-CM | POA: Insufficient documentation

## 2023-06-16 DIAGNOSIS — C3412 Malignant neoplasm of upper lobe, left bronchus or lung: Secondary | ICD-10-CM | POA: Insufficient documentation

## 2023-06-16 DIAGNOSIS — Z923 Personal history of irradiation: Secondary | ICD-10-CM | POA: Insufficient documentation

## 2023-06-17 ENCOUNTER — Telehealth: Payer: Self-pay | Admitting: Internal Medicine

## 2023-06-17 NOTE — Telephone Encounter (Signed)
 Scheduled appointments around a CT scan expected date. The patient is aware of the appointment details and will be mailed an appointment reminder.

## 2024-05-31 ENCOUNTER — Other Ambulatory Visit

## 2024-06-07 ENCOUNTER — Ambulatory Visit: Admitting: Internal Medicine
